# Patient Record
Sex: Male | Born: 1945 | Race: White | Hispanic: No | Marital: Married | Smoking: Former smoker
Health system: Southern US, Community
[De-identification: ages and names within clinical notes are randomized; demographics above are authoritative.]

## PROBLEM LIST (undated history)

## (undated) DIAGNOSIS — T8859XA Other complications of anesthesia, initial encounter: Secondary | ICD-10-CM

## (undated) DIAGNOSIS — C679 Malignant neoplasm of bladder, unspecified: Secondary | ICD-10-CM

## (undated) DIAGNOSIS — G629 Polyneuropathy, unspecified: Secondary | ICD-10-CM

## (undated) DIAGNOSIS — E119 Type 2 diabetes mellitus without complications: Secondary | ICD-10-CM

## (undated) DIAGNOSIS — E7800 Pure hypercholesterolemia, unspecified: Secondary | ICD-10-CM

## (undated) DIAGNOSIS — M1990 Unspecified osteoarthritis, unspecified site: Secondary | ICD-10-CM

## (undated) DIAGNOSIS — Z87442 Personal history of urinary calculi: Secondary | ICD-10-CM

## (undated) DIAGNOSIS — I82409 Acute embolism and thrombosis of unspecified deep veins of unspecified lower extremity: Secondary | ICD-10-CM

## (undated) DIAGNOSIS — I10 Essential (primary) hypertension: Secondary | ICD-10-CM

## (undated) HISTORY — PX: KNEE ARTHROSCOPY: SUR90

## (undated) HISTORY — PX: CHOLECYSTECTOMY: SHX55

## (undated) HISTORY — PX: BLADDER SURGERY: SHX569

## (undated) HISTORY — PX: TONSILLECTOMY: SUR1361

## (undated) HISTORY — DX: Malignant neoplasm of bladder, unspecified: C67.9

## (undated) HISTORY — DX: Pure hypercholesterolemia, unspecified: E78.00

## (undated) HISTORY — DX: Essential (primary) hypertension: I10

## (undated) HISTORY — DX: Type 2 diabetes mellitus without complications: E11.9

## (undated) HISTORY — PX: OTHER SURGICAL HISTORY: SHX169

---

## 2003-08-28 ENCOUNTER — Other Ambulatory Visit: Admission: RE | Admit: 2003-08-28 | Discharge: 2003-08-28 | Payer: Self-pay | Admitting: Unknown Physician Specialty

## 2015-03-19 DIAGNOSIS — M9901 Segmental and somatic dysfunction of cervical region: Secondary | ICD-10-CM | POA: Diagnosis not present

## 2015-03-19 DIAGNOSIS — M9902 Segmental and somatic dysfunction of thoracic region: Secondary | ICD-10-CM | POA: Diagnosis not present

## 2015-03-19 DIAGNOSIS — M5441 Lumbago with sciatica, right side: Secondary | ICD-10-CM | POA: Diagnosis not present

## 2015-03-19 DIAGNOSIS — M4004 Postural kyphosis, thoracic region: Secondary | ICD-10-CM | POA: Diagnosis not present

## 2015-03-19 DIAGNOSIS — M5136 Other intervertebral disc degeneration, lumbar region: Secondary | ICD-10-CM | POA: Diagnosis not present

## 2015-03-19 DIAGNOSIS — M47812 Spondylosis without myelopathy or radiculopathy, cervical region: Secondary | ICD-10-CM | POA: Diagnosis not present

## 2015-03-19 DIAGNOSIS — M9903 Segmental and somatic dysfunction of lumbar region: Secondary | ICD-10-CM | POA: Diagnosis not present

## 2015-03-19 DIAGNOSIS — S39012A Strain of muscle, fascia and tendon of lower back, initial encounter: Secondary | ICD-10-CM | POA: Diagnosis not present

## 2015-03-19 DIAGNOSIS — M47816 Spondylosis without myelopathy or radiculopathy, lumbar region: Secondary | ICD-10-CM | POA: Diagnosis not present

## 2015-03-19 DIAGNOSIS — M546 Pain in thoracic spine: Secondary | ICD-10-CM | POA: Diagnosis not present

## 2015-03-19 DIAGNOSIS — M5442 Lumbago with sciatica, left side: Secondary | ICD-10-CM | POA: Diagnosis not present

## 2015-03-20 DIAGNOSIS — N401 Enlarged prostate with lower urinary tract symptoms: Secondary | ICD-10-CM | POA: Diagnosis not present

## 2015-03-20 DIAGNOSIS — R319 Hematuria, unspecified: Secondary | ICD-10-CM | POA: Diagnosis not present

## 2015-03-20 DIAGNOSIS — Z8551 Personal history of malignant neoplasm of bladder: Secondary | ICD-10-CM | POA: Diagnosis not present

## 2015-03-20 DIAGNOSIS — Z87442 Personal history of urinary calculi: Secondary | ICD-10-CM | POA: Diagnosis not present

## 2015-03-20 DIAGNOSIS — C671 Malignant neoplasm of dome of bladder: Secondary | ICD-10-CM | POA: Diagnosis not present

## 2015-03-20 DIAGNOSIS — N528 Other male erectile dysfunction: Secondary | ICD-10-CM | POA: Diagnosis not present

## 2015-03-20 DIAGNOSIS — R3129 Other microscopic hematuria: Secondary | ICD-10-CM | POA: Diagnosis not present

## 2015-03-24 DIAGNOSIS — R3129 Other microscopic hematuria: Secondary | ICD-10-CM | POA: Diagnosis not present

## 2015-03-26 DIAGNOSIS — C679 Malignant neoplasm of bladder, unspecified: Secondary | ICD-10-CM | POA: Diagnosis not present

## 2015-03-31 DIAGNOSIS — R3129 Other microscopic hematuria: Secondary | ICD-10-CM | POA: Diagnosis not present

## 2015-03-31 DIAGNOSIS — C671 Malignant neoplasm of dome of bladder: Secondary | ICD-10-CM | POA: Diagnosis not present

## 2015-04-02 DIAGNOSIS — C679 Malignant neoplasm of bladder, unspecified: Secondary | ICD-10-CM | POA: Diagnosis not present

## 2015-04-07 DIAGNOSIS — R3129 Other microscopic hematuria: Secondary | ICD-10-CM | POA: Diagnosis not present

## 2015-04-07 DIAGNOSIS — C679 Malignant neoplasm of bladder, unspecified: Secondary | ICD-10-CM | POA: Diagnosis not present

## 2015-04-09 DIAGNOSIS — C679 Malignant neoplasm of bladder, unspecified: Secondary | ICD-10-CM | POA: Diagnosis not present

## 2015-04-13 DIAGNOSIS — G4700 Insomnia, unspecified: Secondary | ICD-10-CM | POA: Diagnosis not present

## 2015-04-13 DIAGNOSIS — Z789 Other specified health status: Secondary | ICD-10-CM | POA: Diagnosis not present

## 2015-04-13 DIAGNOSIS — Z6829 Body mass index (BMI) 29.0-29.9, adult: Secondary | ICD-10-CM | POA: Diagnosis not present

## 2015-04-13 DIAGNOSIS — E1165 Type 2 diabetes mellitus with hyperglycemia: Secondary | ICD-10-CM | POA: Diagnosis not present

## 2015-05-18 DIAGNOSIS — M79672 Pain in left foot: Secondary | ICD-10-CM | POA: Diagnosis not present

## 2015-05-18 DIAGNOSIS — M25579 Pain in unspecified ankle and joints of unspecified foot: Secondary | ICD-10-CM | POA: Diagnosis not present

## 2015-06-15 DIAGNOSIS — M25579 Pain in unspecified ankle and joints of unspecified foot: Secondary | ICD-10-CM | POA: Diagnosis not present

## 2015-06-15 DIAGNOSIS — M79672 Pain in left foot: Secondary | ICD-10-CM | POA: Diagnosis not present

## 2015-07-07 DIAGNOSIS — R3129 Other microscopic hematuria: Secondary | ICD-10-CM | POA: Diagnosis not present

## 2015-07-07 DIAGNOSIS — N401 Enlarged prostate with lower urinary tract symptoms: Secondary | ICD-10-CM | POA: Diagnosis not present

## 2015-07-07 DIAGNOSIS — C671 Malignant neoplasm of dome of bladder: Secondary | ICD-10-CM | POA: Diagnosis not present

## 2015-07-07 DIAGNOSIS — N528 Other male erectile dysfunction: Secondary | ICD-10-CM | POA: Diagnosis not present

## 2015-07-07 DIAGNOSIS — R8290 Unspecified abnormal findings in urine: Secondary | ICD-10-CM | POA: Diagnosis not present

## 2015-07-20 DIAGNOSIS — M25579 Pain in unspecified ankle and joints of unspecified foot: Secondary | ICD-10-CM | POA: Diagnosis not present

## 2015-07-20 DIAGNOSIS — M79672 Pain in left foot: Secondary | ICD-10-CM | POA: Diagnosis not present

## 2015-07-27 DIAGNOSIS — E1165 Type 2 diabetes mellitus with hyperglycemia: Secondary | ICD-10-CM | POA: Diagnosis not present

## 2015-07-27 DIAGNOSIS — Z299 Encounter for prophylactic measures, unspecified: Secondary | ICD-10-CM | POA: Diagnosis not present

## 2015-07-27 DIAGNOSIS — G2581 Restless legs syndrome: Secondary | ICD-10-CM | POA: Diagnosis not present

## 2015-09-21 DIAGNOSIS — M25579 Pain in unspecified ankle and joints of unspecified foot: Secondary | ICD-10-CM | POA: Diagnosis not present

## 2015-09-21 DIAGNOSIS — M79672 Pain in left foot: Secondary | ICD-10-CM | POA: Diagnosis not present

## 2015-10-07 DIAGNOSIS — L820 Inflamed seborrheic keratosis: Secondary | ICD-10-CM | POA: Diagnosis not present

## 2015-10-07 DIAGNOSIS — L570 Actinic keratosis: Secondary | ICD-10-CM | POA: Diagnosis not present

## 2015-10-07 DIAGNOSIS — Z1283 Encounter for screening for malignant neoplasm of skin: Secondary | ICD-10-CM | POA: Diagnosis not present

## 2015-10-07 DIAGNOSIS — D239 Other benign neoplasm of skin, unspecified: Secondary | ICD-10-CM | POA: Diagnosis not present

## 2015-10-20 DIAGNOSIS — I10 Essential (primary) hypertension: Secondary | ICD-10-CM | POA: Diagnosis not present

## 2015-10-20 DIAGNOSIS — E119 Type 2 diabetes mellitus without complications: Secondary | ICD-10-CM | POA: Diagnosis not present

## 2015-10-21 DIAGNOSIS — E119 Type 2 diabetes mellitus without complications: Secondary | ICD-10-CM | POA: Diagnosis not present

## 2015-10-21 DIAGNOSIS — H2513 Age-related nuclear cataract, bilateral: Secondary | ICD-10-CM | POA: Diagnosis not present

## 2015-11-02 DIAGNOSIS — C679 Malignant neoplasm of bladder, unspecified: Secondary | ICD-10-CM | POA: Diagnosis not present

## 2015-11-02 DIAGNOSIS — E1165 Type 2 diabetes mellitus with hyperglycemia: Secondary | ICD-10-CM | POA: Diagnosis not present

## 2015-11-02 DIAGNOSIS — G2581 Restless legs syndrome: Secondary | ICD-10-CM | POA: Diagnosis not present

## 2015-11-17 DIAGNOSIS — M546 Pain in thoracic spine: Secondary | ICD-10-CM | POA: Diagnosis not present

## 2015-11-17 DIAGNOSIS — M9901 Segmental and somatic dysfunction of cervical region: Secondary | ICD-10-CM | POA: Diagnosis not present

## 2015-11-17 DIAGNOSIS — M5136 Other intervertebral disc degeneration, lumbar region: Secondary | ICD-10-CM | POA: Diagnosis not present

## 2015-11-17 DIAGNOSIS — M5441 Lumbago with sciatica, right side: Secondary | ICD-10-CM | POA: Diagnosis not present

## 2015-11-17 DIAGNOSIS — S39012A Strain of muscle, fascia and tendon of lower back, initial encounter: Secondary | ICD-10-CM | POA: Diagnosis not present

## 2015-11-17 DIAGNOSIS — M5442 Lumbago with sciatica, left side: Secondary | ICD-10-CM | POA: Diagnosis not present

## 2015-11-17 DIAGNOSIS — M47816 Spondylosis without myelopathy or radiculopathy, lumbar region: Secondary | ICD-10-CM | POA: Diagnosis not present

## 2015-11-17 DIAGNOSIS — M9903 Segmental and somatic dysfunction of lumbar region: Secondary | ICD-10-CM | POA: Diagnosis not present

## 2015-11-17 DIAGNOSIS — M47812 Spondylosis without myelopathy or radiculopathy, cervical region: Secondary | ICD-10-CM | POA: Diagnosis not present

## 2015-11-17 DIAGNOSIS — M4004 Postural kyphosis, thoracic region: Secondary | ICD-10-CM | POA: Diagnosis not present

## 2015-11-17 DIAGNOSIS — M9902 Segmental and somatic dysfunction of thoracic region: Secondary | ICD-10-CM | POA: Diagnosis not present

## 2015-11-18 DIAGNOSIS — M9901 Segmental and somatic dysfunction of cervical region: Secondary | ICD-10-CM | POA: Diagnosis not present

## 2015-11-18 DIAGNOSIS — M9902 Segmental and somatic dysfunction of thoracic region: Secondary | ICD-10-CM | POA: Diagnosis not present

## 2015-11-18 DIAGNOSIS — M5441 Lumbago with sciatica, right side: Secondary | ICD-10-CM | POA: Diagnosis not present

## 2015-11-18 DIAGNOSIS — S39012A Strain of muscle, fascia and tendon of lower back, initial encounter: Secondary | ICD-10-CM | POA: Diagnosis not present

## 2015-11-18 DIAGNOSIS — M47816 Spondylosis without myelopathy or radiculopathy, lumbar region: Secondary | ICD-10-CM | POA: Diagnosis not present

## 2015-11-18 DIAGNOSIS — M9903 Segmental and somatic dysfunction of lumbar region: Secondary | ICD-10-CM | POA: Diagnosis not present

## 2015-11-18 DIAGNOSIS — M5442 Lumbago with sciatica, left side: Secondary | ICD-10-CM | POA: Diagnosis not present

## 2015-11-18 DIAGNOSIS — M47812 Spondylosis without myelopathy or radiculopathy, cervical region: Secondary | ICD-10-CM | POA: Diagnosis not present

## 2015-11-18 DIAGNOSIS — M4004 Postural kyphosis, thoracic region: Secondary | ICD-10-CM | POA: Diagnosis not present

## 2015-11-18 DIAGNOSIS — M5136 Other intervertebral disc degeneration, lumbar region: Secondary | ICD-10-CM | POA: Diagnosis not present

## 2015-11-18 DIAGNOSIS — M546 Pain in thoracic spine: Secondary | ICD-10-CM | POA: Diagnosis not present

## 2015-11-20 DIAGNOSIS — M9903 Segmental and somatic dysfunction of lumbar region: Secondary | ICD-10-CM | POA: Diagnosis not present

## 2015-11-20 DIAGNOSIS — M5136 Other intervertebral disc degeneration, lumbar region: Secondary | ICD-10-CM | POA: Diagnosis not present

## 2015-11-20 DIAGNOSIS — M47812 Spondylosis without myelopathy or radiculopathy, cervical region: Secondary | ICD-10-CM | POA: Diagnosis not present

## 2015-11-20 DIAGNOSIS — M5442 Lumbago with sciatica, left side: Secondary | ICD-10-CM | POA: Diagnosis not present

## 2015-11-20 DIAGNOSIS — M47816 Spondylosis without myelopathy or radiculopathy, lumbar region: Secondary | ICD-10-CM | POA: Diagnosis not present

## 2015-11-20 DIAGNOSIS — M5441 Lumbago with sciatica, right side: Secondary | ICD-10-CM | POA: Diagnosis not present

## 2015-11-20 DIAGNOSIS — M546 Pain in thoracic spine: Secondary | ICD-10-CM | POA: Diagnosis not present

## 2015-11-20 DIAGNOSIS — M9902 Segmental and somatic dysfunction of thoracic region: Secondary | ICD-10-CM | POA: Diagnosis not present

## 2015-11-20 DIAGNOSIS — M9901 Segmental and somatic dysfunction of cervical region: Secondary | ICD-10-CM | POA: Diagnosis not present

## 2015-11-20 DIAGNOSIS — S39012A Strain of muscle, fascia and tendon of lower back, initial encounter: Secondary | ICD-10-CM | POA: Diagnosis not present

## 2015-11-20 DIAGNOSIS — M4004 Postural kyphosis, thoracic region: Secondary | ICD-10-CM | POA: Diagnosis not present

## 2015-11-27 DIAGNOSIS — M47812 Spondylosis without myelopathy or radiculopathy, cervical region: Secondary | ICD-10-CM | POA: Diagnosis not present

## 2015-11-27 DIAGNOSIS — M546 Pain in thoracic spine: Secondary | ICD-10-CM | POA: Diagnosis not present

## 2015-11-27 DIAGNOSIS — M9902 Segmental and somatic dysfunction of thoracic region: Secondary | ICD-10-CM | POA: Diagnosis not present

## 2015-11-27 DIAGNOSIS — M47816 Spondylosis without myelopathy or radiculopathy, lumbar region: Secondary | ICD-10-CM | POA: Diagnosis not present

## 2015-11-27 DIAGNOSIS — M9903 Segmental and somatic dysfunction of lumbar region: Secondary | ICD-10-CM | POA: Diagnosis not present

## 2015-11-27 DIAGNOSIS — M5442 Lumbago with sciatica, left side: Secondary | ICD-10-CM | POA: Diagnosis not present

## 2015-11-27 DIAGNOSIS — M5441 Lumbago with sciatica, right side: Secondary | ICD-10-CM | POA: Diagnosis not present

## 2015-11-27 DIAGNOSIS — M5136 Other intervertebral disc degeneration, lumbar region: Secondary | ICD-10-CM | POA: Diagnosis not present

## 2015-11-27 DIAGNOSIS — S39012A Strain of muscle, fascia and tendon of lower back, initial encounter: Secondary | ICD-10-CM | POA: Diagnosis not present

## 2015-11-27 DIAGNOSIS — M4004 Postural kyphosis, thoracic region: Secondary | ICD-10-CM | POA: Diagnosis not present

## 2015-11-27 DIAGNOSIS — M9901 Segmental and somatic dysfunction of cervical region: Secondary | ICD-10-CM | POA: Diagnosis not present

## 2015-12-15 DIAGNOSIS — M25579 Pain in unspecified ankle and joints of unspecified foot: Secondary | ICD-10-CM | POA: Diagnosis not present

## 2015-12-15 DIAGNOSIS — M79672 Pain in left foot: Secondary | ICD-10-CM | POA: Diagnosis not present

## 2016-01-04 DIAGNOSIS — I10 Essential (primary) hypertension: Secondary | ICD-10-CM | POA: Diagnosis not present

## 2016-01-04 DIAGNOSIS — E119 Type 2 diabetes mellitus without complications: Secondary | ICD-10-CM | POA: Diagnosis not present

## 2016-01-08 DIAGNOSIS — N528 Other male erectile dysfunction: Secondary | ICD-10-CM | POA: Diagnosis not present

## 2016-01-08 DIAGNOSIS — N401 Enlarged prostate with lower urinary tract symptoms: Secondary | ICD-10-CM | POA: Diagnosis not present

## 2016-01-08 DIAGNOSIS — C671 Malignant neoplasm of dome of bladder: Secondary | ICD-10-CM | POA: Diagnosis not present

## 2016-01-08 DIAGNOSIS — N200 Calculus of kidney: Secondary | ICD-10-CM | POA: Diagnosis not present

## 2016-01-08 DIAGNOSIS — R3129 Other microscopic hematuria: Secondary | ICD-10-CM | POA: Diagnosis not present

## 2016-01-08 DIAGNOSIS — R351 Nocturia: Secondary | ICD-10-CM | POA: Diagnosis not present

## 2016-02-12 DIAGNOSIS — Z6828 Body mass index (BMI) 28.0-28.9, adult: Secondary | ICD-10-CM | POA: Diagnosis not present

## 2016-02-12 DIAGNOSIS — Z299 Encounter for prophylactic measures, unspecified: Secondary | ICD-10-CM | POA: Diagnosis not present

## 2016-02-12 DIAGNOSIS — Z713 Dietary counseling and surveillance: Secondary | ICD-10-CM | POA: Diagnosis not present

## 2016-02-12 DIAGNOSIS — E1165 Type 2 diabetes mellitus with hyperglycemia: Secondary | ICD-10-CM | POA: Diagnosis not present

## 2016-02-12 DIAGNOSIS — I10 Essential (primary) hypertension: Secondary | ICD-10-CM | POA: Diagnosis not present

## 2016-02-23 DIAGNOSIS — M79672 Pain in left foot: Secondary | ICD-10-CM | POA: Diagnosis not present

## 2016-02-23 DIAGNOSIS — M25579 Pain in unspecified ankle and joints of unspecified foot: Secondary | ICD-10-CM | POA: Diagnosis not present

## 2016-03-10 DIAGNOSIS — E119 Type 2 diabetes mellitus without complications: Secondary | ICD-10-CM | POA: Diagnosis not present

## 2016-03-10 DIAGNOSIS — I10 Essential (primary) hypertension: Secondary | ICD-10-CM | POA: Diagnosis not present

## 2016-04-14 DIAGNOSIS — I10 Essential (primary) hypertension: Secondary | ICD-10-CM | POA: Diagnosis not present

## 2016-04-14 DIAGNOSIS — E119 Type 2 diabetes mellitus without complications: Secondary | ICD-10-CM | POA: Diagnosis not present

## 2016-05-26 DIAGNOSIS — Z1389 Encounter for screening for other disorder: Secondary | ICD-10-CM | POA: Diagnosis not present

## 2016-05-26 DIAGNOSIS — R5383 Other fatigue: Secondary | ICD-10-CM | POA: Diagnosis not present

## 2016-05-26 DIAGNOSIS — E7800 Pure hypercholesterolemia, unspecified: Secondary | ICD-10-CM | POA: Diagnosis not present

## 2016-05-26 DIAGNOSIS — Z79899 Other long term (current) drug therapy: Secondary | ICD-10-CM | POA: Diagnosis not present

## 2016-05-26 DIAGNOSIS — Z299 Encounter for prophylactic measures, unspecified: Secondary | ICD-10-CM | POA: Diagnosis not present

## 2016-05-26 DIAGNOSIS — Z7189 Other specified counseling: Secondary | ICD-10-CM | POA: Diagnosis not present

## 2016-05-26 DIAGNOSIS — Z0000 Encounter for general adult medical examination without abnormal findings: Secondary | ICD-10-CM | POA: Diagnosis not present

## 2016-05-26 DIAGNOSIS — I10 Essential (primary) hypertension: Secondary | ICD-10-CM | POA: Diagnosis not present

## 2016-05-26 DIAGNOSIS — E1165 Type 2 diabetes mellitus with hyperglycemia: Secondary | ICD-10-CM | POA: Diagnosis not present

## 2016-05-26 DIAGNOSIS — Z1211 Encounter for screening for malignant neoplasm of colon: Secondary | ICD-10-CM | POA: Diagnosis not present

## 2016-05-26 DIAGNOSIS — R079 Chest pain, unspecified: Secondary | ICD-10-CM | POA: Diagnosis not present

## 2016-05-26 DIAGNOSIS — Z6827 Body mass index (BMI) 27.0-27.9, adult: Secondary | ICD-10-CM | POA: Diagnosis not present

## 2016-05-31 DIAGNOSIS — M531 Cervicobrachial syndrome: Secondary | ICD-10-CM | POA: Diagnosis not present

## 2016-05-31 DIAGNOSIS — M47816 Spondylosis without myelopathy or radiculopathy, lumbar region: Secondary | ICD-10-CM | POA: Diagnosis not present

## 2016-05-31 DIAGNOSIS — M9901 Segmental and somatic dysfunction of cervical region: Secondary | ICD-10-CM | POA: Diagnosis not present

## 2016-05-31 DIAGNOSIS — M9903 Segmental and somatic dysfunction of lumbar region: Secondary | ICD-10-CM | POA: Diagnosis not present

## 2016-05-31 DIAGNOSIS — M4004 Postural kyphosis, thoracic region: Secondary | ICD-10-CM | POA: Diagnosis not present

## 2016-05-31 DIAGNOSIS — M47812 Spondylosis without myelopathy or radiculopathy, cervical region: Secondary | ICD-10-CM | POA: Diagnosis not present

## 2016-05-31 DIAGNOSIS — M9902 Segmental and somatic dysfunction of thoracic region: Secondary | ICD-10-CM | POA: Diagnosis not present

## 2016-06-06 DIAGNOSIS — R079 Chest pain, unspecified: Secondary | ICD-10-CM | POA: Diagnosis not present

## 2016-06-20 DIAGNOSIS — R195 Other fecal abnormalities: Secondary | ICD-10-CM | POA: Diagnosis not present

## 2016-06-20 DIAGNOSIS — Z299 Encounter for prophylactic measures, unspecified: Secondary | ICD-10-CM | POA: Diagnosis not present

## 2016-06-20 DIAGNOSIS — I10 Essential (primary) hypertension: Secondary | ICD-10-CM | POA: Diagnosis not present

## 2016-06-20 DIAGNOSIS — Z6827 Body mass index (BMI) 27.0-27.9, adult: Secondary | ICD-10-CM | POA: Diagnosis not present

## 2016-06-20 DIAGNOSIS — E1165 Type 2 diabetes mellitus with hyperglycemia: Secondary | ICD-10-CM | POA: Diagnosis not present

## 2016-06-20 DIAGNOSIS — Z789 Other specified health status: Secondary | ICD-10-CM | POA: Diagnosis not present

## 2016-06-21 ENCOUNTER — Other Ambulatory Visit (INDEPENDENT_AMBULATORY_CARE_PROVIDER_SITE_OTHER): Payer: Self-pay | Admitting: Internal Medicine

## 2016-06-28 ENCOUNTER — Encounter (INDEPENDENT_AMBULATORY_CARE_PROVIDER_SITE_OTHER): Payer: Self-pay | Admitting: Internal Medicine

## 2016-06-29 ENCOUNTER — Telehealth (INDEPENDENT_AMBULATORY_CARE_PROVIDER_SITE_OTHER): Payer: Self-pay | Admitting: *Deleted

## 2016-06-29 ENCOUNTER — Encounter (INDEPENDENT_AMBULATORY_CARE_PROVIDER_SITE_OTHER): Payer: Self-pay | Admitting: *Deleted

## 2016-06-29 ENCOUNTER — Encounter (INDEPENDENT_AMBULATORY_CARE_PROVIDER_SITE_OTHER): Payer: Self-pay | Admitting: Internal Medicine

## 2016-06-29 ENCOUNTER — Other Ambulatory Visit (INDEPENDENT_AMBULATORY_CARE_PROVIDER_SITE_OTHER): Payer: Self-pay | Admitting: Internal Medicine

## 2016-06-29 ENCOUNTER — Ambulatory Visit (INDEPENDENT_AMBULATORY_CARE_PROVIDER_SITE_OTHER): Payer: Medicare Other | Admitting: Internal Medicine

## 2016-06-29 ENCOUNTER — Encounter (INDEPENDENT_AMBULATORY_CARE_PROVIDER_SITE_OTHER): Payer: Self-pay

## 2016-06-29 VITALS — BP 120/80 | HR 64 | Temp 97.5°F | Ht 70.5 in | Wt 190.5 lb

## 2016-06-29 DIAGNOSIS — C679 Malignant neoplasm of bladder, unspecified: Secondary | ICD-10-CM

## 2016-06-29 DIAGNOSIS — I10 Essential (primary) hypertension: Secondary | ICD-10-CM | POA: Insufficient documentation

## 2016-06-29 DIAGNOSIS — E119 Type 2 diabetes mellitus without complications: Secondary | ICD-10-CM

## 2016-06-29 DIAGNOSIS — R195 Other fecal abnormalities: Secondary | ICD-10-CM

## 2016-06-29 DIAGNOSIS — E7800 Pure hypercholesterolemia, unspecified: Secondary | ICD-10-CM

## 2016-06-29 HISTORY — DX: Malignant neoplasm of bladder, unspecified: C67.9

## 2016-06-29 HISTORY — DX: Essential (primary) hypertension: I10

## 2016-06-29 HISTORY — DX: Pure hypercholesterolemia, unspecified: E78.00

## 2016-06-29 HISTORY — DX: Type 2 diabetes mellitus without complications: E11.9

## 2016-06-29 MED ORDER — PEG 3350-KCL-NA BICARB-NACL 420 G PO SOLR: 4000.0000 mL | Freq: Once | ORAL | 0 refills | Status: AC

## 2016-06-29 NOTE — Telephone Encounter (Signed)
Patient needs trilyte 

## 2016-06-29 NOTE — Progress Notes (Signed)
   Subjective:    Patient ID: Eric [name], male    DOB: 03-12-1946, 71 y.o.   MRN: [id number]  HPI Referred by Dr. Manuella Ghazi for positive stool card. Last colonoscopy in 2011 by Dr. Laural Golden which revealed pancolonic diverticulosis. He denies any blood. No change in his stools. Sometimes his stools are a little darker. Stools are not black.  No family hx of colon cancer. Appetite is good. No weight loss. Usually has a BM daily.  No family hx of colon cancer.    05/27/2016 H and H 13.6 and 39.3  Colonoscopy in 2006 by Dr. Lindalou Hose. Moderate diverticulosis.  No polyps or other neoplasms.  Review of Systems Past Medical History:  Diagnosis Date  . Bladder cancer ([place]) 06/29/2016   2013  . Diabetes ([place]) 06/29/2016  . Essential hypertension, benign 06/29/2016  . High cholesterol 06/29/2016    Past Surgical History:  Procedure Laterality Date  . BLADDER SURGERY     2013  . CHOLECYSTECTOMY    . knee athroscopy      No Known Allergies  Current Outpatient Prescriptions on File Prior to Visit  Medication Sig Dispense Refill  . Cinnamon 500 MG TABS Take 1,000 mg by mouth daily.     . Multiple Vitamins-Minerals (MULTIVITAMIN ADULT PO) Take 1 tablet by mouth daily.    . Potassium 75 MG TABS Take 75 mg by mouth daily.    Marland Kitchen rOPINIRole (REQUIP) 1 MG tablet Take 1 mg by mouth daily.     No current facility-administered medications on file prior to visit.        Objective:   Physical Exam Blood pressure 120/80, pulse 64, temperature 97.5 F (36.4 C), height 5' 10.5" (1.791 m), weight 190 lb 8 oz (86.4 kg).  Alert and oriented. Skin warm and dry. Oral mucosa is moist.   . Sclera anicteric, conjunctivae is pink. Thyroid not enlarged. No cervical lymphadenopathy. Lungs clear. Heart regular rate and rhythm.  Abdomen is soft. Bowel sounds are positive. No hepatomegaly. No abdominal masses felt. No tenderness.  No edema to lower extremities.         Assessment & Plan:  Guaiac positive  stool.  Colonic neoplasm, AVM, polyp, needs to be ruled out.  The risks and benefits such as perforation, bleeding, and infection were reviewed with the patient and is agreeable.

## 2016-06-29 NOTE — Patient Instructions (Signed)
Colonoscopy.  The risks and benefits such as perforation, bleeding, and infection were reviewed with the patient and is agreeable. 

## 2016-07-01 DIAGNOSIS — M9901 Segmental and somatic dysfunction of cervical region: Secondary | ICD-10-CM | POA: Diagnosis not present

## 2016-07-01 DIAGNOSIS — M9902 Segmental and somatic dysfunction of thoracic region: Secondary | ICD-10-CM | POA: Diagnosis not present

## 2016-07-01 DIAGNOSIS — M4004 Postural kyphosis, thoracic region: Secondary | ICD-10-CM | POA: Diagnosis not present

## 2016-07-01 DIAGNOSIS — M47812 Spondylosis without myelopathy or radiculopathy, cervical region: Secondary | ICD-10-CM | POA: Diagnosis not present

## 2016-07-01 DIAGNOSIS — M531 Cervicobrachial syndrome: Secondary | ICD-10-CM | POA: Diagnosis not present

## 2016-07-01 DIAGNOSIS — M47816 Spondylosis without myelopathy or radiculopathy, lumbar region: Secondary | ICD-10-CM | POA: Diagnosis not present

## 2016-07-01 DIAGNOSIS — M9903 Segmental and somatic dysfunction of lumbar region: Secondary | ICD-10-CM | POA: Diagnosis not present

## 2016-07-04 ENCOUNTER — Encounter (INDEPENDENT_AMBULATORY_CARE_PROVIDER_SITE_OTHER): Payer: Self-pay

## 2016-07-06 DIAGNOSIS — I10 Essential (primary) hypertension: Secondary | ICD-10-CM | POA: Diagnosis not present

## 2016-07-06 DIAGNOSIS — E119 Type 2 diabetes mellitus without complications: Secondary | ICD-10-CM | POA: Diagnosis not present

## 2016-07-08 DIAGNOSIS — N401 Enlarged prostate with lower urinary tract symptoms: Secondary | ICD-10-CM | POA: Diagnosis not present

## 2016-07-08 DIAGNOSIS — R3129 Other microscopic hematuria: Secondary | ICD-10-CM | POA: Diagnosis not present

## 2016-07-08 DIAGNOSIS — R351 Nocturia: Secondary | ICD-10-CM | POA: Diagnosis not present

## 2016-07-08 DIAGNOSIS — C671 Malignant neoplasm of dome of bladder: Secondary | ICD-10-CM | POA: Diagnosis not present

## 2016-07-08 DIAGNOSIS — N528 Other male erectile dysfunction: Secondary | ICD-10-CM | POA: Diagnosis not present

## 2016-07-08 DIAGNOSIS — N200 Calculus of kidney: Secondary | ICD-10-CM | POA: Diagnosis not present

## 2016-07-21 DIAGNOSIS — N401 Enlarged prostate with lower urinary tract symptoms: Secondary | ICD-10-CM | POA: Diagnosis not present

## 2016-07-21 DIAGNOSIS — C671 Malignant neoplasm of dome of bladder: Secondary | ICD-10-CM | POA: Diagnosis not present

## 2016-07-29 DIAGNOSIS — E119 Type 2 diabetes mellitus without complications: Secondary | ICD-10-CM | POA: Diagnosis not present

## 2016-07-29 DIAGNOSIS — I10 Essential (primary) hypertension: Secondary | ICD-10-CM | POA: Diagnosis not present

## 2016-09-01 DIAGNOSIS — E7800 Pure hypercholesterolemia, unspecified: Secondary | ICD-10-CM | POA: Diagnosis not present

## 2016-09-01 DIAGNOSIS — C679 Malignant neoplasm of bladder, unspecified: Secondary | ICD-10-CM | POA: Diagnosis not present

## 2016-09-01 DIAGNOSIS — Z299 Encounter for prophylactic measures, unspecified: Secondary | ICD-10-CM | POA: Diagnosis not present

## 2016-09-01 DIAGNOSIS — Z6826 Body mass index (BMI) 26.0-26.9, adult: Secondary | ICD-10-CM | POA: Diagnosis not present

## 2016-09-01 DIAGNOSIS — G4700 Insomnia, unspecified: Secondary | ICD-10-CM | POA: Diagnosis not present

## 2016-09-01 DIAGNOSIS — E1165 Type 2 diabetes mellitus with hyperglycemia: Secondary | ICD-10-CM | POA: Diagnosis not present

## 2016-09-01 DIAGNOSIS — N400 Enlarged prostate without lower urinary tract symptoms: Secondary | ICD-10-CM | POA: Diagnosis not present

## 2016-09-01 DIAGNOSIS — G2581 Restless legs syndrome: Secondary | ICD-10-CM | POA: Diagnosis not present

## 2016-09-01 DIAGNOSIS — Z713 Dietary counseling and surveillance: Secondary | ICD-10-CM | POA: Diagnosis not present

## 2016-09-01 DIAGNOSIS — I10 Essential (primary) hypertension: Secondary | ICD-10-CM | POA: Diagnosis not present

## 2016-09-19 DIAGNOSIS — M79672 Pain in left foot: Secondary | ICD-10-CM | POA: Diagnosis not present

## 2016-09-19 DIAGNOSIS — M79675 Pain in left toe(s): Secondary | ICD-10-CM | POA: Diagnosis not present

## 2016-09-19 DIAGNOSIS — M25579 Pain in unspecified ankle and joints of unspecified foot: Secondary | ICD-10-CM | POA: Diagnosis not present

## 2016-09-29 ENCOUNTER — Ambulatory Visit (HOSPITAL_COMMUNITY)
Admission: RE | Admit: 2016-09-29 | Discharge: 2016-09-29 | Disposition: A | Discharge status: Home / Self Care | Payer: Medicare Other | Source: Ambulatory Visit | Attending: Internal Medicine | Admitting: Internal Medicine

## 2016-09-29 ENCOUNTER — Encounter (HOSPITAL_COMMUNITY)
Admission: RE | Disposition: A | Discharge status: Home / Self Care | Payer: Self-pay | Source: Ambulatory Visit | Attending: Internal Medicine

## 2016-09-29 ENCOUNTER — Encounter (HOSPITAL_COMMUNITY): Payer: Self-pay | Admitting: *Deleted

## 2016-09-29 DIAGNOSIS — K644 Residual hemorrhoidal skin tags: Secondary | ICD-10-CM | POA: Insufficient documentation

## 2016-09-29 DIAGNOSIS — R195 Other fecal abnormalities: Secondary | ICD-10-CM | POA: Insufficient documentation

## 2016-09-29 DIAGNOSIS — K633 Ulcer of intestine: Secondary | ICD-10-CM | POA: Diagnosis not present

## 2016-09-29 DIAGNOSIS — Z87442 Personal history of urinary calculi: Secondary | ICD-10-CM | POA: Insufficient documentation

## 2016-09-29 DIAGNOSIS — Z79899 Other long term (current) drug therapy: Secondary | ICD-10-CM | POA: Diagnosis not present

## 2016-09-29 DIAGNOSIS — Z8551 Personal history of malignant neoplasm of bladder: Secondary | ICD-10-CM | POA: Diagnosis not present

## 2016-09-29 DIAGNOSIS — K5730 Diverticulosis of large intestine without perforation or abscess without bleeding: Secondary | ICD-10-CM | POA: Diagnosis not present

## 2016-09-29 DIAGNOSIS — I10 Essential (primary) hypertension: Secondary | ICD-10-CM | POA: Diagnosis not present

## 2016-09-29 DIAGNOSIS — E7800 Pure hypercholesterolemia, unspecified: Secondary | ICD-10-CM | POA: Diagnosis not present

## 2016-09-29 DIAGNOSIS — E119 Type 2 diabetes mellitus without complications: Secondary | ICD-10-CM | POA: Diagnosis not present

## 2016-09-29 HISTORY — PX: COLONOSCOPY: SHX5424

## 2016-09-29 HISTORY — DX: Personal history of urinary calculi: Z87.442

## 2016-09-29 HISTORY — PX: BIOPSY: SHX5522

## 2016-09-29 LAB — GLUCOSE, CAPILLARY: GLUCOSE-CAPILLARY: 111 mg/dL — AB (ref 65–99)

## 2016-09-29 SURGERY — COLONOSCOPY
Anesthesia: Moderate Sedation

## 2016-09-29 MED ORDER — MEPERIDINE HCL 50 MG/ML IJ SOLN
INTRAMUSCULAR | Status: DC | PRN
Start: 2016-09-29 — End: 2016-09-29
  Administered 2016-09-29 (×2): 25 mg via INTRAVENOUS

## 2016-09-29 MED ORDER — MIDAZOLAM HCL 5 MG/5ML IJ SOLN
INTRAMUSCULAR | Status: AC
  Filled 2016-09-29: qty 10

## 2016-09-29 MED ORDER — MIDAZOLAM HCL 5 MG/5ML IJ SOLN
INTRAMUSCULAR | Status: DC | PRN
  Administered 2016-09-29: 1 mg via INTRAVENOUS
  Administered 2016-09-29 (×2): 2 mg via INTRAVENOUS

## 2016-09-29 MED ORDER — MEPERIDINE HCL 50 MG/ML IJ SOLN
INTRAMUSCULAR | Status: DC
Start: 2016-09-29 — End: 2016-09-29
  Filled 2016-09-29: qty 1

## 2016-09-29 MED ORDER — SODIUM CHLORIDE 0.9 % IV SOLN
INTRAVENOUS | Status: DC
  Administered 2016-09-29: 11:00:00 via INTRAVENOUS

## 2016-09-29 MED ORDER — STERILE WATER FOR IRRIGATION IR SOLN
Status: DC | PRN
  Administered 2016-09-29: 11:00:00

## 2016-09-29 NOTE — H&P (Signed)
Eric [name] is an 71 y.o. male.   Chief Complaint: Patient is here for colonoscopy. HPI: Patient is 71 year old Caucasian male who was recently noted to have heme-positive stool and is therefore here for diagnostic colonoscopy. He he has sporadic blood with a bowel movement in the form of blood in the tissue. He denies frank rectal bleeding melena abdominal pain. He has back pain and takes Aleve on as-needed basis. He takes 2 at a time that he does not take it every day. Last colonoscopy was in May 2011. It was negative for colonic adenomas.  Past Medical History:  Diagnosis Date  . Bladder cancer ([place]) 06/29/2016   2013  . Diabetes ([place]) 06/29/2016  . Essential hypertension, benign 06/29/2016  . High cholesterol 06/29/2016  . History of kidney stones     Past Surgical History:  Procedure Laterality Date  . BLADDER SURGERY     2013  . CHOLECYSTECTOMY    . knee athroscopy      Family History  Problem Relation Age of Onset  . Colon cancer Neg Hx    Social History:  reports that he has never smoked. He has never used smokeless tobacco. He reports that he drinks alcohol. He reports that he does not use drugs.  Allergies: No Known Allergies  Medications Prior to Admission  Medication Sig Dispense Refill  . amLODipine (NORVASC) 5 MG tablet Take 5 mg by mouth daily.    Marland Kitchen CINNAMON PO Take 1,000 mg by mouth daily.    Marland Kitchen dutasteride (AVODART) 0.5 MG capsule Take 0.5 mg by mouth daily.    Marland Kitchen ibuprofen (ADVIL,MOTRIN) 200 MG tablet Take 800 mg by mouth daily as needed for headache or moderate pain.    Marland Kitchen lisinopril (PRINIVIL,ZESTRIL) 20 MG tablet Take 20 mg by mouth daily.    Marland Kitchen lovastatin (MEVACOR) 10 MG tablet Take 10 mg by mouth daily with supper.     . magnesium oxide (MAG-OX) 400 MG tablet Take 800 mg by mouth 2 (two) times daily.     . Multiple Vitamins-Minerals (MULTIVITAMIN ADULT PO) Take 1 tablet by mouth daily.    . naproxen sodium (ANAPROX) 220 MG tablet Take 440 mg by mouth  daily as needed (pain).    . Potassium 75 MG TABS Take 75 mg by mouth daily.     Marland Kitchen rOPINIRole (REQUIP) 1 MG tablet Take 1 mg by mouth at bedtime.       Results for orders placed or performed during the hospital encounter of 09/29/16 (from the past 48 hour(s))  Glucose, capillary     Status: Abnormal   Collection Time: 09/29/16 10:46 AM  Result Value Ref Range   Glucose-Capillary 111 (H) 65 - 99 mg/dL   No results found.  ROS  Blood pressure (!) 157/71, pulse (!) 59, temperature 97.9 F (36.6 C), temperature source Oral, resp. rate 18, height 5\' 10"  (1.778 m), weight 190 lb (86.2 kg), SpO2 100 %. Physical Exam  Constitutional: He appears well-developed and well-nourished.  HENT:  Mouth/Throat: Oropharynx is clear and moist.  Eyes: Conjunctivae are normal. No scleral icterus.  Neck: No thyromegaly present.  Cardiovascular: Normal rate, regular rhythm and normal heart sounds.   No murmur heard. Respiratory: Effort normal and breath sounds normal.  GI: Soft. He exhibits no distension and no mass. There is no tenderness.  Musculoskeletal: He exhibits no edema.  Lymphadenopathy:    He has no cervical adenopathy.  Neurological: He is alert.  Skin: Skin is warm and  dry.     Assessment/Plan Heme-positive stool. Diagnostic colonoscopy.  Hildred Laser, MD 09/29/2016, 11:17 AM

## 2016-09-29 NOTE — Discharge Instructions (Signed)
° °  Gastrointestinal Bleeding Gastrointestinal bleeding is bleeding somewhere along the path food travels through the body (digestive tract). This path is anywhere between the mouth and the opening of the butt (anus). You may have blood in your poop (stools) or have black poop. If you throw up (vomit), there may be blood in it. This condition can be mild, serious, or even life-threatening. If you have a lot of bleeding, you may need to stay in the hospital. Follow these instructions at home:  Take over-the-counter and prescription medicines only as told by your doctor.  Eat foods that have a lot of fiber in them. These foods include whole grains, fruits, and vegetables. You can also try eating 1-3 prunes each day.  Drink enough fluid to keep your pee (urine) clear or pale yellow.  Keep all follow-up visits as told by your doctor. This is important. Contact a doctor if:  Your symptoms do not get better. Get help right away if:  Your bleeding gets worse.  You feel dizzy or you pass out (faint).  You feel weak.  You have very bad cramps in your back or belly (abdomen).  You pass large clumps of blood (clots) in your poop.  Your symptoms are getting worse. This information is not intended to replace advice given to you by your health care provider. Make sure you discuss any questions you have with your health care provider. Document Released: 12/08/2007 Document Revised: 08/06/2015 Document Reviewed: 08/18/2014 Elsevier Interactive Patient Education  2018 [place] or ibuprofen for an milligrams up to 3 times a day as needed and do not take Aleve if possible. Resume other medications and high fiber diet. No driving for 24 hours. Physician will call with biopsy results.

## 2016-09-29 NOTE — Op Note (Signed)
[facility] Patient Name: Eric [name] Procedure Date: 09/29/2016 11:09 AM MRN: [id number] Date of Birth: 05-Aug-1945 Attending MD: Hildred Laser , MD CSN: [id number] Age: 71 Admit Type: Outpatient Procedure:                Colonoscopy Indications:              Heme positive stool Providers:                Hildred Laser, MD, Otis Peak B. Sharon Seller, RN, Bonnetta Barry, Technician Referring MD:             Fuller Canada Manuella Ghazi, MD Medicines:                Meperidine 50 mg IV, Midazolam 5 mg IV Complications:            No immediate complications. Estimated Blood Loss:     Estimated blood loss was minimal. Procedure:                Pre-Anesthesia Assessment:                           - Prior to the procedure, a History and Physical                            was performed, and patient medications and                            allergies were reviewed. The patient's tolerance of                            previous anesthesia was also reviewed. The risks                            and benefits of the procedure and the sedation                            options and risks were discussed with the patient.                            All questions were answered, and informed consent                            was obtained. Prior Anticoagulants: The patient                            last took naproxen 7 days prior to the procedure.                            ASA Grade Assessment: II - A patient with mild                            systemic disease. After reviewing the risks and  benefits, the patient was deemed in satisfactory                            condition to undergo the procedure.                           After obtaining informed consent, the colonoscope                            was passed under direct vision. Throughout the                            procedure, the patient's blood pressure, pulse, and   oxygen saturations were monitored continuously. The                            EC-[id number] ([id number]) scope was introduced through                            the anus and advanced to the the cecum, identified                            by appendiceal orifice and ileocecal valve. The                            colonoscopy was performed without difficulty. The                            patient tolerated the procedure well. The quality                            of the bowel preparation was adequate. The                            ileocecal valve, appendiceal orifice, and rectum                            were photographed. Scope In: 11:29:48 AM Scope Out: 11:53:07 AM Scope Withdrawal Time: 0 hours 10 minutes 8 seconds  Total Procedure Duration: 0 hours 23 minutes 19 seconds  Findings:      The perianal and digital rectal examinations were normal.      Multiple small and large-mouthed diverticula were found in the entire       colon.      A single (solitary) ulcer was found in the distal transverse colon. No       bleeding was present. Biopsies were taken with a cold forceps for       histology.      A few scattered erosions were found in the transverse colon and in the       ascending colon.      External hemorrhoids were found during retroflexion. The hemorrhoids       were small. Impression:               - Diverticulosis in the entire examined colon.                           -  A single (solitary) ulcer in the distal                            transverse colon. Biopsied.                           - A few erosions in the transverse colon and in the                            ascending colon.                           - External hemorrhoids.                           comment: Suspect NSAID induced injury. Moderate Sedation:      Moderate (conscious) sedation was administered by the endoscopy nurse       and supervised by the endoscopist. The following parameters were       monitored:  oxygen saturation, heart rate, blood pressure, CO2       capnography and response to care. Total physician intraservice time was       30 minutes. Recommendation:           - Patient has a contact number available for                            emergencies. The signs and symptoms of potential                            delayed complications were discussed with the                            patient. Return to normal activities tomorrow.                            Written discharge instructions were provided to the                            patient.                           - High fiber diet today.                           - Continue present medications.                           - Await pathology results.                           - No repeat colonoscopy due to the absence of                            advanced adenomas.                           - Use ibuprofen instead of  naproxen. Procedure Code(s):        --- Professional ---                           [phone], Colonoscopy, flexible; with biopsy, single                            or multiple                           99152, Moderate sedation services provided by the                            same physician or other qualified health care                            professional performing the diagnostic or                            therapeutic service that the sedation supports,                            requiring the presence of an independent trained                            observer to assist in the monitoring of the                            patient's level of consciousness and physiological                            status; initial 15 minutes of intraservice time,                            patient age 40 years or older                           [phone], Moderate sedation services; each additional                            15 minutes intraservice time Diagnosis Code(s):        --- Professional ---                            K64.4, Residual hemorrhoidal skin tags                           K63.3, Ulcer of intestine                           R19.5, Other fecal abnormalities                           K57.30, Diverticulosis of large intestine without  perforation or abscess without bleeding CPT copyright 2016 American Medical Association. All rights reserved. The codes documented in this report are preliminary and upon coder review may  be revised to meet current compliance requirements. Hildred Laser, MD Hildred Laser, MD 09/29/2016 12:02:45 PM This report has been signed electronically. Number of Addenda: 0

## 2016-10-04 ENCOUNTER — Encounter (HOSPITAL_COMMUNITY): Payer: Self-pay | Admitting: Internal Medicine

## 2016-10-05 DIAGNOSIS — D239 Other benign neoplasm of skin, unspecified: Secondary | ICD-10-CM | POA: Diagnosis not present

## 2016-10-05 DIAGNOSIS — L720 Epidermal cyst: Secondary | ICD-10-CM | POA: Diagnosis not present

## 2016-10-05 DIAGNOSIS — Z1283 Encounter for screening for malignant neoplasm of skin: Secondary | ICD-10-CM | POA: Diagnosis not present

## 2016-10-10 DIAGNOSIS — M546 Pain in thoracic spine: Secondary | ICD-10-CM | POA: Diagnosis not present

## 2016-10-10 DIAGNOSIS — E119 Type 2 diabetes mellitus without complications: Secondary | ICD-10-CM | POA: Diagnosis not present

## 2016-10-10 DIAGNOSIS — Z79899 Other long term (current) drug therapy: Secondary | ICD-10-CM | POA: Diagnosis not present

## 2016-10-10 DIAGNOSIS — W1781XA Fall down embankment (hill), initial encounter: Secondary | ICD-10-CM | POA: Diagnosis not present

## 2016-10-10 DIAGNOSIS — R52 Pain, unspecified: Secondary | ICD-10-CM | POA: Diagnosis not present

## 2016-10-10 DIAGNOSIS — M542 Cervicalgia: Secondary | ICD-10-CM | POA: Diagnosis not present

## 2016-10-10 DIAGNOSIS — S3992XA Unspecified injury of lower back, initial encounter: Secondary | ICD-10-CM | POA: Diagnosis not present

## 2016-10-10 DIAGNOSIS — M79672 Pain in left foot: Secondary | ICD-10-CM | POA: Diagnosis not present

## 2016-10-10 DIAGNOSIS — M545 Low back pain: Secondary | ICD-10-CM | POA: Diagnosis not present

## 2016-10-10 DIAGNOSIS — Z7984 Long term (current) use of oral hypoglycemic drugs: Secondary | ICD-10-CM | POA: Diagnosis not present

## 2016-10-10 DIAGNOSIS — M25512 Pain in left shoulder: Secondary | ICD-10-CM | POA: Diagnosis not present

## 2016-10-10 DIAGNOSIS — M25579 Pain in unspecified ankle and joints of unspecified foot: Secondary | ICD-10-CM | POA: Diagnosis not present

## 2016-10-10 DIAGNOSIS — I10 Essential (primary) hypertension: Secondary | ICD-10-CM | POA: Diagnosis not present

## 2016-10-11 DIAGNOSIS — M4004 Postural kyphosis, thoracic region: Secondary | ICD-10-CM | POA: Diagnosis not present

## 2016-10-11 DIAGNOSIS — M9902 Segmental and somatic dysfunction of thoracic region: Secondary | ICD-10-CM | POA: Diagnosis not present

## 2016-10-11 DIAGNOSIS — M47816 Spondylosis without myelopathy or radiculopathy, lumbar region: Secondary | ICD-10-CM | POA: Diagnosis not present

## 2016-10-11 DIAGNOSIS — M9901 Segmental and somatic dysfunction of cervical region: Secondary | ICD-10-CM | POA: Diagnosis not present

## 2016-10-11 DIAGNOSIS — M47812 Spondylosis without myelopathy or radiculopathy, cervical region: Secondary | ICD-10-CM | POA: Diagnosis not present

## 2016-10-11 DIAGNOSIS — M531 Cervicobrachial syndrome: Secondary | ICD-10-CM | POA: Diagnosis not present

## 2016-10-11 DIAGNOSIS — M9903 Segmental and somatic dysfunction of lumbar region: Secondary | ICD-10-CM | POA: Diagnosis not present

## 2016-10-13 DIAGNOSIS — M531 Cervicobrachial syndrome: Secondary | ICD-10-CM | POA: Diagnosis not present

## 2016-10-13 DIAGNOSIS — M47812 Spondylosis without myelopathy or radiculopathy, cervical region: Secondary | ICD-10-CM | POA: Diagnosis not present

## 2016-10-13 DIAGNOSIS — M47816 Spondylosis without myelopathy or radiculopathy, lumbar region: Secondary | ICD-10-CM | POA: Diagnosis not present

## 2016-10-13 DIAGNOSIS — M4004 Postural kyphosis, thoracic region: Secondary | ICD-10-CM | POA: Diagnosis not present

## 2016-10-13 DIAGNOSIS — M9901 Segmental and somatic dysfunction of cervical region: Secondary | ICD-10-CM | POA: Diagnosis not present

## 2016-10-13 DIAGNOSIS — M9903 Segmental and somatic dysfunction of lumbar region: Secondary | ICD-10-CM | POA: Diagnosis not present

## 2016-10-13 DIAGNOSIS — M9902 Segmental and somatic dysfunction of thoracic region: Secondary | ICD-10-CM | POA: Diagnosis not present

## 2016-10-17 DIAGNOSIS — M47816 Spondylosis without myelopathy or radiculopathy, lumbar region: Secondary | ICD-10-CM | POA: Diagnosis not present

## 2016-10-17 DIAGNOSIS — M9901 Segmental and somatic dysfunction of cervical region: Secondary | ICD-10-CM | POA: Diagnosis not present

## 2016-10-17 DIAGNOSIS — M47812 Spondylosis without myelopathy or radiculopathy, cervical region: Secondary | ICD-10-CM | POA: Diagnosis not present

## 2016-10-17 DIAGNOSIS — M9902 Segmental and somatic dysfunction of thoracic region: Secondary | ICD-10-CM | POA: Diagnosis not present

## 2016-10-17 DIAGNOSIS — M9903 Segmental and somatic dysfunction of lumbar region: Secondary | ICD-10-CM | POA: Diagnosis not present

## 2016-10-17 DIAGNOSIS — M4004 Postural kyphosis, thoracic region: Secondary | ICD-10-CM | POA: Diagnosis not present

## 2016-10-17 DIAGNOSIS — M531 Cervicobrachial syndrome: Secondary | ICD-10-CM | POA: Diagnosis not present

## 2016-10-20 DIAGNOSIS — M9901 Segmental and somatic dysfunction of cervical region: Secondary | ICD-10-CM | POA: Diagnosis not present

## 2016-10-20 DIAGNOSIS — M531 Cervicobrachial syndrome: Secondary | ICD-10-CM | POA: Diagnosis not present

## 2016-10-20 DIAGNOSIS — M9902 Segmental and somatic dysfunction of thoracic region: Secondary | ICD-10-CM | POA: Diagnosis not present

## 2016-10-20 DIAGNOSIS — M47816 Spondylosis without myelopathy or radiculopathy, lumbar region: Secondary | ICD-10-CM | POA: Diagnosis not present

## 2016-10-20 DIAGNOSIS — M9903 Segmental and somatic dysfunction of lumbar region: Secondary | ICD-10-CM | POA: Diagnosis not present

## 2016-10-20 DIAGNOSIS — M4004 Postural kyphosis, thoracic region: Secondary | ICD-10-CM | POA: Diagnosis not present

## 2016-10-20 DIAGNOSIS — M47812 Spondylosis without myelopathy or radiculopathy, cervical region: Secondary | ICD-10-CM | POA: Diagnosis not present

## 2016-10-24 DIAGNOSIS — M47812 Spondylosis without myelopathy or radiculopathy, cervical region: Secondary | ICD-10-CM | POA: Diagnosis not present

## 2016-10-24 DIAGNOSIS — M47816 Spondylosis without myelopathy or radiculopathy, lumbar region: Secondary | ICD-10-CM | POA: Diagnosis not present

## 2016-10-24 DIAGNOSIS — M9902 Segmental and somatic dysfunction of thoracic region: Secondary | ICD-10-CM | POA: Diagnosis not present

## 2016-10-24 DIAGNOSIS — M9903 Segmental and somatic dysfunction of lumbar region: Secondary | ICD-10-CM | POA: Diagnosis not present

## 2016-10-24 DIAGNOSIS — M4004 Postural kyphosis, thoracic region: Secondary | ICD-10-CM | POA: Diagnosis not present

## 2016-10-24 DIAGNOSIS — M531 Cervicobrachial syndrome: Secondary | ICD-10-CM | POA: Diagnosis not present

## 2016-10-24 DIAGNOSIS — M9901 Segmental and somatic dysfunction of cervical region: Secondary | ICD-10-CM | POA: Diagnosis not present

## 2016-10-27 DIAGNOSIS — M9903 Segmental and somatic dysfunction of lumbar region: Secondary | ICD-10-CM | POA: Diagnosis not present

## 2016-10-27 DIAGNOSIS — M4004 Postural kyphosis, thoracic region: Secondary | ICD-10-CM | POA: Diagnosis not present

## 2016-10-27 DIAGNOSIS — M47812 Spondylosis without myelopathy or radiculopathy, cervical region: Secondary | ICD-10-CM | POA: Diagnosis not present

## 2016-10-27 DIAGNOSIS — M9902 Segmental and somatic dysfunction of thoracic region: Secondary | ICD-10-CM | POA: Diagnosis not present

## 2016-10-27 DIAGNOSIS — M9901 Segmental and somatic dysfunction of cervical region: Secondary | ICD-10-CM | POA: Diagnosis not present

## 2016-10-27 DIAGNOSIS — M47816 Spondylosis without myelopathy or radiculopathy, lumbar region: Secondary | ICD-10-CM | POA: Diagnosis not present

## 2016-10-27 DIAGNOSIS — M531 Cervicobrachial syndrome: Secondary | ICD-10-CM | POA: Diagnosis not present

## 2016-11-02 DIAGNOSIS — M9901 Segmental and somatic dysfunction of cervical region: Secondary | ICD-10-CM | POA: Diagnosis not present

## 2016-11-02 DIAGNOSIS — M9903 Segmental and somatic dysfunction of lumbar region: Secondary | ICD-10-CM | POA: Diagnosis not present

## 2016-11-02 DIAGNOSIS — M4004 Postural kyphosis, thoracic region: Secondary | ICD-10-CM | POA: Diagnosis not present

## 2016-11-02 DIAGNOSIS — M531 Cervicobrachial syndrome: Secondary | ICD-10-CM | POA: Diagnosis not present

## 2016-11-02 DIAGNOSIS — M47816 Spondylosis without myelopathy or radiculopathy, lumbar region: Secondary | ICD-10-CM | POA: Diagnosis not present

## 2016-11-02 DIAGNOSIS — M9902 Segmental and somatic dysfunction of thoracic region: Secondary | ICD-10-CM | POA: Diagnosis not present

## 2016-11-02 DIAGNOSIS — M47812 Spondylosis without myelopathy or radiculopathy, cervical region: Secondary | ICD-10-CM | POA: Diagnosis not present

## 2016-11-08 DIAGNOSIS — M25579 Pain in unspecified ankle and joints of unspecified foot: Secondary | ICD-10-CM | POA: Diagnosis not present

## 2016-11-08 DIAGNOSIS — M79672 Pain in left foot: Secondary | ICD-10-CM | POA: Diagnosis not present

## 2016-11-10 DIAGNOSIS — E119 Type 2 diabetes mellitus without complications: Secondary | ICD-10-CM | POA: Diagnosis not present

## 2016-11-10 DIAGNOSIS — M47812 Spondylosis without myelopathy or radiculopathy, cervical region: Secondary | ICD-10-CM | POA: Diagnosis not present

## 2016-11-10 DIAGNOSIS — M9903 Segmental and somatic dysfunction of lumbar region: Secondary | ICD-10-CM | POA: Diagnosis not present

## 2016-11-10 DIAGNOSIS — I10 Essential (primary) hypertension: Secondary | ICD-10-CM | POA: Diagnosis not present

## 2016-11-10 DIAGNOSIS — M4004 Postural kyphosis, thoracic region: Secondary | ICD-10-CM | POA: Diagnosis not present

## 2016-11-10 DIAGNOSIS — M47816 Spondylosis without myelopathy or radiculopathy, lumbar region: Secondary | ICD-10-CM | POA: Diagnosis not present

## 2016-11-10 DIAGNOSIS — M9902 Segmental and somatic dysfunction of thoracic region: Secondary | ICD-10-CM | POA: Diagnosis not present

## 2016-11-10 DIAGNOSIS — M9901 Segmental and somatic dysfunction of cervical region: Secondary | ICD-10-CM | POA: Diagnosis not present

## 2016-11-10 DIAGNOSIS — M531 Cervicobrachial syndrome: Secondary | ICD-10-CM | POA: Diagnosis not present

## 2016-11-15 DIAGNOSIS — M79672 Pain in left foot: Secondary | ICD-10-CM | POA: Diagnosis not present

## 2016-11-15 DIAGNOSIS — M25579 Pain in unspecified ankle and joints of unspecified foot: Secondary | ICD-10-CM | POA: Diagnosis not present

## 2016-11-17 DIAGNOSIS — M47816 Spondylosis without myelopathy or radiculopathy, lumbar region: Secondary | ICD-10-CM | POA: Diagnosis not present

## 2016-11-17 DIAGNOSIS — M9902 Segmental and somatic dysfunction of thoracic region: Secondary | ICD-10-CM | POA: Diagnosis not present

## 2016-11-17 DIAGNOSIS — M531 Cervicobrachial syndrome: Secondary | ICD-10-CM | POA: Diagnosis not present

## 2016-11-17 DIAGNOSIS — M9903 Segmental and somatic dysfunction of lumbar region: Secondary | ICD-10-CM | POA: Diagnosis not present

## 2016-11-17 DIAGNOSIS — M4004 Postural kyphosis, thoracic region: Secondary | ICD-10-CM | POA: Diagnosis not present

## 2016-11-17 DIAGNOSIS — M9901 Segmental and somatic dysfunction of cervical region: Secondary | ICD-10-CM | POA: Diagnosis not present

## 2016-11-17 DIAGNOSIS — M47812 Spondylosis without myelopathy or radiculopathy, cervical region: Secondary | ICD-10-CM | POA: Diagnosis not present

## 2016-11-24 DIAGNOSIS — M531 Cervicobrachial syndrome: Secondary | ICD-10-CM | POA: Diagnosis not present

## 2016-11-24 DIAGNOSIS — M47816 Spondylosis without myelopathy or radiculopathy, lumbar region: Secondary | ICD-10-CM | POA: Diagnosis not present

## 2016-11-24 DIAGNOSIS — M9901 Segmental and somatic dysfunction of cervical region: Secondary | ICD-10-CM | POA: Diagnosis not present

## 2016-11-24 DIAGNOSIS — M4004 Postural kyphosis, thoracic region: Secondary | ICD-10-CM | POA: Diagnosis not present

## 2016-11-24 DIAGNOSIS — M9903 Segmental and somatic dysfunction of lumbar region: Secondary | ICD-10-CM | POA: Diagnosis not present

## 2016-11-24 DIAGNOSIS — M9902 Segmental and somatic dysfunction of thoracic region: Secondary | ICD-10-CM | POA: Diagnosis not present

## 2016-11-24 DIAGNOSIS — M47812 Spondylosis without myelopathy or radiculopathy, cervical region: Secondary | ICD-10-CM | POA: Diagnosis not present

## 2016-11-28 DIAGNOSIS — C679 Malignant neoplasm of bladder, unspecified: Secondary | ICD-10-CM | POA: Diagnosis not present

## 2016-11-28 DIAGNOSIS — Z6826 Body mass index (BMI) 26.0-26.9, adult: Secondary | ICD-10-CM | POA: Diagnosis not present

## 2016-11-28 DIAGNOSIS — M5432 Sciatica, left side: Secondary | ICD-10-CM | POA: Diagnosis not present

## 2016-11-28 DIAGNOSIS — Z299 Encounter for prophylactic measures, unspecified: Secondary | ICD-10-CM | POA: Diagnosis not present

## 2016-11-28 DIAGNOSIS — I10 Essential (primary) hypertension: Secondary | ICD-10-CM | POA: Diagnosis not present

## 2016-12-01 DIAGNOSIS — M9902 Segmental and somatic dysfunction of thoracic region: Secondary | ICD-10-CM | POA: Diagnosis not present

## 2016-12-01 DIAGNOSIS — M9901 Segmental and somatic dysfunction of cervical region: Secondary | ICD-10-CM | POA: Diagnosis not present

## 2016-12-01 DIAGNOSIS — M9903 Segmental and somatic dysfunction of lumbar region: Secondary | ICD-10-CM | POA: Diagnosis not present

## 2016-12-01 DIAGNOSIS — M4004 Postural kyphosis, thoracic region: Secondary | ICD-10-CM | POA: Diagnosis not present

## 2016-12-01 DIAGNOSIS — M47812 Spondylosis without myelopathy or radiculopathy, cervical region: Secondary | ICD-10-CM | POA: Diagnosis not present

## 2016-12-01 DIAGNOSIS — M531 Cervicobrachial syndrome: Secondary | ICD-10-CM | POA: Diagnosis not present

## 2016-12-01 DIAGNOSIS — M47816 Spondylosis without myelopathy or radiculopathy, lumbar region: Secondary | ICD-10-CM | POA: Diagnosis not present

## 2016-12-08 DIAGNOSIS — M47812 Spondylosis without myelopathy or radiculopathy, cervical region: Secondary | ICD-10-CM | POA: Diagnosis not present

## 2016-12-08 DIAGNOSIS — C679 Malignant neoplasm of bladder, unspecified: Secondary | ICD-10-CM | POA: Diagnosis not present

## 2016-12-08 DIAGNOSIS — Z6826 Body mass index (BMI) 26.0-26.9, adult: Secondary | ICD-10-CM | POA: Diagnosis not present

## 2016-12-08 DIAGNOSIS — M9903 Segmental and somatic dysfunction of lumbar region: Secondary | ICD-10-CM | POA: Diagnosis not present

## 2016-12-08 DIAGNOSIS — M9901 Segmental and somatic dysfunction of cervical region: Secondary | ICD-10-CM | POA: Diagnosis not present

## 2016-12-08 DIAGNOSIS — N400 Enlarged prostate without lower urinary tract symptoms: Secondary | ICD-10-CM | POA: Diagnosis not present

## 2016-12-08 DIAGNOSIS — I10 Essential (primary) hypertension: Secondary | ICD-10-CM | POA: Diagnosis not present

## 2016-12-08 DIAGNOSIS — E1165 Type 2 diabetes mellitus with hyperglycemia: Secondary | ICD-10-CM | POA: Diagnosis not present

## 2016-12-08 DIAGNOSIS — E7800 Pure hypercholesterolemia, unspecified: Secondary | ICD-10-CM | POA: Diagnosis not present

## 2016-12-08 DIAGNOSIS — M4004 Postural kyphosis, thoracic region: Secondary | ICD-10-CM | POA: Diagnosis not present

## 2016-12-08 DIAGNOSIS — M9902 Segmental and somatic dysfunction of thoracic region: Secondary | ICD-10-CM | POA: Diagnosis not present

## 2016-12-08 DIAGNOSIS — M47816 Spondylosis without myelopathy or radiculopathy, lumbar region: Secondary | ICD-10-CM | POA: Diagnosis not present

## 2016-12-08 DIAGNOSIS — G2581 Restless legs syndrome: Secondary | ICD-10-CM | POA: Diagnosis not present

## 2016-12-08 DIAGNOSIS — G4700 Insomnia, unspecified: Secondary | ICD-10-CM | POA: Diagnosis not present

## 2016-12-08 DIAGNOSIS — M531 Cervicobrachial syndrome: Secondary | ICD-10-CM | POA: Diagnosis not present

## 2016-12-08 DIAGNOSIS — Z299 Encounter for prophylactic measures, unspecified: Secondary | ICD-10-CM | POA: Diagnosis not present

## 2016-12-13 DIAGNOSIS — E119 Type 2 diabetes mellitus without complications: Secondary | ICD-10-CM | POA: Diagnosis not present

## 2016-12-13 DIAGNOSIS — H43813 Vitreous degeneration, bilateral: Secondary | ICD-10-CM | POA: Diagnosis not present

## 2016-12-13 DIAGNOSIS — H2513 Age-related nuclear cataract, bilateral: Secondary | ICD-10-CM | POA: Diagnosis not present

## 2016-12-13 DIAGNOSIS — H524 Presbyopia: Secondary | ICD-10-CM | POA: Diagnosis not present

## 2016-12-13 DIAGNOSIS — H5203 Hypermetropia, bilateral: Secondary | ICD-10-CM | POA: Diagnosis not present

## 2016-12-13 DIAGNOSIS — H52223 Regular astigmatism, bilateral: Secondary | ICD-10-CM | POA: Diagnosis not present

## 2016-12-15 DIAGNOSIS — M4004 Postural kyphosis, thoracic region: Secondary | ICD-10-CM | POA: Diagnosis not present

## 2016-12-15 DIAGNOSIS — M47816 Spondylosis without myelopathy or radiculopathy, lumbar region: Secondary | ICD-10-CM | POA: Diagnosis not present

## 2016-12-15 DIAGNOSIS — M9903 Segmental and somatic dysfunction of lumbar region: Secondary | ICD-10-CM | POA: Diagnosis not present

## 2016-12-15 DIAGNOSIS — M531 Cervicobrachial syndrome: Secondary | ICD-10-CM | POA: Diagnosis not present

## 2016-12-15 DIAGNOSIS — M47812 Spondylosis without myelopathy or radiculopathy, cervical region: Secondary | ICD-10-CM | POA: Diagnosis not present

## 2016-12-15 DIAGNOSIS — M9901 Segmental and somatic dysfunction of cervical region: Secondary | ICD-10-CM | POA: Diagnosis not present

## 2016-12-15 DIAGNOSIS — M9902 Segmental and somatic dysfunction of thoracic region: Secondary | ICD-10-CM | POA: Diagnosis not present

## 2016-12-22 DIAGNOSIS — M9903 Segmental and somatic dysfunction of lumbar region: Secondary | ICD-10-CM | POA: Diagnosis not present

## 2016-12-22 DIAGNOSIS — M4004 Postural kyphosis, thoracic region: Secondary | ICD-10-CM | POA: Diagnosis not present

## 2016-12-22 DIAGNOSIS — M9902 Segmental and somatic dysfunction of thoracic region: Secondary | ICD-10-CM | POA: Diagnosis not present

## 2016-12-22 DIAGNOSIS — M531 Cervicobrachial syndrome: Secondary | ICD-10-CM | POA: Diagnosis not present

## 2016-12-22 DIAGNOSIS — M9901 Segmental and somatic dysfunction of cervical region: Secondary | ICD-10-CM | POA: Diagnosis not present

## 2016-12-22 DIAGNOSIS — M47816 Spondylosis without myelopathy or radiculopathy, lumbar region: Secondary | ICD-10-CM | POA: Diagnosis not present

## 2016-12-22 DIAGNOSIS — M47812 Spondylosis without myelopathy or radiculopathy, cervical region: Secondary | ICD-10-CM | POA: Diagnosis not present

## 2016-12-26 DIAGNOSIS — I10 Essential (primary) hypertension: Secondary | ICD-10-CM | POA: Diagnosis not present

## 2016-12-26 DIAGNOSIS — E119 Type 2 diabetes mellitus without complications: Secondary | ICD-10-CM | POA: Diagnosis not present

## 2016-12-29 DIAGNOSIS — M47812 Spondylosis without myelopathy or radiculopathy, cervical region: Secondary | ICD-10-CM | POA: Diagnosis not present

## 2016-12-29 DIAGNOSIS — Z713 Dietary counseling and surveillance: Secondary | ICD-10-CM | POA: Diagnosis not present

## 2016-12-29 DIAGNOSIS — M47816 Spondylosis without myelopathy or radiculopathy, lumbar region: Secondary | ICD-10-CM | POA: Diagnosis not present

## 2016-12-29 DIAGNOSIS — M4004 Postural kyphosis, thoracic region: Secondary | ICD-10-CM | POA: Diagnosis not present

## 2016-12-29 DIAGNOSIS — M79605 Pain in left leg: Secondary | ICD-10-CM | POA: Diagnosis not present

## 2016-12-29 DIAGNOSIS — Z6826 Body mass index (BMI) 26.0-26.9, adult: Secondary | ICD-10-CM | POA: Diagnosis not present

## 2016-12-29 DIAGNOSIS — M9903 Segmental and somatic dysfunction of lumbar region: Secondary | ICD-10-CM | POA: Diagnosis not present

## 2016-12-29 DIAGNOSIS — I10 Essential (primary) hypertension: Secondary | ICD-10-CM | POA: Diagnosis not present

## 2016-12-29 DIAGNOSIS — M9901 Segmental and somatic dysfunction of cervical region: Secondary | ICD-10-CM | POA: Diagnosis not present

## 2016-12-29 DIAGNOSIS — Z299 Encounter for prophylactic measures, unspecified: Secondary | ICD-10-CM | POA: Diagnosis not present

## 2016-12-29 DIAGNOSIS — M9902 Segmental and somatic dysfunction of thoracic region: Secondary | ICD-10-CM | POA: Diagnosis not present

## 2016-12-29 DIAGNOSIS — M531 Cervicobrachial syndrome: Secondary | ICD-10-CM | POA: Diagnosis not present

## 2017-01-12 DIAGNOSIS — M4004 Postural kyphosis, thoracic region: Secondary | ICD-10-CM | POA: Diagnosis not present

## 2017-01-12 DIAGNOSIS — M531 Cervicobrachial syndrome: Secondary | ICD-10-CM | POA: Diagnosis not present

## 2017-01-12 DIAGNOSIS — M9902 Segmental and somatic dysfunction of thoracic region: Secondary | ICD-10-CM | POA: Diagnosis not present

## 2017-01-12 DIAGNOSIS — M9901 Segmental and somatic dysfunction of cervical region: Secondary | ICD-10-CM | POA: Diagnosis not present

## 2017-01-12 DIAGNOSIS — M47816 Spondylosis without myelopathy or radiculopathy, lumbar region: Secondary | ICD-10-CM | POA: Diagnosis not present

## 2017-01-12 DIAGNOSIS — M9903 Segmental and somatic dysfunction of lumbar region: Secondary | ICD-10-CM | POA: Diagnosis not present

## 2017-01-12 DIAGNOSIS — M47812 Spondylosis without myelopathy or radiculopathy, cervical region: Secondary | ICD-10-CM | POA: Diagnosis not present

## 2017-01-31 DIAGNOSIS — R351 Nocturia: Secondary | ICD-10-CM | POA: Diagnosis not present

## 2017-01-31 DIAGNOSIS — N200 Calculus of kidney: Secondary | ICD-10-CM | POA: Diagnosis not present

## 2017-01-31 DIAGNOSIS — Z8551 Personal history of malignant neoplasm of bladder: Secondary | ICD-10-CM | POA: Diagnosis not present

## 2017-01-31 DIAGNOSIS — N401 Enlarged prostate with lower urinary tract symptoms: Secondary | ICD-10-CM | POA: Diagnosis not present

## 2017-01-31 DIAGNOSIS — R3129 Other microscopic hematuria: Secondary | ICD-10-CM | POA: Diagnosis not present

## 2017-02-13 DIAGNOSIS — Z01810 Encounter for preprocedural cardiovascular examination: Secondary | ICD-10-CM | POA: Diagnosis not present

## 2017-02-13 DIAGNOSIS — N200 Calculus of kidney: Secondary | ICD-10-CM | POA: Diagnosis not present

## 2017-02-13 DIAGNOSIS — C679 Malignant neoplasm of bladder, unspecified: Secondary | ICD-10-CM | POA: Diagnosis not present

## 2017-02-13 DIAGNOSIS — E119 Type 2 diabetes mellitus without complications: Secondary | ICD-10-CM | POA: Diagnosis not present

## 2017-02-13 DIAGNOSIS — N401 Enlarged prostate with lower urinary tract symptoms: Secondary | ICD-10-CM | POA: Diagnosis not present

## 2017-02-13 DIAGNOSIS — I10 Essential (primary) hypertension: Secondary | ICD-10-CM | POA: Diagnosis not present

## 2017-02-13 DIAGNOSIS — Z79899 Other long term (current) drug therapy: Secondary | ICD-10-CM | POA: Diagnosis not present

## 2017-02-13 DIAGNOSIS — R3129 Other microscopic hematuria: Secondary | ICD-10-CM | POA: Diagnosis not present

## 2017-02-15 DIAGNOSIS — C679 Malignant neoplasm of bladder, unspecified: Secondary | ICD-10-CM | POA: Diagnosis not present

## 2017-02-15 DIAGNOSIS — N401 Enlarged prostate with lower urinary tract symptoms: Secondary | ICD-10-CM | POA: Diagnosis not present

## 2017-02-15 DIAGNOSIS — R3129 Other microscopic hematuria: Secondary | ICD-10-CM | POA: Diagnosis not present

## 2017-02-15 DIAGNOSIS — I10 Essential (primary) hypertension: Secondary | ICD-10-CM | POA: Diagnosis not present

## 2017-02-15 DIAGNOSIS — E119 Type 2 diabetes mellitus without complications: Secondary | ICD-10-CM | POA: Diagnosis not present

## 2017-02-15 DIAGNOSIS — N200 Calculus of kidney: Secondary | ICD-10-CM | POA: Diagnosis not present

## 2017-02-28 DIAGNOSIS — R351 Nocturia: Secondary | ICD-10-CM | POA: Diagnosis not present

## 2017-02-28 DIAGNOSIS — R3129 Other microscopic hematuria: Secondary | ICD-10-CM | POA: Diagnosis not present

## 2017-02-28 DIAGNOSIS — N200 Calculus of kidney: Secondary | ICD-10-CM | POA: Diagnosis not present

## 2017-03-23 DIAGNOSIS — Z299 Encounter for prophylactic measures, unspecified: Secondary | ICD-10-CM | POA: Diagnosis not present

## 2017-03-23 DIAGNOSIS — G4700 Insomnia, unspecified: Secondary | ICD-10-CM | POA: Diagnosis not present

## 2017-03-23 DIAGNOSIS — Z87891 Personal history of nicotine dependence: Secondary | ICD-10-CM | POA: Diagnosis not present

## 2017-03-23 DIAGNOSIS — Z6828 Body mass index (BMI) 28.0-28.9, adult: Secondary | ICD-10-CM | POA: Diagnosis not present

## 2017-03-23 DIAGNOSIS — I10 Essential (primary) hypertension: Secondary | ICD-10-CM | POA: Diagnosis not present

## 2017-03-23 DIAGNOSIS — C679 Malignant neoplasm of bladder, unspecified: Secondary | ICD-10-CM | POA: Diagnosis not present

## 2017-03-23 DIAGNOSIS — E1165 Type 2 diabetes mellitus with hyperglycemia: Secondary | ICD-10-CM | POA: Diagnosis not present

## 2017-05-03 DIAGNOSIS — M7061 Trochanteric bursitis, right hip: Secondary | ICD-10-CM | POA: Diagnosis not present

## 2017-05-04 DIAGNOSIS — E119 Type 2 diabetes mellitus without complications: Secondary | ICD-10-CM | POA: Diagnosis not present

## 2017-05-04 DIAGNOSIS — I10 Essential (primary) hypertension: Secondary | ICD-10-CM | POA: Diagnosis not present

## 2017-05-30 DIAGNOSIS — R5383 Other fatigue: Secondary | ICD-10-CM | POA: Diagnosis not present

## 2017-05-30 DIAGNOSIS — Z1339 Encounter for screening examination for other mental health and behavioral disorders: Secondary | ICD-10-CM | POA: Diagnosis not present

## 2017-05-30 DIAGNOSIS — Z0000 Encounter for general adult medical examination without abnormal findings: Secondary | ICD-10-CM | POA: Diagnosis not present

## 2017-05-30 DIAGNOSIS — G2581 Restless legs syndrome: Secondary | ICD-10-CM | POA: Diagnosis not present

## 2017-05-30 DIAGNOSIS — Z299 Encounter for prophylactic measures, unspecified: Secondary | ICD-10-CM | POA: Diagnosis not present

## 2017-05-30 DIAGNOSIS — C679 Malignant neoplasm of bladder, unspecified: Secondary | ICD-10-CM | POA: Diagnosis not present

## 2017-05-30 DIAGNOSIS — Z1211 Encounter for screening for malignant neoplasm of colon: Secondary | ICD-10-CM | POA: Diagnosis not present

## 2017-05-30 DIAGNOSIS — Z79899 Other long term (current) drug therapy: Secondary | ICD-10-CM | POA: Diagnosis not present

## 2017-05-30 DIAGNOSIS — N400 Enlarged prostate without lower urinary tract symptoms: Secondary | ICD-10-CM | POA: Diagnosis not present

## 2017-05-30 DIAGNOSIS — Z7189 Other specified counseling: Secondary | ICD-10-CM | POA: Diagnosis not present

## 2017-05-30 DIAGNOSIS — Z6826 Body mass index (BMI) 26.0-26.9, adult: Secondary | ICD-10-CM | POA: Diagnosis not present

## 2017-05-30 DIAGNOSIS — I10 Essential (primary) hypertension: Secondary | ICD-10-CM | POA: Diagnosis not present

## 2017-05-30 DIAGNOSIS — Z1331 Encounter for screening for depression: Secondary | ICD-10-CM | POA: Diagnosis not present

## 2017-05-31 DIAGNOSIS — E119 Type 2 diabetes mellitus without complications: Secondary | ICD-10-CM | POA: Diagnosis not present

## 2017-05-31 DIAGNOSIS — I10 Essential (primary) hypertension: Secondary | ICD-10-CM | POA: Diagnosis not present

## 2017-06-14 DIAGNOSIS — M7061 Trochanteric bursitis, right hip: Secondary | ICD-10-CM | POA: Diagnosis not present

## 2017-06-14 DIAGNOSIS — M25551 Pain in right hip: Secondary | ICD-10-CM | POA: Diagnosis not present

## 2017-06-27 DIAGNOSIS — R3129 Other microscopic hematuria: Secondary | ICD-10-CM | POA: Diagnosis not present

## 2017-06-27 DIAGNOSIS — N528 Other male erectile dysfunction: Secondary | ICD-10-CM | POA: Diagnosis not present

## 2017-06-27 DIAGNOSIS — N401 Enlarged prostate with lower urinary tract symptoms: Secondary | ICD-10-CM | POA: Diagnosis not present

## 2017-06-27 DIAGNOSIS — R351 Nocturia: Secondary | ICD-10-CM | POA: Diagnosis not present

## 2017-06-27 DIAGNOSIS — Z8551 Personal history of malignant neoplasm of bladder: Secondary | ICD-10-CM | POA: Diagnosis not present

## 2017-06-27 DIAGNOSIS — R3912 Poor urinary stream: Secondary | ICD-10-CM | POA: Diagnosis not present

## 2017-06-29 DIAGNOSIS — Z6826 Body mass index (BMI) 26.0-26.9, adult: Secondary | ICD-10-CM | POA: Diagnosis not present

## 2017-06-29 DIAGNOSIS — C679 Malignant neoplasm of bladder, unspecified: Secondary | ICD-10-CM | POA: Diagnosis not present

## 2017-06-29 DIAGNOSIS — Z299 Encounter for prophylactic measures, unspecified: Secondary | ICD-10-CM | POA: Diagnosis not present

## 2017-06-29 DIAGNOSIS — R252 Cramp and spasm: Secondary | ICD-10-CM | POA: Diagnosis not present

## 2017-06-29 DIAGNOSIS — E1165 Type 2 diabetes mellitus with hyperglycemia: Secondary | ICD-10-CM | POA: Diagnosis not present

## 2017-06-29 DIAGNOSIS — G4700 Insomnia, unspecified: Secondary | ICD-10-CM | POA: Diagnosis not present

## 2017-09-19 DIAGNOSIS — M25579 Pain in unspecified ankle and joints of unspecified foot: Secondary | ICD-10-CM | POA: Diagnosis not present

## 2017-09-19 DIAGNOSIS — M79672 Pain in left foot: Secondary | ICD-10-CM | POA: Diagnosis not present

## 2017-10-06 DIAGNOSIS — Z299 Encounter for prophylactic measures, unspecified: Secondary | ICD-10-CM | POA: Diagnosis not present

## 2017-10-06 DIAGNOSIS — C679 Malignant neoplasm of bladder, unspecified: Secondary | ICD-10-CM | POA: Diagnosis not present

## 2017-10-06 DIAGNOSIS — G4700 Insomnia, unspecified: Secondary | ICD-10-CM | POA: Diagnosis not present

## 2017-10-06 DIAGNOSIS — I10 Essential (primary) hypertension: Secondary | ICD-10-CM | POA: Diagnosis not present

## 2017-10-06 DIAGNOSIS — Z6826 Body mass index (BMI) 26.0-26.9, adult: Secondary | ICD-10-CM | POA: Diagnosis not present

## 2017-10-06 DIAGNOSIS — E1165 Type 2 diabetes mellitus with hyperglycemia: Secondary | ICD-10-CM | POA: Diagnosis not present

## 2017-10-09 DIAGNOSIS — I10 Essential (primary) hypertension: Secondary | ICD-10-CM | POA: Diagnosis not present

## 2017-10-09 DIAGNOSIS — E119 Type 2 diabetes mellitus without complications: Secondary | ICD-10-CM | POA: Diagnosis not present

## 2017-10-16 DIAGNOSIS — M25579 Pain in unspecified ankle and joints of unspecified foot: Secondary | ICD-10-CM | POA: Diagnosis not present

## 2017-10-16 DIAGNOSIS — M79672 Pain in left foot: Secondary | ICD-10-CM | POA: Diagnosis not present

## 2017-11-21 DIAGNOSIS — Z8551 Personal history of malignant neoplasm of bladder: Secondary | ICD-10-CM | POA: Diagnosis not present

## 2017-11-21 DIAGNOSIS — N401 Enlarged prostate with lower urinary tract symptoms: Secondary | ICD-10-CM | POA: Diagnosis not present

## 2017-11-21 DIAGNOSIS — R351 Nocturia: Secondary | ICD-10-CM | POA: Diagnosis not present

## 2017-11-21 DIAGNOSIS — N5089 Other specified disorders of the male genital organs: Secondary | ICD-10-CM | POA: Diagnosis not present

## 2017-12-11 DIAGNOSIS — E119 Type 2 diabetes mellitus without complications: Secondary | ICD-10-CM | POA: Diagnosis not present

## 2017-12-11 DIAGNOSIS — I10 Essential (primary) hypertension: Secondary | ICD-10-CM | POA: Diagnosis not present

## 2017-12-26 DIAGNOSIS — H2513 Age-related nuclear cataract, bilateral: Secondary | ICD-10-CM | POA: Diagnosis not present

## 2017-12-26 DIAGNOSIS — E119 Type 2 diabetes mellitus without complications: Secondary | ICD-10-CM | POA: Diagnosis not present

## 2017-12-26 DIAGNOSIS — H5203 Hypermetropia, bilateral: Secondary | ICD-10-CM | POA: Diagnosis not present

## 2017-12-26 DIAGNOSIS — H524 Presbyopia: Secondary | ICD-10-CM | POA: Diagnosis not present

## 2017-12-26 DIAGNOSIS — H43813 Vitreous degeneration, bilateral: Secondary | ICD-10-CM | POA: Diagnosis not present

## 2017-12-26 DIAGNOSIS — H52223 Regular astigmatism, bilateral: Secondary | ICD-10-CM | POA: Diagnosis not present

## 2017-12-26 DIAGNOSIS — H31113 Age-related choroidal atrophy, bilateral: Secondary | ICD-10-CM | POA: Diagnosis not present

## 2017-12-29 DIAGNOSIS — J069 Acute upper respiratory infection, unspecified: Secondary | ICD-10-CM | POA: Diagnosis not present

## 2017-12-29 DIAGNOSIS — E1165 Type 2 diabetes mellitus with hyperglycemia: Secondary | ICD-10-CM | POA: Diagnosis not present

## 2017-12-29 DIAGNOSIS — Z6827 Body mass index (BMI) 27.0-27.9, adult: Secondary | ICD-10-CM | POA: Diagnosis not present

## 2017-12-29 DIAGNOSIS — Z299 Encounter for prophylactic measures, unspecified: Secondary | ICD-10-CM | POA: Diagnosis not present

## 2017-12-29 DIAGNOSIS — I10 Essential (primary) hypertension: Secondary | ICD-10-CM | POA: Diagnosis not present

## 2018-01-02 DIAGNOSIS — R351 Nocturia: Secondary | ICD-10-CM | POA: Diagnosis not present

## 2018-01-02 DIAGNOSIS — R3914 Feeling of incomplete bladder emptying: Secondary | ICD-10-CM | POA: Diagnosis not present

## 2018-01-02 DIAGNOSIS — R3129 Other microscopic hematuria: Secondary | ICD-10-CM | POA: Diagnosis not present

## 2018-01-02 DIAGNOSIS — N401 Enlarged prostate with lower urinary tract symptoms: Secondary | ICD-10-CM | POA: Diagnosis not present

## 2018-01-02 DIAGNOSIS — N528 Other male erectile dysfunction: Secondary | ICD-10-CM | POA: Diagnosis not present

## 2018-01-12 DIAGNOSIS — Z2821 Immunization not carried out because of patient refusal: Secondary | ICD-10-CM | POA: Diagnosis not present

## 2018-01-12 DIAGNOSIS — Z6827 Body mass index (BMI) 27.0-27.9, adult: Secondary | ICD-10-CM | POA: Diagnosis not present

## 2018-01-12 DIAGNOSIS — R252 Cramp and spasm: Secondary | ICD-10-CM | POA: Diagnosis not present

## 2018-01-12 DIAGNOSIS — Z299 Encounter for prophylactic measures, unspecified: Secondary | ICD-10-CM | POA: Diagnosis not present

## 2018-01-12 DIAGNOSIS — I10 Essential (primary) hypertension: Secondary | ICD-10-CM | POA: Diagnosis not present

## 2018-01-12 DIAGNOSIS — E1165 Type 2 diabetes mellitus with hyperglycemia: Secondary | ICD-10-CM | POA: Diagnosis not present

## 2018-01-12 DIAGNOSIS — E7800 Pure hypercholesterolemia, unspecified: Secondary | ICD-10-CM | POA: Diagnosis not present

## 2018-01-25 DIAGNOSIS — E119 Type 2 diabetes mellitus without complications: Secondary | ICD-10-CM | POA: Diagnosis not present

## 2018-01-25 DIAGNOSIS — I10 Essential (primary) hypertension: Secondary | ICD-10-CM | POA: Diagnosis not present

## 2018-03-27 DIAGNOSIS — M546 Pain in thoracic spine: Secondary | ICD-10-CM | POA: Diagnosis not present

## 2018-03-27 DIAGNOSIS — M47812 Spondylosis without myelopathy or radiculopathy, cervical region: Secondary | ICD-10-CM | POA: Diagnosis not present

## 2018-03-27 DIAGNOSIS — M9903 Segmental and somatic dysfunction of lumbar region: Secondary | ICD-10-CM | POA: Diagnosis not present

## 2018-03-27 DIAGNOSIS — M9902 Segmental and somatic dysfunction of thoracic region: Secondary | ICD-10-CM | POA: Diagnosis not present

## 2018-03-27 DIAGNOSIS — M9901 Segmental and somatic dysfunction of cervical region: Secondary | ICD-10-CM | POA: Diagnosis not present

## 2018-03-27 DIAGNOSIS — S338XXA Sprain of other parts of lumbar spine and pelvis, initial encounter: Secondary | ICD-10-CM | POA: Diagnosis not present

## 2018-03-30 DIAGNOSIS — M9901 Segmental and somatic dysfunction of cervical region: Secondary | ICD-10-CM | POA: Diagnosis not present

## 2018-03-30 DIAGNOSIS — M9903 Segmental and somatic dysfunction of lumbar region: Secondary | ICD-10-CM | POA: Diagnosis not present

## 2018-03-30 DIAGNOSIS — M47812 Spondylosis without myelopathy or radiculopathy, cervical region: Secondary | ICD-10-CM | POA: Diagnosis not present

## 2018-03-30 DIAGNOSIS — S338XXA Sprain of other parts of lumbar spine and pelvis, initial encounter: Secondary | ICD-10-CM | POA: Diagnosis not present

## 2018-03-30 DIAGNOSIS — M546 Pain in thoracic spine: Secondary | ICD-10-CM | POA: Diagnosis not present

## 2018-03-30 DIAGNOSIS — M9902 Segmental and somatic dysfunction of thoracic region: Secondary | ICD-10-CM | POA: Diagnosis not present

## 2018-04-06 DIAGNOSIS — M9903 Segmental and somatic dysfunction of lumbar region: Secondary | ICD-10-CM | POA: Diagnosis not present

## 2018-04-06 DIAGNOSIS — M47812 Spondylosis without myelopathy or radiculopathy, cervical region: Secondary | ICD-10-CM | POA: Diagnosis not present

## 2018-04-06 DIAGNOSIS — M9901 Segmental and somatic dysfunction of cervical region: Secondary | ICD-10-CM | POA: Diagnosis not present

## 2018-04-06 DIAGNOSIS — M9902 Segmental and somatic dysfunction of thoracic region: Secondary | ICD-10-CM | POA: Diagnosis not present

## 2018-04-06 DIAGNOSIS — S338XXA Sprain of other parts of lumbar spine and pelvis, initial encounter: Secondary | ICD-10-CM | POA: Diagnosis not present

## 2018-04-06 DIAGNOSIS — M546 Pain in thoracic spine: Secondary | ICD-10-CM | POA: Diagnosis not present

## 2018-04-17 DIAGNOSIS — M9902 Segmental and somatic dysfunction of thoracic region: Secondary | ICD-10-CM | POA: Diagnosis not present

## 2018-04-17 DIAGNOSIS — M9903 Segmental and somatic dysfunction of lumbar region: Secondary | ICD-10-CM | POA: Diagnosis not present

## 2018-04-17 DIAGNOSIS — S338XXA Sprain of other parts of lumbar spine and pelvis, initial encounter: Secondary | ICD-10-CM | POA: Diagnosis not present

## 2018-04-17 DIAGNOSIS — M9901 Segmental and somatic dysfunction of cervical region: Secondary | ICD-10-CM | POA: Diagnosis not present

## 2018-04-17 DIAGNOSIS — M47812 Spondylosis without myelopathy or radiculopathy, cervical region: Secondary | ICD-10-CM | POA: Diagnosis not present

## 2018-04-17 DIAGNOSIS — M546 Pain in thoracic spine: Secondary | ICD-10-CM | POA: Diagnosis not present

## 2018-04-20 DIAGNOSIS — E1165 Type 2 diabetes mellitus with hyperglycemia: Secondary | ICD-10-CM | POA: Diagnosis not present

## 2018-04-20 DIAGNOSIS — I10 Essential (primary) hypertension: Secondary | ICD-10-CM | POA: Diagnosis not present

## 2018-04-20 DIAGNOSIS — Z789 Other specified health status: Secondary | ICD-10-CM | POA: Diagnosis not present

## 2018-04-20 DIAGNOSIS — Z299 Encounter for prophylactic measures, unspecified: Secondary | ICD-10-CM | POA: Diagnosis not present

## 2018-04-20 DIAGNOSIS — Z6827 Body mass index (BMI) 27.0-27.9, adult: Secondary | ICD-10-CM | POA: Diagnosis not present

## 2018-04-20 DIAGNOSIS — G4700 Insomnia, unspecified: Secondary | ICD-10-CM | POA: Diagnosis not present

## 2018-05-02 DIAGNOSIS — M546 Pain in thoracic spine: Secondary | ICD-10-CM | POA: Diagnosis not present

## 2018-05-02 DIAGNOSIS — M9901 Segmental and somatic dysfunction of cervical region: Secondary | ICD-10-CM | POA: Diagnosis not present

## 2018-05-02 DIAGNOSIS — M9903 Segmental and somatic dysfunction of lumbar region: Secondary | ICD-10-CM | POA: Diagnosis not present

## 2018-05-02 DIAGNOSIS — S338XXA Sprain of other parts of lumbar spine and pelvis, initial encounter: Secondary | ICD-10-CM | POA: Diagnosis not present

## 2018-05-02 DIAGNOSIS — M9902 Segmental and somatic dysfunction of thoracic region: Secondary | ICD-10-CM | POA: Diagnosis not present

## 2018-05-02 DIAGNOSIS — Z872 Personal history of diseases of the skin and subcutaneous tissue: Secondary | ICD-10-CM | POA: Diagnosis not present

## 2018-05-02 DIAGNOSIS — M47812 Spondylosis without myelopathy or radiculopathy, cervical region: Secondary | ICD-10-CM | POA: Diagnosis not present

## 2018-05-02 DIAGNOSIS — D1801 Hemangioma of skin and subcutaneous tissue: Secondary | ICD-10-CM | POA: Diagnosis not present

## 2018-05-02 DIAGNOSIS — L281 Prurigo nodularis: Secondary | ICD-10-CM | POA: Diagnosis not present

## 2018-05-02 DIAGNOSIS — Z85828 Personal history of other malignant neoplasm of skin: Secondary | ICD-10-CM | POA: Diagnosis not present

## 2018-05-02 DIAGNOSIS — L820 Inflamed seborrheic keratosis: Secondary | ICD-10-CM | POA: Diagnosis not present

## 2018-05-02 DIAGNOSIS — D225 Melanocytic nevi of trunk: Secondary | ICD-10-CM | POA: Diagnosis not present

## 2018-05-16 DIAGNOSIS — S338XXA Sprain of other parts of lumbar spine and pelvis, initial encounter: Secondary | ICD-10-CM | POA: Diagnosis not present

## 2018-05-16 DIAGNOSIS — M9902 Segmental and somatic dysfunction of thoracic region: Secondary | ICD-10-CM | POA: Diagnosis not present

## 2018-05-16 DIAGNOSIS — M9901 Segmental and somatic dysfunction of cervical region: Secondary | ICD-10-CM | POA: Diagnosis not present

## 2018-05-16 DIAGNOSIS — M546 Pain in thoracic spine: Secondary | ICD-10-CM | POA: Diagnosis not present

## 2018-05-16 DIAGNOSIS — M9903 Segmental and somatic dysfunction of lumbar region: Secondary | ICD-10-CM | POA: Diagnosis not present

## 2018-05-16 DIAGNOSIS — M47812 Spondylosis without myelopathy or radiculopathy, cervical region: Secondary | ICD-10-CM | POA: Diagnosis not present

## 2018-07-03 DIAGNOSIS — Z8551 Personal history of malignant neoplasm of bladder: Secondary | ICD-10-CM | POA: Diagnosis not present

## 2018-07-03 DIAGNOSIS — Z87442 Personal history of urinary calculi: Secondary | ICD-10-CM | POA: Diagnosis not present

## 2018-07-03 DIAGNOSIS — N528 Other male erectile dysfunction: Secondary | ICD-10-CM | POA: Diagnosis not present

## 2018-07-03 DIAGNOSIS — N401 Enlarged prostate with lower urinary tract symptoms: Secondary | ICD-10-CM | POA: Diagnosis not present

## 2018-07-03 DIAGNOSIS — R3912 Poor urinary stream: Secondary | ICD-10-CM | POA: Diagnosis not present

## 2018-07-03 DIAGNOSIS — R3129 Other microscopic hematuria: Secondary | ICD-10-CM | POA: Diagnosis not present

## 2018-07-24 DIAGNOSIS — C679 Malignant neoplasm of bladder, unspecified: Secondary | ICD-10-CM | POA: Diagnosis not present

## 2018-07-24 DIAGNOSIS — Z299 Encounter for prophylactic measures, unspecified: Secondary | ICD-10-CM | POA: Diagnosis not present

## 2018-07-24 DIAGNOSIS — Z6826 Body mass index (BMI) 26.0-26.9, adult: Secondary | ICD-10-CM | POA: Diagnosis not present

## 2018-07-24 DIAGNOSIS — Z1211 Encounter for screening for malignant neoplasm of colon: Secondary | ICD-10-CM | POA: Diagnosis not present

## 2018-07-24 DIAGNOSIS — E1165 Type 2 diabetes mellitus with hyperglycemia: Secondary | ICD-10-CM | POA: Diagnosis not present

## 2018-07-24 DIAGNOSIS — Z7189 Other specified counseling: Secondary | ICD-10-CM | POA: Diagnosis not present

## 2018-07-24 DIAGNOSIS — Z79899 Other long term (current) drug therapy: Secondary | ICD-10-CM | POA: Diagnosis not present

## 2018-07-24 DIAGNOSIS — Z1331 Encounter for screening for depression: Secondary | ICD-10-CM | POA: Diagnosis not present

## 2018-07-24 DIAGNOSIS — Z0000 Encounter for general adult medical examination without abnormal findings: Secondary | ICD-10-CM | POA: Diagnosis not present

## 2018-07-24 DIAGNOSIS — E7800 Pure hypercholesterolemia, unspecified: Secondary | ICD-10-CM | POA: Diagnosis not present

## 2018-07-24 DIAGNOSIS — Z1339 Encounter for screening examination for other mental health and behavioral disorders: Secondary | ICD-10-CM | POA: Diagnosis not present

## 2018-07-24 DIAGNOSIS — I10 Essential (primary) hypertension: Secondary | ICD-10-CM | POA: Diagnosis not present

## 2018-08-09 DIAGNOSIS — M79672 Pain in left foot: Secondary | ICD-10-CM | POA: Diagnosis not present

## 2018-08-09 DIAGNOSIS — M25579 Pain in unspecified ankle and joints of unspecified foot: Secondary | ICD-10-CM | POA: Diagnosis not present

## 2018-08-09 DIAGNOSIS — E1140 Type 2 diabetes mellitus with diabetic neuropathy, unspecified: Secondary | ICD-10-CM | POA: Diagnosis not present

## 2018-08-09 DIAGNOSIS — M79671 Pain in right foot: Secondary | ICD-10-CM | POA: Diagnosis not present

## 2018-08-13 DIAGNOSIS — M79672 Pain in left foot: Secondary | ICD-10-CM | POA: Diagnosis not present

## 2018-08-13 DIAGNOSIS — M25579 Pain in unspecified ankle and joints of unspecified foot: Secondary | ICD-10-CM | POA: Diagnosis not present

## 2018-09-03 DIAGNOSIS — M25579 Pain in unspecified ankle and joints of unspecified foot: Secondary | ICD-10-CM | POA: Diagnosis not present

## 2018-09-03 DIAGNOSIS — M79672 Pain in left foot: Secondary | ICD-10-CM | POA: Diagnosis not present

## 2018-10-08 DIAGNOSIS — M79672 Pain in left foot: Secondary | ICD-10-CM | POA: Diagnosis not present

## 2018-10-08 DIAGNOSIS — M79671 Pain in right foot: Secondary | ICD-10-CM | POA: Diagnosis not present

## 2018-10-08 DIAGNOSIS — M25579 Pain in unspecified ankle and joints of unspecified foot: Secondary | ICD-10-CM | POA: Diagnosis not present

## 2018-11-02 DIAGNOSIS — E7800 Pure hypercholesterolemia, unspecified: Secondary | ICD-10-CM | POA: Diagnosis not present

## 2018-11-02 DIAGNOSIS — Z6826 Body mass index (BMI) 26.0-26.9, adult: Secondary | ICD-10-CM | POA: Diagnosis not present

## 2018-11-02 DIAGNOSIS — Z299 Encounter for prophylactic measures, unspecified: Secondary | ICD-10-CM | POA: Diagnosis not present

## 2018-11-02 DIAGNOSIS — I10 Essential (primary) hypertension: Secondary | ICD-10-CM | POA: Diagnosis not present

## 2018-11-02 DIAGNOSIS — C679 Malignant neoplasm of bladder, unspecified: Secondary | ICD-10-CM | POA: Diagnosis not present

## 2018-11-02 DIAGNOSIS — E1165 Type 2 diabetes mellitus with hyperglycemia: Secondary | ICD-10-CM | POA: Diagnosis not present

## 2018-11-23 DIAGNOSIS — M47812 Spondylosis without myelopathy or radiculopathy, cervical region: Secondary | ICD-10-CM | POA: Diagnosis not present

## 2018-11-23 DIAGNOSIS — M9902 Segmental and somatic dysfunction of thoracic region: Secondary | ICD-10-CM | POA: Diagnosis not present

## 2018-11-23 DIAGNOSIS — M9903 Segmental and somatic dysfunction of lumbar region: Secondary | ICD-10-CM | POA: Diagnosis not present

## 2018-11-23 DIAGNOSIS — S338XXA Sprain of other parts of lumbar spine and pelvis, initial encounter: Secondary | ICD-10-CM | POA: Diagnosis not present

## 2018-11-23 DIAGNOSIS — M9901 Segmental and somatic dysfunction of cervical region: Secondary | ICD-10-CM | POA: Diagnosis not present

## 2018-11-23 DIAGNOSIS — M546 Pain in thoracic spine: Secondary | ICD-10-CM | POA: Diagnosis not present

## 2018-11-29 DIAGNOSIS — M546 Pain in thoracic spine: Secondary | ICD-10-CM | POA: Diagnosis not present

## 2018-11-29 DIAGNOSIS — M9902 Segmental and somatic dysfunction of thoracic region: Secondary | ICD-10-CM | POA: Diagnosis not present

## 2018-11-29 DIAGNOSIS — M9903 Segmental and somatic dysfunction of lumbar region: Secondary | ICD-10-CM | POA: Diagnosis not present

## 2018-11-29 DIAGNOSIS — M9901 Segmental and somatic dysfunction of cervical region: Secondary | ICD-10-CM | POA: Diagnosis not present

## 2018-11-29 DIAGNOSIS — S338XXA Sprain of other parts of lumbar spine and pelvis, initial encounter: Secondary | ICD-10-CM | POA: Diagnosis not present

## 2018-11-29 DIAGNOSIS — M47812 Spondylosis without myelopathy or radiculopathy, cervical region: Secondary | ICD-10-CM | POA: Diagnosis not present

## 2018-11-30 DIAGNOSIS — E1142 Type 2 diabetes mellitus with diabetic polyneuropathy: Secondary | ICD-10-CM | POA: Diagnosis not present

## 2018-11-30 DIAGNOSIS — M79672 Pain in left foot: Secondary | ICD-10-CM | POA: Diagnosis not present

## 2018-11-30 DIAGNOSIS — M79671 Pain in right foot: Secondary | ICD-10-CM | POA: Diagnosis not present

## 2018-12-03 ENCOUNTER — Other Ambulatory Visit (HOSPITAL_COMMUNITY): Payer: Self-pay | Admitting: Podiatry

## 2018-12-03 ENCOUNTER — Other Ambulatory Visit: Payer: Self-pay | Admitting: Podiatry

## 2018-12-03 DIAGNOSIS — I739 Peripheral vascular disease, unspecified: Secondary | ICD-10-CM

## 2018-12-06 ENCOUNTER — Other Ambulatory Visit: Payer: Self-pay

## 2018-12-06 ENCOUNTER — Other Ambulatory Visit (HOSPITAL_COMMUNITY): Payer: Self-pay | Admitting: Podiatry

## 2018-12-06 ENCOUNTER — Ambulatory Visit (HOSPITAL_COMMUNITY)
Admission: RE | Admit: 2018-12-06 | Discharge: 2018-12-06 | Disposition: A | Discharge status: Home / Self Care | Payer: Medicare Other | Source: Ambulatory Visit | Attending: Podiatry | Admitting: Podiatry

## 2018-12-06 DIAGNOSIS — I739 Peripheral vascular disease, unspecified: Secondary | ICD-10-CM | POA: Diagnosis not present

## 2018-12-07 DIAGNOSIS — M546 Pain in thoracic spine: Secondary | ICD-10-CM | POA: Diagnosis not present

## 2018-12-07 DIAGNOSIS — S338XXA Sprain of other parts of lumbar spine and pelvis, initial encounter: Secondary | ICD-10-CM | POA: Diagnosis not present

## 2018-12-07 DIAGNOSIS — M9901 Segmental and somatic dysfunction of cervical region: Secondary | ICD-10-CM | POA: Diagnosis not present

## 2018-12-07 DIAGNOSIS — M9902 Segmental and somatic dysfunction of thoracic region: Secondary | ICD-10-CM | POA: Diagnosis not present

## 2018-12-07 DIAGNOSIS — M47812 Spondylosis without myelopathy or radiculopathy, cervical region: Secondary | ICD-10-CM | POA: Diagnosis not present

## 2018-12-07 DIAGNOSIS — M9903 Segmental and somatic dysfunction of lumbar region: Secondary | ICD-10-CM | POA: Diagnosis not present

## 2018-12-14 DIAGNOSIS — M9903 Segmental and somatic dysfunction of lumbar region: Secondary | ICD-10-CM | POA: Diagnosis not present

## 2018-12-14 DIAGNOSIS — S338XXA Sprain of other parts of lumbar spine and pelvis, initial encounter: Secondary | ICD-10-CM | POA: Diagnosis not present

## 2018-12-14 DIAGNOSIS — M47812 Spondylosis without myelopathy or radiculopathy, cervical region: Secondary | ICD-10-CM | POA: Diagnosis not present

## 2018-12-14 DIAGNOSIS — M9902 Segmental and somatic dysfunction of thoracic region: Secondary | ICD-10-CM | POA: Diagnosis not present

## 2018-12-14 DIAGNOSIS — M9901 Segmental and somatic dysfunction of cervical region: Secondary | ICD-10-CM | POA: Diagnosis not present

## 2018-12-14 DIAGNOSIS — M546 Pain in thoracic spine: Secondary | ICD-10-CM | POA: Diagnosis not present

## 2018-12-21 DIAGNOSIS — M9902 Segmental and somatic dysfunction of thoracic region: Secondary | ICD-10-CM | POA: Diagnosis not present

## 2018-12-21 DIAGNOSIS — M546 Pain in thoracic spine: Secondary | ICD-10-CM | POA: Diagnosis not present

## 2018-12-21 DIAGNOSIS — M47812 Spondylosis without myelopathy or radiculopathy, cervical region: Secondary | ICD-10-CM | POA: Diagnosis not present

## 2018-12-21 DIAGNOSIS — S338XXA Sprain of other parts of lumbar spine and pelvis, initial encounter: Secondary | ICD-10-CM | POA: Diagnosis not present

## 2018-12-21 DIAGNOSIS — M9903 Segmental and somatic dysfunction of lumbar region: Secondary | ICD-10-CM | POA: Diagnosis not present

## 2018-12-21 DIAGNOSIS — M9901 Segmental and somatic dysfunction of cervical region: Secondary | ICD-10-CM | POA: Diagnosis not present

## 2018-12-27 DIAGNOSIS — M9903 Segmental and somatic dysfunction of lumbar region: Secondary | ICD-10-CM | POA: Diagnosis not present

## 2018-12-27 DIAGNOSIS — M47812 Spondylosis without myelopathy or radiculopathy, cervical region: Secondary | ICD-10-CM | POA: Diagnosis not present

## 2018-12-27 DIAGNOSIS — M9901 Segmental and somatic dysfunction of cervical region: Secondary | ICD-10-CM | POA: Diagnosis not present

## 2018-12-27 DIAGNOSIS — M546 Pain in thoracic spine: Secondary | ICD-10-CM | POA: Diagnosis not present

## 2018-12-27 DIAGNOSIS — M9902 Segmental and somatic dysfunction of thoracic region: Secondary | ICD-10-CM | POA: Diagnosis not present

## 2018-12-27 DIAGNOSIS — S338XXA Sprain of other parts of lumbar spine and pelvis, initial encounter: Secondary | ICD-10-CM | POA: Diagnosis not present

## 2018-12-28 DIAGNOSIS — E1142 Type 2 diabetes mellitus with diabetic polyneuropathy: Secondary | ICD-10-CM | POA: Diagnosis not present

## 2019-01-03 ENCOUNTER — Encounter: Payer: Medicare Other | Admitting: Diagnostic Neuroimaging

## 2019-01-03 DIAGNOSIS — M9901 Segmental and somatic dysfunction of cervical region: Secondary | ICD-10-CM | POA: Diagnosis not present

## 2019-01-03 DIAGNOSIS — M546 Pain in thoracic spine: Secondary | ICD-10-CM | POA: Diagnosis not present

## 2019-01-03 DIAGNOSIS — M9902 Segmental and somatic dysfunction of thoracic region: Secondary | ICD-10-CM | POA: Diagnosis not present

## 2019-01-03 DIAGNOSIS — M9903 Segmental and somatic dysfunction of lumbar region: Secondary | ICD-10-CM | POA: Diagnosis not present

## 2019-01-03 DIAGNOSIS — M47812 Spondylosis without myelopathy or radiculopathy, cervical region: Secondary | ICD-10-CM | POA: Diagnosis not present

## 2019-01-03 DIAGNOSIS — S338XXA Sprain of other parts of lumbar spine and pelvis, initial encounter: Secondary | ICD-10-CM | POA: Diagnosis not present

## 2019-01-17 DIAGNOSIS — S338XXA Sprain of other parts of lumbar spine and pelvis, initial encounter: Secondary | ICD-10-CM | POA: Diagnosis not present

## 2019-01-17 DIAGNOSIS — M9903 Segmental and somatic dysfunction of lumbar region: Secondary | ICD-10-CM | POA: Diagnosis not present

## 2019-01-17 DIAGNOSIS — M9901 Segmental and somatic dysfunction of cervical region: Secondary | ICD-10-CM | POA: Diagnosis not present

## 2019-01-17 DIAGNOSIS — M9902 Segmental and somatic dysfunction of thoracic region: Secondary | ICD-10-CM | POA: Diagnosis not present

## 2019-01-17 DIAGNOSIS — M47812 Spondylosis without myelopathy or radiculopathy, cervical region: Secondary | ICD-10-CM | POA: Diagnosis not present

## 2019-01-17 DIAGNOSIS — M546 Pain in thoracic spine: Secondary | ICD-10-CM | POA: Diagnosis not present

## 2019-01-31 ENCOUNTER — Encounter (INDEPENDENT_AMBULATORY_CARE_PROVIDER_SITE_OTHER): Payer: Medicare Other | Admitting: Diagnostic Neuroimaging

## 2019-01-31 ENCOUNTER — Other Ambulatory Visit: Payer: Self-pay

## 2019-01-31 ENCOUNTER — Ambulatory Visit (INDEPENDENT_AMBULATORY_CARE_PROVIDER_SITE_OTHER): Payer: Medicare Other | Admitting: Diagnostic Neuroimaging

## 2019-01-31 DIAGNOSIS — R208 Other disturbances of skin sensation: Secondary | ICD-10-CM | POA: Diagnosis not present

## 2019-01-31 DIAGNOSIS — M9903 Segmental and somatic dysfunction of lumbar region: Secondary | ICD-10-CM | POA: Diagnosis not present

## 2019-01-31 DIAGNOSIS — Z0289 Encounter for other administrative examinations: Secondary | ICD-10-CM

## 2019-01-31 DIAGNOSIS — M546 Pain in thoracic spine: Secondary | ICD-10-CM | POA: Diagnosis not present

## 2019-01-31 DIAGNOSIS — M9901 Segmental and somatic dysfunction of cervical region: Secondary | ICD-10-CM | POA: Diagnosis not present

## 2019-01-31 DIAGNOSIS — S338XXA Sprain of other parts of lumbar spine and pelvis, initial encounter: Secondary | ICD-10-CM | POA: Diagnosis not present

## 2019-01-31 DIAGNOSIS — M9902 Segmental and somatic dysfunction of thoracic region: Secondary | ICD-10-CM | POA: Diagnosis not present

## 2019-01-31 DIAGNOSIS — M47812 Spondylosis without myelopathy or radiculopathy, cervical region: Secondary | ICD-10-CM | POA: Diagnosis not present

## 2019-02-05 DIAGNOSIS — I10 Essential (primary) hypertension: Secondary | ICD-10-CM | POA: Diagnosis not present

## 2019-02-05 DIAGNOSIS — Z299 Encounter for prophylactic measures, unspecified: Secondary | ICD-10-CM | POA: Diagnosis not present

## 2019-02-05 DIAGNOSIS — G4700 Insomnia, unspecified: Secondary | ICD-10-CM | POA: Diagnosis not present

## 2019-02-05 DIAGNOSIS — Z2821 Immunization not carried out because of patient refusal: Secondary | ICD-10-CM | POA: Diagnosis not present

## 2019-02-05 DIAGNOSIS — Z6826 Body mass index (BMI) 26.0-26.9, adult: Secondary | ICD-10-CM | POA: Diagnosis not present

## 2019-02-05 DIAGNOSIS — E1165 Type 2 diabetes mellitus with hyperglycemia: Secondary | ICD-10-CM | POA: Diagnosis not present

## 2019-02-05 NOTE — Procedures (Signed)
[facility]  NCS (NERVE CONDUCTION STUDY) WITH EMG (ELECTROMYOGRAPHY) REPORT   STUDY DATE: 01/31/19 PATIENT NAME: Eric [name] DOB: 06/27/1945 MRN: [id number]  ORDERING CLINICIAN: B Marcheta Grammes  TECHNOLOGIST: Sherre Scarlet ELECTROMYOGRAPHER: Earlean Polka. Kelci Petrella, MD  CLINICAL INFORMATION: 73 year old male with burning feet. Diabetes since 2004.   FINDINGS: NERVE CONDUCTION STUDY:  Left ulnar motor response is normal.  Bilateral tibial and right peroneal motor responses have normal distal latencies, decreased amplitudes, slow conduction velocities.  Left peroneal motor response cannot be obtained.  Left radial and left median sensory responses have prolonged peak latenciers and decreased amplitudes.  Bilateral sural and superficial peroneal sensory responses could not be obtained.  Bilateral tibial F wave latencies are prolonged.  Left ulnar F-wave latency is prolonged.   NEEDLE ELECTROMYOGRAPHY:  Needle examination of left lower extremity is normal.   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     INTERPRETING PHYSICIAN:  Penni Bombard, MD Certified in Neurology, Neurophysiology and Neuroimaging  [facility] [address] [place], [place] [address] [phone]   [facility]    Nerve / Sites Muscle Latency Ref. Amplitude Ref. Rel Amp Segments Distance Velocity Ref. Area    ms ms mV mV %  cm m/s m/s mVms  L Ulnar - ADM     Wrist ADM 3.3 ?3.3 7.9 ?6.0 100 Wrist - ADM 7   27.9     B.Elbow ADM 7.9  7.3  92.5 B.Elbow - Wrist 23 50 ?49 27.9     A.Elbow ADM 9.9  6.7  91.4 A.Elbow - B.Elbow 10 49 ?49 34.0         A.Elbow - Wrist      L Peroneal - EDB     Ankle EDB NR ?6.5 NR ?2.0 NR Ankle - EDB 9    NR     Fib head EDB NR  NR  NR Fib head - Ankle 33 NR ?44 NR     Pop fossa EDB NR  NR  NR Pop fossa - Fib head 10 NR ?44 NR         Pop fossa - Ankle      R Peroneal - EDB     Ankle EDB 6.5 ?6.5 0.1 ?2.0 100 Ankle - EDB 9   1.2     Fib head EDB 17.9  0.2  208 Fib head - Ankle 33 29 ?44 0.6     Pop fossa EDB 21.7  0.3  111 Pop fossa - Fib head 10 26 ?44 0.8         Pop fossa - Ankle      L Tibial - AH     Ankle AH 5.4 ?5.8 1.3 ?4.0 100 Ankle - AH 9   4.2     Pop fossa AH 18.8  0.7  53.3 Pop fossa - Ankle 39 29 ?41 2.0  R Tibial - AH     Ankle AH 5.2 ?5.8 3.0 ?4.0 100 Ankle - AH 9   8.3     Pop fossa AH 17.0  1.0  33.8 Pop fossa - Ankle 39 33 ?41 1.4               SNC    Nerve / Sites Rec. Site Peak Lat Ref.  Amp Ref. Segments  Distance    ms ms V V  cm  L Radial - Anatomical snuff box (Forearm)     Forearm Wrist 3.1 ?2.9 6 ?15 Forearm - Wrist 10  L Sural - Ankle (Calf)     Calf Ankle NR ?4.4 NR ?6 Calf - Ankle 14  R Sural - Ankle (Calf)     Calf Ankle NR ?4.4 NR ?6 Calf - Ankle 14  L Superficial peroneal - Ankle     Lat leg Ankle NR ?4.4 NR ?6 Lat leg - Ankle 14  R Superficial peroneal - Ankle     Lat leg Ankle NR ?4.4 NR ?6 Lat leg - Ankle 14  L Ulnar - Orthodromic (Dig II, Mid palm)     Dig II Wrist 3.8 ?3.4 2 ?10 Dig II - Wrist 69                  F  Wave    Nerve F Lat Ref.   ms ms  L Tibial - AH 70.8 ?56.0  R Tibial - AH 70.8 ?56.0  L Ulnar - ADM 36.4 ?32.0           EMG full       EMG Summary Table    Spontaneous MUAP Recruitment  Muscle IA Fib PSW Fasc Other Amp Dur. Poly Pattern  L. Vastus medialis Normal None None None _______ Normal Normal Normal Normal  L. Gastrocnemius (Medial head) Normal None None None _______ Normal Normal Normal Normal  L. Tibialis anterior Normal None None None _______ Normal Normal Normal Normal

## 2019-02-12 DIAGNOSIS — H5203 Hypermetropia, bilateral: Secondary | ICD-10-CM | POA: Diagnosis not present

## 2019-02-12 DIAGNOSIS — H43813 Vitreous degeneration, bilateral: Secondary | ICD-10-CM | POA: Diagnosis not present

## 2019-02-12 DIAGNOSIS — H2513 Age-related nuclear cataract, bilateral: Secondary | ICD-10-CM | POA: Diagnosis not present

## 2019-02-12 DIAGNOSIS — H52223 Regular astigmatism, bilateral: Secondary | ICD-10-CM | POA: Diagnosis not present

## 2019-02-12 DIAGNOSIS — E119 Type 2 diabetes mellitus without complications: Secondary | ICD-10-CM | POA: Diagnosis not present

## 2019-02-12 DIAGNOSIS — H524 Presbyopia: Secondary | ICD-10-CM | POA: Diagnosis not present

## 2019-02-12 DIAGNOSIS — H31113 Age-related choroidal atrophy, bilateral: Secondary | ICD-10-CM | POA: Diagnosis not present

## 2019-02-14 DIAGNOSIS — M9903 Segmental and somatic dysfunction of lumbar region: Secondary | ICD-10-CM | POA: Diagnosis not present

## 2019-02-14 DIAGNOSIS — M47812 Spondylosis without myelopathy or radiculopathy, cervical region: Secondary | ICD-10-CM | POA: Diagnosis not present

## 2019-02-14 DIAGNOSIS — S338XXA Sprain of other parts of lumbar spine and pelvis, initial encounter: Secondary | ICD-10-CM | POA: Diagnosis not present

## 2019-02-14 DIAGNOSIS — M9902 Segmental and somatic dysfunction of thoracic region: Secondary | ICD-10-CM | POA: Diagnosis not present

## 2019-02-14 DIAGNOSIS — M546 Pain in thoracic spine: Secondary | ICD-10-CM | POA: Diagnosis not present

## 2019-02-14 DIAGNOSIS — M9901 Segmental and somatic dysfunction of cervical region: Secondary | ICD-10-CM | POA: Diagnosis not present

## 2019-02-15 DIAGNOSIS — E1142 Type 2 diabetes mellitus with diabetic polyneuropathy: Secondary | ICD-10-CM | POA: Diagnosis not present

## 2019-02-15 DIAGNOSIS — M25572 Pain in left ankle and joints of left foot: Secondary | ICD-10-CM | POA: Diagnosis not present

## 2019-02-15 DIAGNOSIS — M79672 Pain in left foot: Secondary | ICD-10-CM | POA: Diagnosis not present

## 2019-04-12 DIAGNOSIS — M25572 Pain in left ankle and joints of left foot: Secondary | ICD-10-CM | POA: Diagnosis not present

## 2019-04-12 DIAGNOSIS — M79672 Pain in left foot: Secondary | ICD-10-CM | POA: Diagnosis not present

## 2019-04-12 DIAGNOSIS — E1142 Type 2 diabetes mellitus with diabetic polyneuropathy: Secondary | ICD-10-CM | POA: Diagnosis not present

## 2019-05-13 DIAGNOSIS — E1165 Type 2 diabetes mellitus with hyperglycemia: Secondary | ICD-10-CM | POA: Diagnosis not present

## 2019-05-13 DIAGNOSIS — Z872 Personal history of diseases of the skin and subcutaneous tissue: Secondary | ICD-10-CM | POA: Diagnosis not present

## 2019-05-13 DIAGNOSIS — L821 Other seborrheic keratosis: Secondary | ICD-10-CM | POA: Diagnosis not present

## 2019-05-13 DIAGNOSIS — L820 Inflamed seborrheic keratosis: Secondary | ICD-10-CM | POA: Diagnosis not present

## 2019-05-13 DIAGNOSIS — Z6827 Body mass index (BMI) 27.0-27.9, adult: Secondary | ICD-10-CM | POA: Diagnosis not present

## 2019-05-13 DIAGNOSIS — Z789 Other specified health status: Secondary | ICD-10-CM | POA: Diagnosis not present

## 2019-05-13 DIAGNOSIS — D229 Melanocytic nevi, unspecified: Secondary | ICD-10-CM | POA: Diagnosis not present

## 2019-05-13 DIAGNOSIS — D1801 Hemangioma of skin and subcutaneous tissue: Secondary | ICD-10-CM | POA: Diagnosis not present

## 2019-05-13 DIAGNOSIS — Z85828 Personal history of other malignant neoplasm of skin: Secondary | ICD-10-CM | POA: Diagnosis not present

## 2019-05-13 DIAGNOSIS — Z299 Encounter for prophylactic measures, unspecified: Secondary | ICD-10-CM | POA: Diagnosis not present

## 2019-05-13 DIAGNOSIS — L905 Scar conditions and fibrosis of skin: Secondary | ICD-10-CM | POA: Diagnosis not present

## 2019-05-13 DIAGNOSIS — L812 Freckles: Secondary | ICD-10-CM | POA: Diagnosis not present

## 2019-05-13 DIAGNOSIS — G4700 Insomnia, unspecified: Secondary | ICD-10-CM | POA: Diagnosis not present

## 2019-05-13 DIAGNOSIS — I10 Essential (primary) hypertension: Secondary | ICD-10-CM | POA: Diagnosis not present

## 2019-05-13 DIAGNOSIS — C679 Malignant neoplasm of bladder, unspecified: Secondary | ICD-10-CM | POA: Diagnosis not present

## 2019-06-26 DIAGNOSIS — M7542 Impingement syndrome of left shoulder: Secondary | ICD-10-CM | POA: Diagnosis not present

## 2019-06-26 DIAGNOSIS — M7541 Impingement syndrome of right shoulder: Secondary | ICD-10-CM | POA: Diagnosis not present

## 2019-07-05 DIAGNOSIS — R3129 Other microscopic hematuria: Secondary | ICD-10-CM | POA: Diagnosis not present

## 2019-07-05 DIAGNOSIS — R351 Nocturia: Secondary | ICD-10-CM | POA: Diagnosis not present

## 2019-07-05 DIAGNOSIS — Z87442 Personal history of urinary calculi: Secondary | ICD-10-CM | POA: Diagnosis not present

## 2019-07-05 DIAGNOSIS — R350 Frequency of micturition: Secondary | ICD-10-CM | POA: Diagnosis not present

## 2019-07-05 DIAGNOSIS — N401 Enlarged prostate with lower urinary tract symptoms: Secondary | ICD-10-CM | POA: Diagnosis not present

## 2019-07-05 DIAGNOSIS — Z8551 Personal history of malignant neoplasm of bladder: Secondary | ICD-10-CM | POA: Diagnosis not present

## 2019-07-11 DIAGNOSIS — M9903 Segmental and somatic dysfunction of lumbar region: Secondary | ICD-10-CM | POA: Diagnosis not present

## 2019-07-11 DIAGNOSIS — M47812 Spondylosis without myelopathy or radiculopathy, cervical region: Secondary | ICD-10-CM | POA: Diagnosis not present

## 2019-07-11 DIAGNOSIS — M546 Pain in thoracic spine: Secondary | ICD-10-CM | POA: Diagnosis not present

## 2019-07-11 DIAGNOSIS — M9901 Segmental and somatic dysfunction of cervical region: Secondary | ICD-10-CM | POA: Diagnosis not present

## 2019-07-11 DIAGNOSIS — M9902 Segmental and somatic dysfunction of thoracic region: Secondary | ICD-10-CM | POA: Diagnosis not present

## 2019-07-11 DIAGNOSIS — S338XXA Sprain of other parts of lumbar spine and pelvis, initial encounter: Secondary | ICD-10-CM | POA: Diagnosis not present

## 2019-07-15 DIAGNOSIS — M9902 Segmental and somatic dysfunction of thoracic region: Secondary | ICD-10-CM | POA: Diagnosis not present

## 2019-07-15 DIAGNOSIS — M47812 Spondylosis without myelopathy or radiculopathy, cervical region: Secondary | ICD-10-CM | POA: Diagnosis not present

## 2019-07-15 DIAGNOSIS — M546 Pain in thoracic spine: Secondary | ICD-10-CM | POA: Diagnosis not present

## 2019-07-15 DIAGNOSIS — M9903 Segmental and somatic dysfunction of lumbar region: Secondary | ICD-10-CM | POA: Diagnosis not present

## 2019-07-15 DIAGNOSIS — M9901 Segmental and somatic dysfunction of cervical region: Secondary | ICD-10-CM | POA: Diagnosis not present

## 2019-07-15 DIAGNOSIS — S338XXA Sprain of other parts of lumbar spine and pelvis, initial encounter: Secondary | ICD-10-CM | POA: Diagnosis not present

## 2019-07-18 DIAGNOSIS — S338XXA Sprain of other parts of lumbar spine and pelvis, initial encounter: Secondary | ICD-10-CM | POA: Diagnosis not present

## 2019-07-18 DIAGNOSIS — M9902 Segmental and somatic dysfunction of thoracic region: Secondary | ICD-10-CM | POA: Diagnosis not present

## 2019-07-18 DIAGNOSIS — M9901 Segmental and somatic dysfunction of cervical region: Secondary | ICD-10-CM | POA: Diagnosis not present

## 2019-07-18 DIAGNOSIS — M47812 Spondylosis without myelopathy or radiculopathy, cervical region: Secondary | ICD-10-CM | POA: Diagnosis not present

## 2019-07-18 DIAGNOSIS — M9903 Segmental and somatic dysfunction of lumbar region: Secondary | ICD-10-CM | POA: Diagnosis not present

## 2019-07-18 DIAGNOSIS — M546 Pain in thoracic spine: Secondary | ICD-10-CM | POA: Diagnosis not present

## 2019-07-22 DIAGNOSIS — M9901 Segmental and somatic dysfunction of cervical region: Secondary | ICD-10-CM | POA: Diagnosis not present

## 2019-07-22 DIAGNOSIS — M9903 Segmental and somatic dysfunction of lumbar region: Secondary | ICD-10-CM | POA: Diagnosis not present

## 2019-07-22 DIAGNOSIS — M9902 Segmental and somatic dysfunction of thoracic region: Secondary | ICD-10-CM | POA: Diagnosis not present

## 2019-07-22 DIAGNOSIS — M546 Pain in thoracic spine: Secondary | ICD-10-CM | POA: Diagnosis not present

## 2019-07-22 DIAGNOSIS — S338XXA Sprain of other parts of lumbar spine and pelvis, initial encounter: Secondary | ICD-10-CM | POA: Diagnosis not present

## 2019-07-22 DIAGNOSIS — M47812 Spondylosis without myelopathy or radiculopathy, cervical region: Secondary | ICD-10-CM | POA: Diagnosis not present

## 2019-07-24 DIAGNOSIS — M9902 Segmental and somatic dysfunction of thoracic region: Secondary | ICD-10-CM | POA: Diagnosis not present

## 2019-07-24 DIAGNOSIS — M47812 Spondylosis without myelopathy or radiculopathy, cervical region: Secondary | ICD-10-CM | POA: Diagnosis not present

## 2019-07-24 DIAGNOSIS — M9901 Segmental and somatic dysfunction of cervical region: Secondary | ICD-10-CM | POA: Diagnosis not present

## 2019-07-24 DIAGNOSIS — S338XXA Sprain of other parts of lumbar spine and pelvis, initial encounter: Secondary | ICD-10-CM | POA: Diagnosis not present

## 2019-07-24 DIAGNOSIS — M9903 Segmental and somatic dysfunction of lumbar region: Secondary | ICD-10-CM | POA: Diagnosis not present

## 2019-07-24 DIAGNOSIS — M546 Pain in thoracic spine: Secondary | ICD-10-CM | POA: Diagnosis not present

## 2019-07-25 DIAGNOSIS — Z1331 Encounter for screening for depression: Secondary | ICD-10-CM | POA: Diagnosis not present

## 2019-07-25 DIAGNOSIS — Z299 Encounter for prophylactic measures, unspecified: Secondary | ICD-10-CM | POA: Diagnosis not present

## 2019-07-25 DIAGNOSIS — Z7189 Other specified counseling: Secondary | ICD-10-CM | POA: Diagnosis not present

## 2019-07-25 DIAGNOSIS — I10 Essential (primary) hypertension: Secondary | ICD-10-CM | POA: Diagnosis not present

## 2019-07-25 DIAGNOSIS — E1165 Type 2 diabetes mellitus with hyperglycemia: Secondary | ICD-10-CM | POA: Diagnosis not present

## 2019-07-25 DIAGNOSIS — Z79899 Other long term (current) drug therapy: Secondary | ICD-10-CM | POA: Diagnosis not present

## 2019-07-25 DIAGNOSIS — Z1339 Encounter for screening examination for other mental health and behavioral disorders: Secondary | ICD-10-CM | POA: Diagnosis not present

## 2019-07-25 DIAGNOSIS — C679 Malignant neoplasm of bladder, unspecified: Secondary | ICD-10-CM | POA: Diagnosis not present

## 2019-07-25 DIAGNOSIS — E7800 Pure hypercholesterolemia, unspecified: Secondary | ICD-10-CM | POA: Diagnosis not present

## 2019-07-25 DIAGNOSIS — Z1211 Encounter for screening for malignant neoplasm of colon: Secondary | ICD-10-CM | POA: Diagnosis not present

## 2019-07-25 DIAGNOSIS — Z0000 Encounter for general adult medical examination without abnormal findings: Secondary | ICD-10-CM | POA: Diagnosis not present

## 2019-07-25 DIAGNOSIS — Z125 Encounter for screening for malignant neoplasm of prostate: Secondary | ICD-10-CM | POA: Diagnosis not present

## 2019-07-25 DIAGNOSIS — Z6827 Body mass index (BMI) 27.0-27.9, adult: Secondary | ICD-10-CM | POA: Diagnosis not present

## 2019-07-29 DIAGNOSIS — M9902 Segmental and somatic dysfunction of thoracic region: Secondary | ICD-10-CM | POA: Diagnosis not present

## 2019-07-29 DIAGNOSIS — S338XXA Sprain of other parts of lumbar spine and pelvis, initial encounter: Secondary | ICD-10-CM | POA: Diagnosis not present

## 2019-07-29 DIAGNOSIS — M9901 Segmental and somatic dysfunction of cervical region: Secondary | ICD-10-CM | POA: Diagnosis not present

## 2019-07-29 DIAGNOSIS — M546 Pain in thoracic spine: Secondary | ICD-10-CM | POA: Diagnosis not present

## 2019-07-29 DIAGNOSIS — M47812 Spondylosis without myelopathy or radiculopathy, cervical region: Secondary | ICD-10-CM | POA: Diagnosis not present

## 2019-07-29 DIAGNOSIS — M9903 Segmental and somatic dysfunction of lumbar region: Secondary | ICD-10-CM | POA: Diagnosis not present

## 2019-08-01 DIAGNOSIS — M9901 Segmental and somatic dysfunction of cervical region: Secondary | ICD-10-CM | POA: Diagnosis not present

## 2019-08-01 DIAGNOSIS — S338XXA Sprain of other parts of lumbar spine and pelvis, initial encounter: Secondary | ICD-10-CM | POA: Diagnosis not present

## 2019-08-01 DIAGNOSIS — M9903 Segmental and somatic dysfunction of lumbar region: Secondary | ICD-10-CM | POA: Diagnosis not present

## 2019-08-01 DIAGNOSIS — M47812 Spondylosis without myelopathy or radiculopathy, cervical region: Secondary | ICD-10-CM | POA: Diagnosis not present

## 2019-08-01 DIAGNOSIS — M9902 Segmental and somatic dysfunction of thoracic region: Secondary | ICD-10-CM | POA: Diagnosis not present

## 2019-08-01 DIAGNOSIS — M546 Pain in thoracic spine: Secondary | ICD-10-CM | POA: Diagnosis not present

## 2019-08-06 DIAGNOSIS — M47812 Spondylosis without myelopathy or radiculopathy, cervical region: Secondary | ICD-10-CM | POA: Diagnosis not present

## 2019-08-06 DIAGNOSIS — M546 Pain in thoracic spine: Secondary | ICD-10-CM | POA: Diagnosis not present

## 2019-08-06 DIAGNOSIS — M9903 Segmental and somatic dysfunction of lumbar region: Secondary | ICD-10-CM | POA: Diagnosis not present

## 2019-08-06 DIAGNOSIS — M9901 Segmental and somatic dysfunction of cervical region: Secondary | ICD-10-CM | POA: Diagnosis not present

## 2019-08-06 DIAGNOSIS — M9902 Segmental and somatic dysfunction of thoracic region: Secondary | ICD-10-CM | POA: Diagnosis not present

## 2019-08-06 DIAGNOSIS — S338XXA Sprain of other parts of lumbar spine and pelvis, initial encounter: Secondary | ICD-10-CM | POA: Diagnosis not present

## 2019-08-13 DIAGNOSIS — M9903 Segmental and somatic dysfunction of lumbar region: Secondary | ICD-10-CM | POA: Diagnosis not present

## 2019-08-13 DIAGNOSIS — M9901 Segmental and somatic dysfunction of cervical region: Secondary | ICD-10-CM | POA: Diagnosis not present

## 2019-08-13 DIAGNOSIS — S338XXA Sprain of other parts of lumbar spine and pelvis, initial encounter: Secondary | ICD-10-CM | POA: Diagnosis not present

## 2019-08-13 DIAGNOSIS — M9902 Segmental and somatic dysfunction of thoracic region: Secondary | ICD-10-CM | POA: Diagnosis not present

## 2019-08-13 DIAGNOSIS — M546 Pain in thoracic spine: Secondary | ICD-10-CM | POA: Diagnosis not present

## 2019-08-13 DIAGNOSIS — M47812 Spondylosis without myelopathy or radiculopathy, cervical region: Secondary | ICD-10-CM | POA: Diagnosis not present

## 2019-08-15 DIAGNOSIS — M546 Pain in thoracic spine: Secondary | ICD-10-CM | POA: Diagnosis not present

## 2019-08-15 DIAGNOSIS — M9902 Segmental and somatic dysfunction of thoracic region: Secondary | ICD-10-CM | POA: Diagnosis not present

## 2019-08-15 DIAGNOSIS — S338XXA Sprain of other parts of lumbar spine and pelvis, initial encounter: Secondary | ICD-10-CM | POA: Diagnosis not present

## 2019-08-15 DIAGNOSIS — M9903 Segmental and somatic dysfunction of lumbar region: Secondary | ICD-10-CM | POA: Diagnosis not present

## 2019-08-15 DIAGNOSIS — M47812 Spondylosis without myelopathy or radiculopathy, cervical region: Secondary | ICD-10-CM | POA: Diagnosis not present

## 2019-08-15 DIAGNOSIS — M9901 Segmental and somatic dysfunction of cervical region: Secondary | ICD-10-CM | POA: Diagnosis not present

## 2019-08-16 DIAGNOSIS — M79672 Pain in left foot: Secondary | ICD-10-CM | POA: Diagnosis not present

## 2019-08-16 DIAGNOSIS — M722 Plantar fascial fibromatosis: Secondary | ICD-10-CM | POA: Diagnosis not present

## 2019-08-16 DIAGNOSIS — E1142 Type 2 diabetes mellitus with diabetic polyneuropathy: Secondary | ICD-10-CM | POA: Diagnosis not present

## 2019-08-21 DIAGNOSIS — M9903 Segmental and somatic dysfunction of lumbar region: Secondary | ICD-10-CM | POA: Diagnosis not present

## 2019-08-21 DIAGNOSIS — S338XXA Sprain of other parts of lumbar spine and pelvis, initial encounter: Secondary | ICD-10-CM | POA: Diagnosis not present

## 2019-08-21 DIAGNOSIS — M9901 Segmental and somatic dysfunction of cervical region: Secondary | ICD-10-CM | POA: Diagnosis not present

## 2019-08-21 DIAGNOSIS — I10 Essential (primary) hypertension: Secondary | ICD-10-CM | POA: Diagnosis not present

## 2019-08-21 DIAGNOSIS — Z299 Encounter for prophylactic measures, unspecified: Secondary | ICD-10-CM | POA: Diagnosis not present

## 2019-08-21 DIAGNOSIS — E1165 Type 2 diabetes mellitus with hyperglycemia: Secondary | ICD-10-CM | POA: Diagnosis not present

## 2019-08-21 DIAGNOSIS — M47812 Spondylosis without myelopathy or radiculopathy, cervical region: Secondary | ICD-10-CM | POA: Diagnosis not present

## 2019-08-21 DIAGNOSIS — M546 Pain in thoracic spine: Secondary | ICD-10-CM | POA: Diagnosis not present

## 2019-08-21 DIAGNOSIS — C679 Malignant neoplasm of bladder, unspecified: Secondary | ICD-10-CM | POA: Diagnosis not present

## 2019-08-21 DIAGNOSIS — M9902 Segmental and somatic dysfunction of thoracic region: Secondary | ICD-10-CM | POA: Diagnosis not present

## 2019-08-21 DIAGNOSIS — G4700 Insomnia, unspecified: Secondary | ICD-10-CM | POA: Diagnosis not present

## 2019-09-25 DIAGNOSIS — M7541 Impingement syndrome of right shoulder: Secondary | ICD-10-CM | POA: Diagnosis not present

## 2019-09-25 DIAGNOSIS — M25512 Pain in left shoulder: Secondary | ICD-10-CM | POA: Diagnosis not present

## 2019-09-25 DIAGNOSIS — M7542 Impingement syndrome of left shoulder: Secondary | ICD-10-CM | POA: Diagnosis not present

## 2019-09-25 DIAGNOSIS — M25511 Pain in right shoulder: Secondary | ICD-10-CM | POA: Diagnosis not present

## 2019-09-30 ENCOUNTER — Ambulatory Visit (INDEPENDENT_AMBULATORY_CARE_PROVIDER_SITE_OTHER): Payer: Medicare Other | Admitting: Gastroenterology

## 2019-09-30 ENCOUNTER — Other Ambulatory Visit (INDEPENDENT_AMBULATORY_CARE_PROVIDER_SITE_OTHER): Payer: Self-pay | Admitting: *Deleted

## 2019-09-30 ENCOUNTER — Telehealth (INDEPENDENT_AMBULATORY_CARE_PROVIDER_SITE_OTHER): Payer: Self-pay | Admitting: *Deleted

## 2019-09-30 ENCOUNTER — Other Ambulatory Visit: Payer: Self-pay

## 2019-09-30 ENCOUNTER — Encounter (INDEPENDENT_AMBULATORY_CARE_PROVIDER_SITE_OTHER): Payer: Self-pay | Admitting: *Deleted

## 2019-09-30 VITALS — BP 121/66 | HR 71 | Temp 98.1°F | Ht 70.0 in | Wt 182.5 lb

## 2019-09-30 DIAGNOSIS — M9901 Segmental and somatic dysfunction of cervical region: Secondary | ICD-10-CM | POA: Diagnosis not present

## 2019-09-30 DIAGNOSIS — M9902 Segmental and somatic dysfunction of thoracic region: Secondary | ICD-10-CM | POA: Diagnosis not present

## 2019-09-30 DIAGNOSIS — R195 Other fecal abnormalities: Secondary | ICD-10-CM | POA: Diagnosis not present

## 2019-09-30 DIAGNOSIS — Z1211 Encounter for screening for malignant neoplasm of colon: Secondary | ICD-10-CM

## 2019-09-30 DIAGNOSIS — S338XXA Sprain of other parts of lumbar spine and pelvis, initial encounter: Secondary | ICD-10-CM | POA: Diagnosis not present

## 2019-09-30 DIAGNOSIS — M47812 Spondylosis without myelopathy or radiculopathy, cervical region: Secondary | ICD-10-CM | POA: Diagnosis not present

## 2019-09-30 DIAGNOSIS — M9903 Segmental and somatic dysfunction of lumbar region: Secondary | ICD-10-CM | POA: Diagnosis not present

## 2019-09-30 DIAGNOSIS — M546 Pain in thoracic spine: Secondary | ICD-10-CM | POA: Diagnosis not present

## 2019-09-30 MED ORDER — PLENVU 140 G PO SOLR: 1.0000 | Freq: Once | ORAL | 0 refills | Status: AC

## 2019-09-30 NOTE — Progress Notes (Signed)
Patient profile: Eric [name] is a 73 y.o. male seen for evaluation of hemoccult positive at pcp.     History of Present Illness: Eric [name] is seen today and reports dark stools intermittently, also having some bright red blood in stool intermittently as well. First noted few months ago. He is having a BM 2-3x/day on average. Reports stools are usually formed consistency. Denies any diarrhea. Reports mild abd pain prior to BM at times, located in lower abd and resolves w/ defecation. Takes metamucil daily and feels this works well.   Denies any n/v. Rare GERD symptom-resolve without on demand therapy. No dysphagia. Appetite good.   Wt down about 8lbs in 3 years - denies early satiety, feels his appetite is good.    Rare nsaids. Non smoker. No frequent alcohol-1 beer a week on average. Takes magnesium 400mg  daily.   Wt Readings from Last 3 Encounters:  09/30/19 182 lb 8 oz (82.8 kg)  09/29/16 190 lb (86.2 kg)  06/29/16 190 lb 8 oz (86.4 kg)     Last Colonoscopy: - 2018- Diverticulosis in the entire examined colon. - A single (solitary) ulcer in the distal transverse colon. Biopsied. - A few erosions in the transverse colon and in the ascending colon. - External hemorrhoids. comment: Suspect NSAID induced injury.    Last Endoscopy: none prior    Past Medical History:  Past Medical History:  Diagnosis Date  . Bladder cancer ([place]) 06/29/2016   2013  . Diabetes ([place]) 06/29/2016  . Essential hypertension, benign 06/29/2016  . High cholesterol 06/29/2016  . History of kidney stones     Problem List: Patient Active Problem List   Diagnosis Date Noted  . Essential hypertension, benign 06/29/2016  . High cholesterol 06/29/2016  . Bladder cancer ([place]) 06/29/2016  . Diabetes ([place]) 06/29/2016  . Guaiac positive stools 06/29/2016    Past Surgical History: Past Surgical History:  Procedure Laterality Date  . BIOPSY  09/29/2016   Procedure: BIOPSY;   Surgeon: Rogene Houston, MD;  [facility]: [facility];  Service: Endoscopy;;  colon  . BLADDER SURGERY     2013  . CHOLECYSTECTOMY    . COLONOSCOPY N/A 09/29/2016   Procedure: COLONOSCOPY;  Surgeon: Rogene Houston, MD;  Location: [facility];  Service: Endoscopy;  Laterality: N/A;  12:00  . knee athroscopy      Allergies: No Known Allergies    Home Medications:  Current Outpatient Medications:  .  amLODipine (NORVASC) 5 MG tablet, Take 5 mg by mouth daily., Disp: , Rfl:  .  dutasteride (AVODART) 0.5 MG capsule, Take 0.5 mg by mouth daily., Disp: , Rfl:  .  ELDERBERRY PO, Take by mouth. 1 tab once a day, Disp: , Rfl:  .  Flax OIL, Take by mouth. 1 tab twice a day, Disp: , Rfl:  .  gabapentin (NEURONTIN) 300 MG capsule, Take by mouth., Disp: , Rfl:  .  ibuprofen (ADVIL,MOTRIN) 200 MG tablet, Take 2 tablets (400 mg total) by mouth daily as needed for headache or moderate pain., Disp: 30 tablet, Rfl: 0 .  lisinopril (PRINIVIL,ZESTRIL) 20 MG tablet, Take 20 mg by mouth daily., Disp: , Rfl:  .  magnesium oxide (MAG-OX) 400 MG tablet, Take 800 mg by mouth 2 (two) times daily. , Disp: , Rfl:  .  Multiple Vitamins-Minerals (MULTIVITAMIN ADULT PO), Take 1 tablet by mouth daily., Disp: , Rfl:  .  psyllium (METAMUCIL) 58.6 % packet, Take 1 packet by  mouth daily. Once a day, Disp: , Rfl:  .  rOPINIRole (REQUIP) 1 MG tablet, Take 1 mg by mouth at bedtime. , Disp: , Rfl:  .  tamsulosin (FLOMAX) 0.4 MG CAPS capsule, , Disp: , Rfl:  .  VITAMIN B COMPLEX-C PO, Take by mouth. 1 tab once a day, Disp: , Rfl:  .  vitamin B-12 (CYANOCOBALAMIN) 100 MCG tablet, Take 100 mcg by mouth daily. Once a day, Disp: , Rfl:  .  VITAMIN D PO, Take by mouth. 2,000 units once a day, Disp: , Rfl:  .  Zinc 50 MG CAPS, Take by mouth. Once a day 1 tab, Disp: , Rfl:  .  CINNAMON PO, Take 1,000 mg by mouth daily. (Patient not taking: Reported on 09/30/2019), Disp: , Rfl:  .  lovastatin (MEVACOR) 10 MG tablet, Take 10 mg by  mouth daily with supper.  (Patient not taking: Reported on 09/30/2019), Disp: , Rfl:  .  Potassium 75 MG TABS, Take 75 mg by mouth daily.  (Patient not taking: Reported on 09/30/2019), Disp: , Rfl:    Family History: Denies family hx colon polyps or colon cancer   Social History:   reports that he has never smoked. He has never used smokeless tobacco. He reports current alcohol use. He reports that he does not use drugs.   Review of Systems: Constitutional: Denies weight loss/weight gain  Eyes: No changes in vision. ENT: No oral lesions, sore throat.  GI: see HPI.  Heme/Lymph: No easy bruising.  CV: No chest pain.  GU: No hematuria.  Integumentary: No rashes.  Neuro: No headaches.  Psych: No depression/anxiety.  Endocrine: No heat/cold intolerance.  Allergic/Immunologic: No urticaria.  Resp: No cough, SOB.  Musculoskeletal: No joint swelling.    Physical Examination: BP 121/66 (BP Location: Left Arm, Patient Position: Sitting, Cuff Size: Normal)   Pulse 71   Temp 98.1 F (36.7 C) (Oral)   Ht 5\' 10"  (1.778 m)   Wt 182 lb 8 oz (82.8 kg)   BMI 26.19 kg/m  Gen: NAD, alert and oriented x 4 HEENT: PEERLA, EOMI, Neck: supple, no JVD Chest: CTA bilaterally, no wheezes, crackles, or other adventitious sounds CV: RRR, no m/g/c/r Abd: soft, NT, ND, +BS in all four quadrants; no HSM, guarding, ridigity, or rebound tenderness Ext: no edema, well perfused with 2+ pulses, Skin: no rash or lesions noted on observed skin Lymph: no noted LAD  Data Reviewed:  No labs in referral - will request recent labs from pcp.    Assessment/Plan: Mr. Eric [name] is a 74 y.o. male    Eric [name] was seen today for positive stool card.  Diagnoses and all orders for this visit:  Encounter for Hemoccult screening  Occult blood positive stool -     CBC with Differential -     COMPLETE METABOLIC PANEL WITH GFR     1. Positive hemoccult - at pcp, patient reports seeing combo of brb and dark  stools, will schedule endoscopy/colonoscopy for eval. Check cbc for evidence of anemia. No severe GI symptoms. Denies prior issues w/ sedation. HIs last endoscopy was 2018 for hemoccult positive stool   Patient denies CP, SOB, and use of blood thinners. I discussed the risks and benefits of procedure including bleeding, perforation, infection, missed lesions, medication reactions and possible hospitalization or surgery if complications. All questions answered.   I personally performed the service, non-incident to. (WP)  Laurine Blazer, [facility]

## 2019-09-30 NOTE — Telephone Encounter (Signed)
Patient needs Plenvu (copay card) ° °

## 2019-09-30 NOTE — Patient Instructions (Signed)
We are scheduling endoscopy/colonoscopy for evaluation.   We are also checking labs today - will call w/ results.

## 2019-10-01 ENCOUNTER — Encounter (INDEPENDENT_AMBULATORY_CARE_PROVIDER_SITE_OTHER): Payer: Self-pay | Admitting: Gastroenterology

## 2019-10-01 LAB — COMPLETE METABOLIC PANEL WITH GFR
AG Ratio: 2.1 (calc) (ref 1.0–2.5)
ALT: 28 U/L (ref 9–46)
AST: 22 U/L (ref 10–35)
Albumin: 4.4 g/dL (ref 3.6–5.1)
Alkaline phosphatase (APISO): 47 U/L (ref 35–144)
BUN/Creatinine Ratio: 34 (calc) — ABNORMAL HIGH (ref 6–22)
BUN: 33 mg/dL — ABNORMAL HIGH (ref 7–25)
CO2: 24 mmol/L (ref 20–32)
Calcium: 9.9 mg/dL (ref 8.6–10.3)
Chloride: 102 mmol/L (ref 98–110)
Creat: 0.96 mg/dL (ref 0.70–1.18)
GFR, Est African American: 89 mL/min/{1.73_m2} (ref >=60)
GFR, Est Non African American: 77 mL/min/{1.73_m2} (ref >=60)
Globulin: 2.1 g/dL (calc) (ref 1.9–3.7)
Glucose, Bld: 114 mg/dL (ref 65–139)
Potassium: 4.1 mmol/L (ref 3.5–5.3)
Sodium: 136 mmol/L (ref 135–146)
Total Bilirubin: 0.5 mg/dL (ref 0.2–1.2)
Total Protein: 6.5 g/dL (ref 6.1–8.1)

## 2019-10-01 LAB — CBC WITH DIFFERENTIAL/PLATELET
Absolute Monocytes: 645 cells/uL (ref 200–950)
Basophils Absolute: 60 cells/uL (ref 0–200)
Basophils Relative: 0.7 %
Eosinophils Absolute: 215 cells/uL (ref 15–500)
Eosinophils Relative: 2.5 %
HCT: 40.8 % (ref 38.5–50.0)
Hemoglobin: 13.4 g/dL (ref 13.2–17.1)
Lymphs Abs: 3079 cells/uL (ref 850–3900)
MCH: 30.4 pg (ref 27.0–33.0)
MCHC: 32.8 g/dL (ref 32.0–36.0)
MCV: 92.5 fL (ref 80.0–100.0)
MPV: 10.4 fL (ref 7.5–12.5)
Monocytes Relative: 7.5 %
Neutro Abs: 4601 cells/uL (ref 1500–7800)
Neutrophils Relative %: 53.5 %
Platelets: 196 10*3/uL (ref 140–400)
RBC: 4.41 10*6/uL (ref 4.20–5.80)
RDW: 13.4 % (ref 11.0–15.0)
Total Lymphocyte: 35.8 %
WBC: 8.6 10*3/uL (ref 3.8–10.8)

## 2019-10-09 DIAGNOSIS — M9903 Segmental and somatic dysfunction of lumbar region: Secondary | ICD-10-CM | POA: Diagnosis not present

## 2019-10-09 DIAGNOSIS — M9902 Segmental and somatic dysfunction of thoracic region: Secondary | ICD-10-CM | POA: Diagnosis not present

## 2019-10-09 DIAGNOSIS — S338XXA Sprain of other parts of lumbar spine and pelvis, initial encounter: Secondary | ICD-10-CM | POA: Diagnosis not present

## 2019-10-09 DIAGNOSIS — M546 Pain in thoracic spine: Secondary | ICD-10-CM | POA: Diagnosis not present

## 2019-10-09 DIAGNOSIS — M47812 Spondylosis without myelopathy or radiculopathy, cervical region: Secondary | ICD-10-CM | POA: Diagnosis not present

## 2019-10-09 DIAGNOSIS — M9901 Segmental and somatic dysfunction of cervical region: Secondary | ICD-10-CM | POA: Diagnosis not present

## 2019-10-23 DIAGNOSIS — M47812 Spondylosis without myelopathy or radiculopathy, cervical region: Secondary | ICD-10-CM | POA: Diagnosis not present

## 2019-10-23 DIAGNOSIS — M9903 Segmental and somatic dysfunction of lumbar region: Secondary | ICD-10-CM | POA: Diagnosis not present

## 2019-10-23 DIAGNOSIS — S338XXA Sprain of other parts of lumbar spine and pelvis, initial encounter: Secondary | ICD-10-CM | POA: Diagnosis not present

## 2019-10-23 DIAGNOSIS — M546 Pain in thoracic spine: Secondary | ICD-10-CM | POA: Diagnosis not present

## 2019-10-23 DIAGNOSIS — M9901 Segmental and somatic dysfunction of cervical region: Secondary | ICD-10-CM | POA: Diagnosis not present

## 2019-10-23 DIAGNOSIS — M9902 Segmental and somatic dysfunction of thoracic region: Secondary | ICD-10-CM | POA: Diagnosis not present

## 2019-11-12 ENCOUNTER — Other Ambulatory Visit (HOSPITAL_COMMUNITY)
Admission: RE | Admit: 2019-11-12 | Discharge: 2019-11-12 | Disposition: A | Discharge status: Home / Self Care | Payer: Medicare Other | Source: Ambulatory Visit | Attending: Internal Medicine | Admitting: Internal Medicine

## 2019-11-12 ENCOUNTER — Other Ambulatory Visit: Payer: Self-pay

## 2019-11-12 DIAGNOSIS — Z01812 Encounter for preprocedural laboratory examination: Secondary | ICD-10-CM | POA: Insufficient documentation

## 2019-11-12 DIAGNOSIS — I10 Essential (primary) hypertension: Secondary | ICD-10-CM | POA: Diagnosis not present

## 2019-11-12 DIAGNOSIS — E7849 Other hyperlipidemia: Secondary | ICD-10-CM | POA: Diagnosis not present

## 2019-11-12 DIAGNOSIS — C679 Malignant neoplasm of bladder, unspecified: Secondary | ICD-10-CM | POA: Diagnosis not present

## 2019-11-12 DIAGNOSIS — Z20822 Contact with and (suspected) exposure to covid-19: Secondary | ICD-10-CM | POA: Insufficient documentation

## 2019-11-12 LAB — SARS CORONAVIRUS 2 (TAT 6-24 HRS): SARS Coronavirus 2: NEGATIVE

## 2019-11-14 ENCOUNTER — Other Ambulatory Visit: Payer: Self-pay

## 2019-11-14 ENCOUNTER — Ambulatory Visit (HOSPITAL_COMMUNITY)
Admission: RE | Admit: 2019-11-14 | Discharge: 2019-11-14 | Disposition: A | Discharge status: Home / Self Care | Payer: Medicare Other | Attending: Internal Medicine | Admitting: Internal Medicine

## 2019-11-14 ENCOUNTER — Encounter (HOSPITAL_COMMUNITY): Payer: Self-pay | Admitting: Internal Medicine

## 2019-11-14 ENCOUNTER — Encounter (HOSPITAL_COMMUNITY)
Admission: RE | Disposition: A | Discharge status: Home / Self Care | Payer: Self-pay | Source: Home / Self Care | Attending: Internal Medicine

## 2019-11-14 DIAGNOSIS — K2980 Duodenitis without bleeding: Secondary | ICD-10-CM | POA: Diagnosis not present

## 2019-11-14 DIAGNOSIS — Z8551 Personal history of malignant neoplasm of bladder: Secondary | ICD-10-CM | POA: Diagnosis not present

## 2019-11-14 DIAGNOSIS — Z79899 Other long term (current) drug therapy: Secondary | ICD-10-CM | POA: Insufficient documentation

## 2019-11-14 DIAGNOSIS — K264 Chronic or unspecified duodenal ulcer with hemorrhage: Secondary | ICD-10-CM | POA: Diagnosis not present

## 2019-11-14 DIAGNOSIS — K2950 Unspecified chronic gastritis without bleeding: Secondary | ICD-10-CM | POA: Insufficient documentation

## 2019-11-14 DIAGNOSIS — I10 Essential (primary) hypertension: Secondary | ICD-10-CM | POA: Diagnosis not present

## 2019-11-14 DIAGNOSIS — K5730 Diverticulosis of large intestine without perforation or abscess without bleeding: Secondary | ICD-10-CM | POA: Insufficient documentation

## 2019-11-14 DIAGNOSIS — Z9049 Acquired absence of other specified parts of digestive tract: Secondary | ICD-10-CM | POA: Diagnosis not present

## 2019-11-14 DIAGNOSIS — K2970 Gastritis, unspecified, without bleeding: Secondary | ICD-10-CM | POA: Diagnosis not present

## 2019-11-14 DIAGNOSIS — R195 Other fecal abnormalities: Secondary | ICD-10-CM

## 2019-11-14 DIAGNOSIS — K648 Other hemorrhoids: Secondary | ICD-10-CM | POA: Insufficient documentation

## 2019-11-14 DIAGNOSIS — K228 Other specified diseases of esophagus: Secondary | ICD-10-CM | POA: Insufficient documentation

## 2019-11-14 DIAGNOSIS — E119 Type 2 diabetes mellitus without complications: Secondary | ICD-10-CM | POA: Insufficient documentation

## 2019-11-14 DIAGNOSIS — E7800 Pure hypercholesterolemia, unspecified: Secondary | ICD-10-CM | POA: Insufficient documentation

## 2019-11-14 DIAGNOSIS — K5732 Diverticulitis of large intestine without perforation or abscess without bleeding: Secondary | ICD-10-CM | POA: Diagnosis not present

## 2019-11-14 DIAGNOSIS — K625 Hemorrhage of anus and rectum: Secondary | ICD-10-CM | POA: Diagnosis not present

## 2019-11-14 DIAGNOSIS — K6389 Other specified diseases of intestine: Secondary | ICD-10-CM | POA: Diagnosis not present

## 2019-11-14 DIAGNOSIS — K269 Duodenal ulcer, unspecified as acute or chronic, without hemorrhage or perforation: Secondary | ICD-10-CM | POA: Diagnosis not present

## 2019-11-14 DIAGNOSIS — K921 Melena: Secondary | ICD-10-CM | POA: Diagnosis not present

## 2019-11-14 HISTORY — PX: ESOPHAGOGASTRODUODENOSCOPY: SHX5428

## 2019-11-14 HISTORY — PX: BIOPSY: SHX5522

## 2019-11-14 HISTORY — PX: COLONOSCOPY: SHX5424

## 2019-11-14 LAB — HM COLONOSCOPY

## 2019-11-14 SURGERY — COLONOSCOPY
Anesthesia: Moderate Sedation

## 2019-11-14 MED ORDER — MEPERIDINE HCL 50 MG/ML IJ SOLN
INTRAMUSCULAR | Status: DC | PRN
Start: 2019-11-14 — End: 2019-11-14
  Administered 2019-11-14 (×2): 25 mg

## 2019-11-14 MED ORDER — LIDOCAINE VISCOUS HCL 2 % MT SOLN
OROMUCOSAL | Status: DC | PRN
  Administered 2019-11-14: 1 via OROMUCOSAL

## 2019-11-14 MED ORDER — MEPERIDINE HCL 50 MG/ML IJ SOLN
INTRAMUSCULAR | Status: AC
  Filled 2019-11-14: qty 1

## 2019-11-14 MED ORDER — SODIUM CHLORIDE 0.9 % IV SOLN: INTRAVENOUS | Status: DC

## 2019-11-14 MED ORDER — MIDAZOLAM HCL 5 MG/5ML IJ SOLN
INTRAMUSCULAR | Status: DC | PRN
  Administered 2019-11-14: 2 mg via INTRAVENOUS
  Administered 2019-11-14: 1 mg via INTRAVENOUS
  Administered 2019-11-14: 2 mg via INTRAVENOUS
  Administered 2019-11-14 (×3): 1 mg via INTRAVENOUS

## 2019-11-14 MED ORDER — STERILE WATER FOR IRRIGATION IR SOLN
Status: DC | PRN
  Administered 2019-11-14: 1.5 mL

## 2019-11-14 MED ORDER — MIDAZOLAM HCL 5 MG/5ML IJ SOLN
INTRAMUSCULAR | Status: AC
  Filled 2019-11-14: qty 10

## 2019-11-14 MED ORDER — CIPROFLOXACIN HCL 500 MG PO TABS: 500.0000 mg | ORAL_TABLET | Freq: Two times a day (BID) | ORAL | 1 refills | Status: AC

## 2019-11-14 MED ORDER — METRONIDAZOLE 500 MG PO TABS: 500.0000 mg | ORAL_TABLET | Freq: Two times a day (BID) | ORAL | 1 refills | Status: AC

## 2019-11-14 MED ORDER — LIDOCAINE VISCOUS HCL 2 % MT SOLN
OROMUCOSAL | Status: AC
  Filled 2019-11-14: qty 15

## 2019-11-14 NOTE — Discharge Instructions (Signed)
No aspirin or NSAIDs for 24 hours. Resume usual medications as before. Cipro 500 mg by mouth twice daily for 1 week Metronidazole 500 mg by mouth twice daily for 1 week. Can take MiraLAX/polyethylene glycol half a scoop daily or every other day as needed for constipation. High-fiber diet. No driving for 24 hours. Physician will call with biopsy results.   Colonoscopy, Adult, Care After This sheet gives you information about how to care for yourself after your procedure. Your doctor may also give you more specific instructions. If you have problems or questions, call your doctor. What can I expect after the procedure? After the procedure, it is common to have:  A small amount of blood in your poop (stool) for 24 hours.  Some gas.  Mild cramping or bloating in your belly (abdomen). Follow these instructions at home: Eating and drinking   Drink enough fluid to keep your pee (urine) pale yellow.  Follow instructions from your doctor about what you cannot eat or drink.  Return to your normal diet as told by your doctor. Avoid heavy or fried foods that are hard to digest. Activity  Rest as told by your doctor.  Do not sit for a long time without moving. Get up to take short walks every 1-2 hours. This is important. Ask for help if you feel weak or unsteady.  Return to your normal activities as told by your doctor. Ask your doctor what activities are safe for you. To help cramping and bloating:   Try walking around.  Put heat on your belly as told by your doctor. Use the heat source that your doctor recommends, such as a moist heat pack or a heating pad. ? Put a towel between your skin and the heat source. ? Leave the heat on for 20-30 minutes. ? Remove the heat if your skin turns bright red. This is very important if you are unable to feel pain, heat, or cold. You may have a greater risk of getting burned. General instructions  For the first 24 hours after the  procedure: ? Do not drive or use machinery. ? Do not sign important documents. ? Do not drink alcohol. ? Do your daily activities more slowly than normal. ? Eat foods that are soft and easy to digest.  Take over-the-counter or prescription medicines only as told by your doctor.  Keep all follow-up visits as told by your doctor. This is important. Contact a doctor if:  You have blood in your poop 2-3 days after the procedure. Get help right away if:  You have more than a small amount of blood in your poop.  You see large clumps of tissue (blood clots) in your poop.  Your belly is swollen.  You feel like you may vomit (nauseous).  You vomit.  You have a fever.  You have belly pain that gets worse, and medicine does not help your pain. Summary  After the procedure, it is common to have a small amount of blood in your poop. You may also have mild cramping and bloating in your belly.  For the first 24 hours after the procedure, do not drive or use machinery, do not sign important documents, and do not drink alcohol.  Get help right away if you have a lot of blood in your poop, feel like you may vomit, have a fever, or have more belly pain. This information is not intended to replace advice given to you by your health care provider. Make sure you  discuss any questions you have with your health care provider. Document Revised: 09/24/2018 Document Reviewed: 09/24/2018 Elsevier Patient Education  [place].  Diverticulitis  Diverticulitis is when small pockets in your large intestine (colon) get infected or swollen. This causes stomach pain and watery poop (diarrhea). These pouches are called diverticula. They form in people who have a condition called diverticulosis. Follow these instructions at home: Medicines  Take over-the-counter and prescription medicines only as told by your doctor. These include: ? Antibiotics. ? Pain medicines. ? Fiber  pills. ? Probiotics. ? Stool softeners.  Do not drive or use heavy machinery while taking prescription pain medicine.  If you were prescribed an antibiotic, take it as told. Do not stop taking it even if you feel better. General instructions   Follow a diet as told by your doctor.  When you feel better, your doctor may tell you to change your diet. You may need to eat a lot of fiber. Fiber makes it easier to poop (have bowel movements). Healthy foods with fiber include: ? Berries. ? Beans. ? Lentils. ? Green vegetables.  Exercise 3 or more times a week. Aim for 30 minutes each time. Exercise enough to sweat and make your heart beat faster.  Keep all follow-up visits as told. This is important. You may need to have an exam of the large intestine. This is called a colonoscopy. Contact a doctor if:  Your pain does not get better.  You have a hard time eating or drinking.  You are not pooping like normal. Get help right away if:  Your pain gets worse.  Your problems do not get better.  Your problems get worse very fast.  You have a fever.  You throw up (vomit) more than one time.  You have poop that is: ? Bloody. ? Black. ? Tarry. Summary  Diverticulitis is when small pockets in your large intestine (colon) get infected or swollen.  Take medicines only as told by your doctor.  Follow a diet as told by your doctor. This information is not intended to replace advice given to you by your health care provider. Make sure you discuss any questions you have with your health care provider. Document Revised: 02/10/2017 Document Reviewed: 03/17/2016 Elsevier Patient Education  [place].  Diverticulosis  Diverticulosis is a condition that develops when small pouches (diverticula) form in the wall of the large intestine (colon). The colon is where water is absorbed and stool (feces) is formed. The pouches form when the inside layer of the colon pushes through weak  spots in the outer layers of the colon. You may have a few pouches or many of them. The pouches usually do not cause problems unless they become inflamed or infected. When this happens, the condition is called diverticulitis. What are the causes? The cause of this condition is not known. What increases the risk? The following factors may make you more likely to develop this condition:  Being older than age 37. Your risk for this condition increases with age. Diverticulosis is rare among people younger than age 16. By age 81, many people have it.  Eating a low-fiber diet.  Having frequent constipation.  Being overweight.  Not getting enough exercise.  Smoking.  Taking over-the-counter pain medicines, like aspirin and ibuprofen.  Having a family history of diverticulosis. What are the signs or symptoms? In most people, there are no symptoms of this condition. If you do have symptoms, they may include:  Bloating.  Cramps  in the abdomen.  Constipation or diarrhea.  Pain in the lower left side of the abdomen. How is this diagnosed? Because diverticulosis usually has no symptoms, it is most often diagnosed during an exam for other colon problems. The condition may be diagnosed by:  Using a flexible scope to examine the colon (colonoscopy).  Taking an X-ray of the colon after dye has been put into the colon (barium enema).  Having a CT scan. How is this treated? You may not need treatment for this condition. Your health care provider may recommend treatment to prevent problems. You may need treatment if you have symptoms or if you previously had diverticulitis. Treatment may include:  Eating a high-fiber diet.  Taking a fiber supplement.  Taking a live bacteria supplement (probiotic).  Taking medicine to relax your colon. Follow these instructions at home: Medicines  Take over-the-counter and prescription medicines only as told by your health care provider.  If told by  your health care provider, take a fiber supplement or probiotic. Constipation prevention Your condition may cause constipation. To prevent or treat constipation, you may need to:  Drink enough fluid to keep your urine pale yellow.  Take over-the-counter or prescription medicines.  Eat foods that are high in fiber, such as beans, whole grains, and fresh fruits and vegetables.  Limit foods that are high in fat and processed sugars, such as fried or sweet foods.  General instructions  Try not to strain when you have a bowel movement.  Keep all follow-up visits as told by your health care provider. This is important. Contact a health care provider if you:  Have pain in your abdomen.  Have bloating.  Have cramps.  Have not had a bowel movement in 3 days. Get help right away if:  Your pain gets worse.  Your bloating becomes very bad.  You have a fever or chills, and your symptoms suddenly get worse.  You vomit.  You have bowel movements that are bloody or black.  You have bleeding from your rectum. Summary  Diverticulosis is a condition that develops when small pouches (diverticula) form in the wall of the large intestine (colon).  You may have a few pouches or many of them.  This condition is most often diagnosed during an exam for other colon problems.  Treatment may include increasing the fiber in your diet, taking supplements, or taking medicines. This information is not intended to replace advice given to you by your health care provider. Make sure you discuss any questions you have with your health care provider. Document Revised: 09/27/2018 Document Reviewed: 09/27/2018 Elsevier Patient Education  [place].  Upper Endoscopy, Adult, Care After This sheet gives you information about how to care for yourself after your procedure. Your health care provider may also give you more specific instructions. If you have problems or questions, contact your health  care provider. What can I expect after the procedure? After the procedure, it is common to have:  A sore throat.  Mild stomach pain or discomfort.  Bloating.  Nausea. Follow these instructions at home:   Follow instructions from your health care provider about what to eat or drink after your procedure.  Return to your normal activities as told by your health care provider. Ask your health care provider what activities are safe for you.  Take over-the-counter and prescription medicines only as told by your health care provider.  Do not drive for 24 hours if you were given a sedative during your  procedure.  Keep all follow-up visits as told by your health care provider. This is important. Contact a health care provider if you have:  A sore throat that lasts longer than one day.  Trouble swallowing. Get help right away if:  You vomit blood or your vomit looks like coffee grounds.  You have: ? A fever. ? Bloody, black, or tarry stools. ? A severe sore throat or you cannot swallow. ? Difficulty breathing. ? Severe pain in your chest or abdomen. Summary  After the procedure, it is common to have a sore throat, mild stomach discomfort, bloating, and nausea.  Do not drive for 24 hours if you were given a sedative during the procedure.  Follow instructions from your health care provider about what to eat or drink after your procedure.  Return to your normal activities as told by your health care provider. This information is not intended to replace advice given to you by your health care provider. Make sure you discuss any questions you have with your health care provider. Document Revised: 08/22/2017 Document Reviewed: 07/31/2017 Elsevier Patient Education  [place].

## 2019-11-14 NOTE — Op Note (Addendum)
[facility] Patient Name: Eric [name] Procedure Date: 11/14/2019 7:05 AM MRN: [id number] Date of Birth: [birth year] Attending MD: Hildred Laser , MD CSN: [id number] Age: 74 Admit Type: Outpatient Procedure:                Upper GI endoscopy Indications:              Melena Providers:                Hildred Laser, MD, Janeece Riggers, RN, Lambert Mody, Casimer Bilis, Technician, Aram Candela Referring MD:              Medicines:                Lidocaine spray, Meperidine 50 mg IV, Midazolam 6                            mg IV Complications:            No immediate complications. Estimated Blood Loss:     Estimated blood loss was minimal. Procedure:                Pre-Anesthesia Assessment:                           - Prior to the procedure, a History and Physical                            was performed, and patient medications and                            allergies were reviewed. The patient's tolerance of                            previous anesthesia was also reviewed. The risks                            and benefits of the procedure and the sedation                            options and risks were discussed with the patient.                            All questions were answered, and informed consent                            was obtained. Prior Anticoagulants: The patient has                            taken no previous anticoagulant or antiplatelet                            agents except for  aspirin. ASA Grade Assessment: II                            - A patient with mild systemic disease. After                            reviewing the risks and benefits, the patient was                            deemed in satisfactory condition to undergo the                            procedure.                           After obtaining informed consent, the endoscope was                            passed under  direct vision. Throughout the                            procedure, the patient's blood pressure, pulse, and                            oxygen saturations were monitored continuously. The                            GIF-H190 ([id number]) scope was introduced through the                            mouth, and advanced to the second part of duodenum.                            The upper GI endoscopy was accomplished without                            difficulty. The patient tolerated the procedure                            well. Scope In: 7:40:13 AM Scope Out: 7:49:34 AM Total Procedure Duration: 0 hours 9 minutes 21 seconds  Findings:      The hypopharynx was normal.      The examined esophagus was normal.      The Z-line was irregular and was found 40 cm from the incisors.      Patchy mild inflammation characterized by congestion (edema) and       erythema was found in the gastric antrum. Biopsies were taken with a       cold forceps for histology. The pathology specimen was placed into       Bottle Number 1.      The exam of the stomach was otherwise normal.      The duodenal bulb was normal.      A few erosions without bleeding were found in the second portion of the       duodenum. Biopsies were taken with a  cold forceps for histology. The       pathology specimen was placed into Bottle Number 2. Impression:               - Normal hypopharynx.                           - Normal esophagus.                           - Z-line irregular, 40 cm from the incisors.                           - Gastritis. Biopsied.                           - Normal duodenal bulb.                           - Duodenal erosions without bleeding. Biopsied. Moderate Sedation:      Moderate (conscious) sedation was administered by the endoscopy nurse       and supervised by the endoscopist. The following parameters were       monitored: oxygen saturation, heart rate, blood pressure, CO2       capnography and  response to care. Total physician intraservice time was       16 minutes. Recommendation:           - Patient has a contact number available for                            emergencies. The signs and symptoms of potential                            delayed complications were discussed with the                            patient. Return to normal activities tomorrow.                            Written discharge instructions were provided to the                            patient.                           - Resume previous diet today.                           - Continue present medications.                           - No aspirin, ibuprofen, naproxen, or other                            non-steroidal anti-inflammatory drugs for 1 day.                           - Await pathology results. Procedure  Code(s):        --- Professional ---                           [phone], Esophagogastroduodenoscopy, flexible,                            transoral; with biopsy, single or multiple                           G0500, Moderate sedation services provided by the                            same physician or other qualified health care                            professional performing a gastrointestinal                            endoscopic service that sedation supports,                            requiring the presence of an independent trained                            observer to assist in the monitoring of the                            patient's level of consciousness and physiological                            status; initial 15 minutes of intra-service time;                            patient age 71 years or older (additional time may                            be reported with [phone], as appropriate) Diagnosis Code(s):        --- Professional ---                           K22.8, Other specified diseases of esophagus                           K29.70, Gastritis, unspecified, without bleeding                            K26.9, Duodenal ulcer, unspecified as acute or                            chronic, without hemorrhage or perforation                           K92.1, Melena (includes Hematochezia) CPT copyright 2019 American Medical Association. All rights reserved. The codes documented in this report are preliminary and upon coder review  may  be revised to meet current compliance requirements. Hildred Laser, MD Hildred Laser, MD 11/14/2019 8:18:40 AM This report has been signed electronically. Number of Addenda: 0

## 2019-11-14 NOTE — H&P (Signed)
Eric [name] is an 74 y.o. male.   Chief Complaint: Patient is here for esophagogastroduodenoscopy and colonoscopy. HPI: Patient is 74 year old Caucasian male who has a history of colonic ulcer noted on colonoscopy of July 2018 who presents with intermittent tarry stool as well as bright red blood per rectum.  He has noted occasional constipation and LLQ abdominal pain.  He states he has lost some weight but he feels as because he has been doing a lot of physical work.  He does not take ibuprofen anymore.  He may take aspirin once in a while.  He denies heartburn dysphagia or epigastric pain.  Family history is negative for CRC. 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  Past Medical History:  Diagnosis Date  . Bladder cancer ([place]) 06/29/2016   2013  . Diabetes ([place]) 06/29/2016  . Essential hypertension, benign 06/29/2016  . High cholesterol 06/29/2016  . History of kidney stones     Past Surgical History:  Procedure Laterality Date  . BIOPSY  09/29/2016   Procedure: BIOPSY;  Surgeon: Rogene Houston, MD;  Location: [facility];  Service: Endoscopy;;  colon  . BLADDER SURGERY     2013  . CHOLECYSTECTOMY    . COLONOSCOPY N/A 09/29/2016   Procedure: COLONOSCOPY;  Surgeon: Rogene Houston, MD;  Location: [facility];  Service: Endoscopy;  Laterality: N/A;  12:00  . knee athroscopy      Family History  Problem Relation Age of Onset  . Colon cancer Neg Hx    Social History:  reports that he has never smoked. He has never used smokeless tobacco. He reports current alcohol use. He reports that he does not use drugs.  Allergies: No Known Allergies  Medications Prior to Admission  Medication Sig Dispense Refill  . ALPRAZolam (XANAX) 0.5 MG tablet Take 0.5 mg by mouth at bedtime as needed for sleep.    Marland Kitchen amLODipine (NORVASC) 5 MG tablet Take 5 mg by mouth daily.    . B Complex-C (B-COMPLEX WITH VITAMIN C) tablet Take 1 tablet by mouth  daily.    . Cholecalciferol (VITAMIN D3) 50 MCG (2000 UT) TABS Take 2,000 Units by mouth daily.    . Cyanocobalamin (VITAMIN B-12) 5000 MCG SUBL Take 5,000 mcg by mouth daily.    Marland Kitchen dutasteride (AVODART) 0.5 MG capsule Take 0.5 mg by mouth daily.    Marland Kitchen ELDERBERRY PO Take 1 tablet by mouth daily. Gummies    . Flaxseed, Linseed, (FLAXSEED OIL PO) Take 1 capsule by mouth in the morning and at bedtime.    . gabapentin (NEURONTIN) 300 MG capsule Take 300 mg by mouth in the morning, at noon, and at bedtime.     Marland Kitchen ibuprofen (ADVIL,MOTRIN) 200 MG tablet Take 400 mg by mouth every 8 (eight) hours as needed for headache or moderate pain (for pain.).  30 tablet 0  . lisinopril (PRINIVIL,ZESTRIL) 20 MG tablet Take 20 mg by mouth daily.    . Magnesium 250 MG TABS Take 250 mg by mouth in the morning and at bedtime.    . Misc Natural Products (BLOOD SUGAR BALANCE) TABS Take 1 tablet by mouth in the morning and at bedtime. Glucocil    . Multiple Vitamin (MULTIVITAMIN WITH MINERALS) TABS tablet Take 1 tablet by mouth in the morning and at bedtime.    . Potassium 99 MG TABS Take 99 mg by mouth daily.    . tamsulosin (FLOMAX) 0.4 MG CAPS capsule  Take 0.4 mg by mouth daily.     . temazepam (RESTORIL) 15 MG capsule Take 15 mg by mouth at bedtime as needed for sleep.    Marland Kitchen Zinc 50 MG TABS Take 50 mg by mouth in the morning and at bedtime.      Results for orders placed or performed during the hospital encounter of 11/12/19 (from the past 48 hour(s))  SARS CORONAVIRUS 2 (TAT 6-24 HRS) Nasopharyngeal Nasopharyngeal Swab     Status: None   Collection Time: 11/12/19  8:08 AM   Specimen: Nasopharyngeal Swab  Result Value Ref Range   SARS Coronavirus 2 NEGATIVE NEGATIVE    Comment: (NOTE) SARS-CoV-2 target nucleic acids are NOT DETECTED.  The SARS-CoV-2 RNA is generally detectable in upper and lower respiratory specimens during the acute phase of infection. Negative results do not preclude SARS-CoV-2 infection, do not  rule out co-infections with other pathogens, and should not be used as the sole basis for treatment or other patient management decisions. Negative results must be combined with clinical observations, patient history, and epidemiological information. The expected result is Negative.  Fact Sheet for Patients: SugarRoll.be  Fact Sheet for Healthcare Providers: https://www.woods-mathews.com/  This test is not yet approved or cleared by the [place] FDA and  has been authorized for detection and/or diagnosis of SARS-CoV-2 by FDA under an Emergency Use Authorization (EUA). This EUA will remain  in effect (meaning this test can be used) for the duration of the COVID-19 declaration under Se ction 564(b)(1) of the Act, 21 U.S.C. section 360bbb-3(b)(1), unless the authorization is terminated or revoked sooner.  Performed at [facility], [place] [address] [place], [place] [address]    No results found.  Review of Systems  Blood pressure (!) 164/69, pulse 72, temperature 98.2 F (36.8 C), temperature source Oral, resp. rate (!) 22, height 5\' 10"  (1.778 m), SpO2 100 %. Physical Exam HENT:     Mouth/Throat:     Mouth: Mucous membranes are moist.     Pharynx: Oropharynx is clear.  Eyes:     General: No scleral icterus.    Conjunctiva/sclera: Conjunctivae normal.  Cardiovascular:     Rate and Rhythm: Normal rate and regular rhythm.     Heart sounds: Normal heart sounds. No murmur heard.   Pulmonary:     Effort: Pulmonary effort is normal.     Breath sounds: Normal breath sounds.  Abdominal:     General: There is no distension.     Palpations: Abdomen is soft.     Tenderness: There is no abdominal tenderness.  Musculoskeletal:        General: No deformity.     Cervical back: Neck supple.  Lymphadenopathy:     Cervical: No cervical adenopathy.  Skin:    General: Skin is warm and dry.  Neurological:     Mental Status: He is  alert.      Assessment/Plan  History of melena and rectal bleeding. History of colonic ulcer. Diagnostic esophagogastroduodenoscopy and colonoscopy.  Hildred Laser, MD 11/14/2019, 7:28 AM

## 2019-11-14 NOTE — Op Note (Signed)
[facility] Patient Name: Eric [name] Procedure Date: 11/14/2019 7:52 AM MRN: [id number] Date of Birth: [birth year] Attending MD: Hildred Laser , MD CSN: [id number] Age: 74 Admit Type: Outpatient Procedure:                Colonoscopy Indications:              Rectal bleeding Providers:                Hildred Laser, MD, Janeece Riggers, RN, Lambert Mody, Casimer Bilis, Technician, Aram Candela Referring MD:             Arsenio Katz, NP Medicines:                Midazolam 2 mg IV Complications:            No immediate complications. Estimated Blood Loss:     Estimated blood loss: none. Procedure:                Pre-Anesthesia Assessment:                           - Prior to the procedure, a History and Physical                            was performed, and patient medications and                            allergies were reviewed. The patient's tolerance of                            previous anesthesia was also reviewed. The risks                            and benefits of the procedure and the sedation                            options and risks were discussed with the patient.                            All questions were answered, and informed consent                            was obtained. Prior Anticoagulants: The patient has                            taken no previous anticoagulant or antiplatelet                            agents except for aspirin. ASA Grade Assessment: II                            -  A patient with mild systemic disease. After                            reviewing the risks and benefits, the patient was                            deemed in satisfactory condition to undergo the                            procedure.                           After obtaining informed consent, the colonoscope                            was passed under direct vision. Throughout the                             procedure, the patient's blood pressure, pulse, and                            oxygen saturations were monitored continuously. The                            PCF-[id number] ([id number]) was introduced through the                            anus and advanced to the the cecum, identified by                            appendiceal orifice and ileocecal valve. The                            colonoscopy was performed without difficulty. The                            patient tolerated the procedure well. The quality                            of the bowel preparation was good. The ileocecal                            valve, appendiceal orifice, and rectum were                            photographed. Scope In: 7:53:43 AM Scope Out: 8:07:23 AM Scope Withdrawal Time: 0 hours 7 minutes 38 seconds  Total Procedure Duration: 0 hours 13 minutes 40 seconds  Findings:      The perianal and digital rectal examinations were normal.      Multiple small and large-mouthed diverticula were found in the sigmoid       colon.      Scattered diverticula were found in the descending colon, transverse       colon and hepatic flexure.      An area of mildly  congested mucosa with mucopurulent was found in the       distal sigmoid colon.      Internal hemorrhoids were found during retroflexion. The hemorrhoids       were small. Impression:               - Diverticulosis in the sigmoid colon.                           - Diverticulosis in the descending colon, in the                            transverse colon and at the hepatic flexure.                           - Sigmoid diverticulitis.                           - Internal hemorrhoids.                           - No specimens collected. Moderate Sedation:      Moderate (conscious) sedation was administered by the endoscopy nurse       and supervised by the endoscopist. The following parameters were       monitored: oxygen saturation, heart rate, blood pressure,  CO2       capnography and response to care. Total physician intraservice time was       14 minutes. Recommendation:           - Patient has a contact number available for                            emergencies. The signs and symptoms of potential                            delayed complications were discussed with the                            patient. Return to normal activities tomorrow.                            Written discharge instructions were provided to the                            patient.                           - Resume previous diet today.                           - Continue present medications.                           - Cipro 500 mg po bid for 7 days.                           - Metronidazole 500 mg po bid for 7 days.                           -  No repeat colonoscopy due to current age (76                            years or older) and the absence of advanced                            adenomas. Procedure Code(s):        --- Professional ---                           [phone], Colonoscopy, flexible; diagnostic, including                            collection of specimen(s) by brushing or washing,                            when performed (separate procedure)                           G0500, Moderate sedation services provided by the                            same physician or other qualified health care                            professional performing a gastrointestinal                            endoscopic service that sedation supports,                            requiring the presence of an independent trained                            observer to assist in the monitoring of the                            patient's level of consciousness and physiological                            status; initial 15 minutes of intra-service time;                            patient age 8 years or older (additional time may                            be reported with [phone], as  appropriate) Diagnosis Code(s):        --- Professional ---                           K64.8, Other hemorrhoids                           K63.89, Other specified diseases of intestine  K62.5, Hemorrhage of anus and rectum                           K57.30, Diverticulosis of large intestine without                            perforation or abscess without bleeding CPT copyright 2019 [employer]. All rights reserved. The codes documented in this report are preliminary and upon coder review may  be revised to meet current compliance requirements. Hildred Laser, MD Hildred Laser, MD 11/14/2019 8:25:55 AM This report has been signed electronically. Number of Addenda: 0

## 2019-11-15 ENCOUNTER — Other Ambulatory Visit: Payer: Self-pay

## 2019-11-15 LAB — SURGICAL PATHOLOGY

## 2019-11-19 ENCOUNTER — Encounter (HOSPITAL_COMMUNITY): Payer: Self-pay | Admitting: Internal Medicine

## 2019-11-26 ENCOUNTER — Other Ambulatory Visit (INDEPENDENT_AMBULATORY_CARE_PROVIDER_SITE_OTHER): Payer: Self-pay | Admitting: *Deleted

## 2019-11-26 DIAGNOSIS — R195 Other fecal abnormalities: Secondary | ICD-10-CM

## 2019-12-06 ENCOUNTER — Encounter (INDEPENDENT_AMBULATORY_CARE_PROVIDER_SITE_OTHER): Payer: Self-pay | Admitting: *Deleted

## 2019-12-19 DIAGNOSIS — M47816 Spondylosis without myelopathy or radiculopathy, lumbar region: Secondary | ICD-10-CM | POA: Diagnosis not present

## 2019-12-19 DIAGNOSIS — M9903 Segmental and somatic dysfunction of lumbar region: Secondary | ICD-10-CM | POA: Diagnosis not present

## 2019-12-19 DIAGNOSIS — M5442 Lumbago with sciatica, left side: Secondary | ICD-10-CM | POA: Diagnosis not present

## 2019-12-19 DIAGNOSIS — M47812 Spondylosis without myelopathy or radiculopathy, cervical region: Secondary | ICD-10-CM | POA: Diagnosis not present

## 2019-12-19 DIAGNOSIS — M9902 Segmental and somatic dysfunction of thoracic region: Secondary | ICD-10-CM | POA: Diagnosis not present

## 2019-12-19 DIAGNOSIS — M9901 Segmental and somatic dysfunction of cervical region: Secondary | ICD-10-CM | POA: Diagnosis not present

## 2020-01-02 DIAGNOSIS — M5442 Lumbago with sciatica, left side: Secondary | ICD-10-CM | POA: Diagnosis not present

## 2020-01-02 DIAGNOSIS — M9901 Segmental and somatic dysfunction of cervical region: Secondary | ICD-10-CM | POA: Diagnosis not present

## 2020-01-02 DIAGNOSIS — M9903 Segmental and somatic dysfunction of lumbar region: Secondary | ICD-10-CM | POA: Diagnosis not present

## 2020-01-02 DIAGNOSIS — M47812 Spondylosis without myelopathy or radiculopathy, cervical region: Secondary | ICD-10-CM | POA: Diagnosis not present

## 2020-01-02 DIAGNOSIS — M9902 Segmental and somatic dysfunction of thoracic region: Secondary | ICD-10-CM | POA: Diagnosis not present

## 2020-01-02 DIAGNOSIS — M47816 Spondylosis without myelopathy or radiculopathy, lumbar region: Secondary | ICD-10-CM | POA: Diagnosis not present

## 2020-01-15 DIAGNOSIS — M47816 Spondylosis without myelopathy or radiculopathy, lumbar region: Secondary | ICD-10-CM | POA: Diagnosis not present

## 2020-01-15 DIAGNOSIS — M9901 Segmental and somatic dysfunction of cervical region: Secondary | ICD-10-CM | POA: Diagnosis not present

## 2020-01-15 DIAGNOSIS — M5442 Lumbago with sciatica, left side: Secondary | ICD-10-CM | POA: Diagnosis not present

## 2020-01-15 DIAGNOSIS — M47812 Spondylosis without myelopathy or radiculopathy, cervical region: Secondary | ICD-10-CM | POA: Diagnosis not present

## 2020-01-15 DIAGNOSIS — M9903 Segmental and somatic dysfunction of lumbar region: Secondary | ICD-10-CM | POA: Diagnosis not present

## 2020-01-15 DIAGNOSIS — M9902 Segmental and somatic dysfunction of thoracic region: Secondary | ICD-10-CM | POA: Diagnosis not present

## 2020-01-28 DIAGNOSIS — M9902 Segmental and somatic dysfunction of thoracic region: Secondary | ICD-10-CM | POA: Diagnosis not present

## 2020-01-28 DIAGNOSIS — M9903 Segmental and somatic dysfunction of lumbar region: Secondary | ICD-10-CM | POA: Diagnosis not present

## 2020-01-28 DIAGNOSIS — M5442 Lumbago with sciatica, left side: Secondary | ICD-10-CM | POA: Diagnosis not present

## 2020-01-28 DIAGNOSIS — M9901 Segmental and somatic dysfunction of cervical region: Secondary | ICD-10-CM | POA: Diagnosis not present

## 2020-01-28 DIAGNOSIS — M47812 Spondylosis without myelopathy or radiculopathy, cervical region: Secondary | ICD-10-CM | POA: Diagnosis not present

## 2020-01-28 DIAGNOSIS — M47816 Spondylosis without myelopathy or radiculopathy, lumbar region: Secondary | ICD-10-CM | POA: Diagnosis not present

## 2020-02-03 ENCOUNTER — Other Ambulatory Visit (INDEPENDENT_AMBULATORY_CARE_PROVIDER_SITE_OTHER): Payer: Self-pay | Admitting: *Deleted

## 2020-02-03 DIAGNOSIS — R195 Other fecal abnormalities: Secondary | ICD-10-CM

## 2020-02-04 DIAGNOSIS — M5442 Lumbago with sciatica, left side: Secondary | ICD-10-CM | POA: Diagnosis not present

## 2020-02-04 DIAGNOSIS — M47812 Spondylosis without myelopathy or radiculopathy, cervical region: Secondary | ICD-10-CM | POA: Diagnosis not present

## 2020-02-04 DIAGNOSIS — M9901 Segmental and somatic dysfunction of cervical region: Secondary | ICD-10-CM | POA: Diagnosis not present

## 2020-02-04 DIAGNOSIS — M47816 Spondylosis without myelopathy or radiculopathy, lumbar region: Secondary | ICD-10-CM | POA: Diagnosis not present

## 2020-02-04 DIAGNOSIS — M9902 Segmental and somatic dysfunction of thoracic region: Secondary | ICD-10-CM | POA: Diagnosis not present

## 2020-02-04 DIAGNOSIS — M9903 Segmental and somatic dysfunction of lumbar region: Secondary | ICD-10-CM | POA: Diagnosis not present

## 2020-02-09 DIAGNOSIS — S0990XA Unspecified injury of head, initial encounter: Secondary | ICD-10-CM

## 2020-02-09 HISTORY — DX: Unspecified injury of head, initial encounter: S09.90XA

## 2020-02-11 ENCOUNTER — Other Ambulatory Visit: Payer: Self-pay

## 2020-02-11 ENCOUNTER — Encounter (HOSPITAL_COMMUNITY): Payer: Self-pay | Admitting: Emergency Medicine

## 2020-02-11 ENCOUNTER — Emergency Department (HOSPITAL_COMMUNITY): Payer: Medicare Other

## 2020-02-11 ENCOUNTER — Emergency Department (HOSPITAL_COMMUNITY)
Admission: EM | Admit: 2020-02-11 | Discharge: 2020-02-11 | Disposition: A | Discharge status: Home / Self Care | Payer: Medicare Other | Attending: Emergency Medicine | Admitting: Emergency Medicine

## 2020-02-11 DIAGNOSIS — E7849 Other hyperlipidemia: Secondary | ICD-10-CM | POA: Diagnosis not present

## 2020-02-11 DIAGNOSIS — M545 Low back pain, unspecified: Secondary | ICD-10-CM | POA: Diagnosis not present

## 2020-02-11 DIAGNOSIS — S46911A Strain of unspecified muscle, fascia and tendon at shoulder and upper arm level, right arm, initial encounter: Secondary | ICD-10-CM | POA: Insufficient documentation

## 2020-02-11 DIAGNOSIS — E237 Disorder of pituitary gland, unspecified: Secondary | ICD-10-CM | POA: Diagnosis not present

## 2020-02-11 DIAGNOSIS — I10 Essential (primary) hypertension: Secondary | ICD-10-CM | POA: Diagnosis not present

## 2020-02-11 DIAGNOSIS — S060X0A Concussion without loss of consciousness, initial encounter: Secondary | ICD-10-CM | POA: Insufficient documentation

## 2020-02-11 DIAGNOSIS — E236 Other disorders of pituitary gland: Secondary | ICD-10-CM

## 2020-02-11 DIAGNOSIS — S0990XA Unspecified injury of head, initial encounter: Secondary | ICD-10-CM

## 2020-02-11 DIAGNOSIS — E119 Type 2 diabetes mellitus without complications: Secondary | ICD-10-CM | POA: Insufficient documentation

## 2020-02-11 DIAGNOSIS — W19XXXA Unspecified fall, initial encounter: Secondary | ICD-10-CM | POA: Diagnosis not present

## 2020-02-11 DIAGNOSIS — Z79899 Other long term (current) drug therapy: Secondary | ICD-10-CM | POA: Insufficient documentation

## 2020-02-11 DIAGNOSIS — S32040A Wedge compression fracture of fourth lumbar vertebra, initial encounter for closed fracture: Secondary | ICD-10-CM | POA: Insufficient documentation

## 2020-02-11 DIAGNOSIS — Z8551 Personal history of malignant neoplasm of bladder: Secondary | ICD-10-CM | POA: Insufficient documentation

## 2020-02-11 DIAGNOSIS — M25511 Pain in right shoulder: Secondary | ICD-10-CM | POA: Diagnosis not present

## 2020-02-11 DIAGNOSIS — S161XXA Strain of muscle, fascia and tendon at neck level, initial encounter: Secondary | ICD-10-CM | POA: Diagnosis not present

## 2020-02-11 DIAGNOSIS — S199XXA Unspecified injury of neck, initial encounter: Secondary | ICD-10-CM | POA: Diagnosis not present

## 2020-02-11 DIAGNOSIS — S46011A Strain of muscle(s) and tendon(s) of the rotator cuff of right shoulder, initial encounter: Secondary | ICD-10-CM | POA: Diagnosis not present

## 2020-02-11 MED ORDER — HYDROCODONE-ACETAMINOPHEN 5-325 MG PO TABS: 1.0000 | ORAL_TABLET | Freq: Four times a day (QID) | ORAL | 0 refills | Status: DC | PRN

## 2020-02-11 NOTE — ED Triage Notes (Signed)
Pt fell yesterday in the woods, hitting the back of her head. Family states pt c/o of a headache last night. Woke up this morning with left sided back pain, facial numbness and lip numbness and nausea.

## 2020-02-11 NOTE — Discharge Instructions (Signed)
Take the hydrocodone as directed and as needed for the lumbar compression fracture.  You are so showing symptoms of concussion.  Brain rest is recommended for the next 7 to 10 days.  Make an appointment follow-up with your primary care doctor for follow-up with a concussion.  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  CT head CT neck otherwise without any significant findings.  X-ray of the shoulder without significant findings.  Avoid high places avoid driving for the next 7 days.

## 2020-02-11 NOTE — ED Provider Notes (Signed)
[facility] EMERGENCY DEPARTMENT Provider Note   CSN: [id number] Arrival date & time: 02/11/20  1007     History Chief Complaint  Patient presents with  . Fall    Eric [name] Eric [name] is a 74 y.o. male.  Patient fell while in the woods while helping to harvest a deer at around 6 PM.  He fell backwards hit the back of his head on a rock.  Also complaint of some upper lumbar pain.  More to the right side.  Little bit of right shoulder pain.  No lower extremity pain.  At the time of the fall the shoulder was not bothering him.  What is bothering him today.  This morning when he awoke he has had little bit of visual changes little bit of headache and some nausea.  Able to ambulate fine.  No incontinence.  Patient is not on blood thinners.  Patient did not pass out.  Patient tripped and fell.  He lost his balance        Past Medical History:  Diagnosis Date  . Bladder cancer ([place]) 06/29/2016   2013  . Diabetes ([place]) 06/29/2016  . Essential hypertension, benign 06/29/2016  . High cholesterol 06/29/2016  . History of kidney stones     Patient Active Problem List   Diagnosis Date Noted  . Essential hypertension, benign 06/29/2016  . High cholesterol 06/29/2016  . Bladder cancer ([place]) 06/29/2016  . Diabetes ([place]) 06/29/2016  . Guaiac positive stools 06/29/2016    Past Surgical History:  Procedure Laterality Date  . BIOPSY  09/29/2016   Procedure: BIOPSY;  Surgeon: Rogene Houston, MD;  Location: [facility]O [facility];  Service: Endoscopy;;  colon  . BIOPSY  11/14/2019   Procedure: BIOPSY;  Surgeon: Rogene Houston, MD;  Location: [facility];  Service: Endoscopy;;  . BLADDER SURGERY     2013  . CHOLECYSTECTOMY    . COLONOSCOPY N/A 09/29/2016   Procedure: COLONOSCOPY;  Surgeon: Rogene Houston, MD;  Location: [facility];  Service: Endoscopy;  Laterality: N/A;  12:00  . COLONOSCOPY N/A 11/14/2019   Procedure: COLONOSCOPY;  Surgeon: Rogene Houston, MD;  Location: [facility];   Service: Endoscopy;  Laterality: N/A;  125, per office, pt knows new arrival time  . ESOPHAGOGASTRODUODENOSCOPY N/A 11/14/2019   Procedure: ESOPHAGOGASTRODUODENOSCOPY (EGD);  Surgeon: Rogene Houston, MD;  Location: [facility];  Service: Endoscopy;  Laterality: N/A;  . knee athroscopy         Family History  Problem Relation Age of Onset  . Colon cancer Neg Hx     Social History   Tobacco Use  . Smoking status: Never Smoker  . Smokeless tobacco: Never Used  Vaping Use  . Vaping Use: Never used  Substance Use Topics  . Alcohol use: Yes    Comment: occasonal beer  . Drug use: No    Home Medications Prior to Admission medications   Medication Sig Start Date End Date Taking? Authorizing Provider  ALPRAZolam Duanne Moron) 0.5 MG tablet Take 0.5 mg by mouth at bedtime as needed for sleep. 10/15/19   [provider]  amLODipine (NORVASC) 5 MG tablet Take 5 mg by mouth daily.    [provider]  B Complex-C (B-COMPLEX WITH VITAMIN C) tablet Take 1 tablet by mouth daily.    [provider]  Cholecalciferol (VITAMIN D3) 50 MCG (2000 UT) TABS Take 2,000 Units by mouth daily.    [provider]  Cyanocobalamin (VITAMIN  B-12) 5000 MCG SUBL Take 5,000 mcg by mouth daily.    [provider]  dutasteride (AVODART) 0.5 MG capsule Take 0.5 mg by mouth daily.    [provider]  ELDERBERRY PO Take 1 tablet by mouth daily. Gummies    [provider]  Flaxseed, Linseed, (FLAXSEED OIL PO) Take 1 capsule by mouth in the morning and at bedtime.    [provider]  gabapentin (NEURONTIN) 300 MG capsule Take 300 mg by mouth in the morning, at noon, and at bedtime.  05/09/19   [provider]  HYDROcodone-acetaminophen (NORCO/VICODIN) 5-325 MG tablet Take 1 tablet by mouth every 6 (six) hours as needed for moderate pain. 02/11/20   Fredia Sorrow, MD  lisinopril (PRINIVIL,ZESTRIL) 20 MG tablet Take 20 mg by mouth daily.     [provider]  Magnesium 250 MG TABS Take 250 mg by mouth in the morning and at bedtime.    [provider]  Misc Natural Products (BLOOD SUGAR BALANCE) TABS Take 1 tablet by mouth in the morning and at bedtime. Glucocil    [provider]  Multiple Vitamin (MULTIVITAMIN WITH MINERALS) TABS tablet Take 1 tablet by mouth in the morning and at bedtime.    [provider]  Potassium 99 MG TABS Take 99 mg by mouth daily.    [provider]  tamsulosin (FLOMAX) 0.4 MG CAPS capsule Take 0.4 mg by mouth daily.  08/08/19   [provider]  temazepam (RESTORIL) 15 MG capsule Take 15 mg by mouth at bedtime as needed for sleep. 07/25/19   [provider]  Zinc 50 MG TABS Take 50 mg by mouth in the morning and at bedtime.    [provider]    Allergies    Patient has no known allergies.  Review of Systems   Review of Systems  Constitutional: Negative for chills and fever.  HENT: Negative for congestion, rhinorrhea and sore throat.   Eyes: Positive for visual disturbance.  Respiratory: Negative for cough and shortness of breath.   Cardiovascular: Negative for chest pain and leg swelling.  Gastrointestinal: Positive for nausea. Negative for abdominal pain, diarrhea and vomiting.  Genitourinary: Negative for dysuria.  Musculoskeletal: Positive for back pain and neck pain.  Skin: Negative for rash.  Neurological: Positive for headaches. Negative for dizziness and light-headedness.  Hematological: Does not bruise/bleed easily.  Psychiatric/Behavioral: Negative for confusion.    Physical Exam Updated Vital Signs BP (!) 143/66   Pulse 69   Temp 98.4 F (36.9 C) (Oral)   Resp 18   Ht 1.778 m (5\' 10" )   Wt 81.6 kg   SpO2 99%   BMI 25.83 kg/m   Physical Exam Vitals and nursing note reviewed.  Constitutional:      General: He is not in acute distress.    Appearance: Normal appearance. He is well-developed.  HENT:      Head: Normocephalic and atraumatic.  Eyes:     Extraocular Movements: Extraocular movements intact.     Conjunctiva/sclera: Conjunctivae normal.     Pupils: Pupils are equal, round, and reactive to light.  Neck:     Comments: Mild discomfort to palpation to posterior cervical spine with good range of motion Cardiovascular:     Rate and Rhythm: Normal rate and regular rhythm.     Heart sounds: No murmur heard.   Pulmonary:     Effort: Pulmonary effort is normal. No respiratory distress.     Breath sounds: Normal  breath sounds.  Abdominal:     Palpations: Abdomen is soft.     Tenderness: There is no abdominal tenderness.  Musculoskeletal:        General: Normal range of motion.     Cervical back: Normal range of motion and neck supple.     Comments: Mild discomfort with range of motion of the right shoulder no obvious deformity.  Radial pulse distally is intact.  Some tenderness to palpation lumbar vertebrae more to the right.  Skin:    General: Skin is warm and dry.     Capillary Refill: Capillary refill takes less than 2 seconds.  Neurological:     General: No focal deficit present.     Mental Status: He is alert and oriented to person, place, and time.     Cranial Nerves: No cranial nerve deficit.     Sensory: No sensory deficit.     Motor: No weakness.     ED Results / Procedures / Treatments   Labs (all labs ordered are listed, but only abnormal results are displayed) Labs Reviewed - No data to display  EKG None  Radiology DG Shoulder Right  Result Date: 02/11/2020 CLINICAL DATA:  Right shoulder pain since fall yesterday. EXAM: RIGHT SHOULDER - 2+ VIEW COMPARISON:  None. FINDINGS: No acute fracture or dislocation. Mild acromioclavicular and glenohumeral degenerative changes. Bone mineralization is normal. Soft tissues are unremarkable. IMPRESSION: 1. No acute osseous abnormality. 2. Mild degenerative changes. Electronically Signed   By: Titus Dubin M.D.   On:  02/11/2020 12:40   CT Head Wo Contrast  Addendum Date: 02/11/2020   ADDENDUM REPORT: 02/11/2020 14:10 ADDENDUM: Findings discussed with Dr. Rogene Houston at 2:10 PM via telephone. Electronically Signed   By: Margaretha Sheffield MD   On: 02/11/2020 14:10   Result Date: 02/11/2020 CLINICAL DATA:  Poly trauma. EXAM: CT HEAD WITHOUT CONTRAST CT CERVICAL SPINE WITHOUT CONTRAST TECHNIQUE: Multidetector CT imaging of the head and cervical spine was performed following the standard protocol without intravenous contrast. Multiplanar CT image reconstructions of the cervical spine were also generated. COMPARISON:  None. FINDINGS: CT HEAD FINDINGS Brain: No evidence of acute infarction, hemorrhage, hydrocephalus, extra-axial collection or mass lesion/mass effect. Generalized cerebral volume loss with prominent bifrontal extra-axial spaces. There is a large pituitary mass, which is suboptimally evaluated but measures up to approximately 2.0 x 1.9 x 2.4 cm (AP by transverse by cranial caudal). This mass has substantial suprasellar extension and appears to contact and possibly compress the optic chiasm. Vascular: Calcific atherosclerosis. Skull: No acute fracture. Sinuses/Orbits: No acute findings Other: No mastoid effusions CT CERVICAL SPINE FINDINGS Alignment: Straightening of the normal cervical lordosis. No substantial subluxation. Skull base and vertebrae: No evidence of acute fracture. Vertebral body heights are maintained. Osteopenia. Soft tissues and spinal canal: No prevertebral fluid or swelling. No visible canal hematoma. Disc levels: Moderate to severe degenerative disc disease at C6-C7 with disc height loss, endplate sclerosis, and endplate spurring. Multilevel facet and uncovertebral hypertrophy. Upper chest: Negative. IMPRESSION: 1. No evidence of acute intracranial abnormality. 2. No evidence of acute fracture or traumatic malalignment in the cervical spine. 3. Large pituitary mass with significant suprasellar  extension, as detailed above. 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 Electronically Signed: By: Margaretha Sheffield MD On: 02/11/2020 13:12   CT Cervical Spine Wo Contrast  Addendum Date: 02/11/2020   ADDENDUM REPORT: 02/11/2020 14:10 ADDENDUM: Findings discussed with Dr. Rogene Houston at 2:10 PM via  telephone. Electronically Signed   By: Margaretha Sheffield MD   On: 02/11/2020 14:10   Result Date: 02/11/2020 CLINICAL DATA:  Poly trauma. EXAM: CT HEAD WITHOUT CONTRAST CT CERVICAL SPINE WITHOUT CONTRAST TECHNIQUE: Multidetector CT imaging of the head and cervical spine was performed following the standard protocol without intravenous contrast. Multiplanar CT image reconstructions of the cervical spine were also generated. COMPARISON:  None. FINDINGS: CT HEAD FINDINGS Brain: No evidence of acute infarction, hemorrhage, hydrocephalus, extra-axial collection or mass lesion/mass effect. Generalized cerebral volume loss with prominent bifrontal extra-axial spaces. There is a large pituitary mass, which is suboptimally evaluated but measures up to approximately 2.0 x 1.9 x 2.4 cm (AP by transverse by cranial caudal). This mass has substantial suprasellar extension and appears to contact and possibly compress the optic chiasm. Vascular: Calcific atherosclerosis. Skull: No acute fracture. Sinuses/Orbits: No acute findings Other: No mastoid effusions CT CERVICAL SPINE FINDINGS Alignment: Straightening of the normal cervical lordosis. No substantial subluxation. Skull base and vertebrae: No evidence of acute fracture. Vertebral body heights are maintained. Osteopenia. Soft tissues and spinal canal: No prevertebral fluid or swelling. No visible canal hematoma. Disc levels: Moderate to severe degenerative disc disease at C6-C7 with disc height loss, endplate sclerosis, and endplate spurring. Multilevel facet and uncovertebral hypertrophy. Upper  chest: Negative. IMPRESSION: 1. No evidence of acute intracranial abnormality. 2. No evidence of acute fracture or traumatic malalignment in the cervical spine. 3. Large pituitary mass with significant suprasellar extension, as detailed above. 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 Electronically Signed: By: Margaretha Sheffield MD On: 02/11/2020 13:12   CT Lumbar Spine Wo Contrast  Result Date: 02/11/2020 CLINICAL DATA:  Low back pain since a fall yesterday. Initial encounter. EXAM: CT LUMBAR SPINE WITHOUT CONTRAST TECHNIQUE: Multidetector CT imaging of the lumbar spine was performed without intravenous contrast administration. Multiplanar CT image reconstructions were also generated. COMPARISON:  None. FINDINGS: Segmentation: Standard. Alignment: No listhesis. There is convex left curvature with the apex at L2-3. Vertebrae: The patient has a small biconcave compression fracture of L4 with vertebral body height loss centrally of approximately 60%. There is bony retropulsion off the superior endplate as described below. No involvement of the posterior elements is identified. Paraspinal and other soft tissues: Aortic atherosclerosis is seen. Disc levels: T12-L1, L1-2 and L2-3: There is mild appearing degenerative disc disease and mild-to-moderate facet arthropathy. No stenosis. L3-4: There is bony retropulsion centrally and to the right extending into the right foramen. Right much worse than left facet degenerative disease is present. Retropulsed bone causes mild central canal stenosis. There is moderately severe narrowing in the right subarticular recess and foramen due to spondylosis and retropulsed bone. L4-5: Left worse than right facet degenerative disease and a minimal disc bulge without stenosis. L5-S1: Left much worse than right facet degenerative change. Moderate left foraminal narrowing. The central canal and right foramen  are open. IMPRESSION: The examination is positive for an acute biconcave compression fracture of L4 with vertebral body height loss centrally of up to 60%. Spondylosis and retropulsed bone centrally and to the right causes narrowing in the right subarticular recess and foramen which could impact the exiting right L3 and descending right L4 roots. There is mild central canal stenosis overall at L3-4. Aortic Atherosclerosis (ICD10-I70.0). Electronically Signed   By: Inge Rise M.D.   On: 02/11/2020 13:13    Procedures Procedures (including critical care time)  Medications Ordered in ED Medications - No  data to display  ED Course  I have reviewed the triage vital signs and the nursing notes.  Pertinent labs & imaging results that were available during my care of the patient were reviewed by me and considered in my medical decision making (see chart for details).    MDM Rules/Calculators/A&P                         Patient's work-up symptoms consistent with a concussion.  CT head and neck without any acute findings.  X-ray of the right shoulder without any acute findings.  CT head though did have an incidental finding of a large pituitary mass.  Which will need outpatient follow-up.  Based on his symptoms as stated above he does have symptoms of concussion.  CT lumbar shows L4 compression fracture.  Could be pinching some on the L3 nerve root.  The patient without any lower extremity abnormalities.  Pression fracture stable.  Patient was treated with hydrocodone for that.  Follow-up with primary care doctor for the pituitary adenoma the concussion.  And the compression fracture.     Final Clinical Impression(s) / ED Diagnoses Final diagnoses:  Fall, initial encounter  Injury of head, initial encounter  Cervical strain, acute, initial encounter  Shoulder strain, right, initial encounter  Concussion without loss of consciousness, initial encounter  Pituitary mass ([place])  Compression  fracture of L4 lumbar vertebra, closed, initial encounter ([place])    Rx / DC Orders ED Discharge Orders         Ordered    HYDROcodone-acetaminophen (NORCO/VICODIN) 5-325 MG tablet  Every 6 hours PRN        02/11/20 1452           Fredia Sorrow, MD 02/11/20 1458

## 2020-02-14 DIAGNOSIS — M79672 Pain in left foot: Secondary | ICD-10-CM | POA: Diagnosis not present

## 2020-02-14 DIAGNOSIS — E1142 Type 2 diabetes mellitus with diabetic polyneuropathy: Secondary | ICD-10-CM | POA: Diagnosis not present

## 2020-02-14 DIAGNOSIS — M25572 Pain in left ankle and joints of left foot: Secondary | ICD-10-CM | POA: Diagnosis not present

## 2020-02-18 DIAGNOSIS — Z299 Encounter for prophylactic measures, unspecified: Secondary | ICD-10-CM | POA: Diagnosis not present

## 2020-02-18 DIAGNOSIS — S32040A Wedge compression fracture of fourth lumbar vertebra, initial encounter for closed fracture: Secondary | ICD-10-CM | POA: Diagnosis not present

## 2020-02-18 DIAGNOSIS — S060X9A Concussion with loss of consciousness of unspecified duration, initial encounter: Secondary | ICD-10-CM | POA: Diagnosis not present

## 2020-02-18 DIAGNOSIS — W19XXXA Unspecified fall, initial encounter: Secondary | ICD-10-CM | POA: Diagnosis not present

## 2020-02-18 DIAGNOSIS — E236 Other disorders of pituitary gland: Secondary | ICD-10-CM | POA: Diagnosis not present

## 2020-02-18 DIAGNOSIS — Z2821 Immunization not carried out because of patient refusal: Secondary | ICD-10-CM | POA: Diagnosis not present

## 2020-02-18 DIAGNOSIS — I10 Essential (primary) hypertension: Secondary | ICD-10-CM | POA: Diagnosis not present

## 2020-02-21 DIAGNOSIS — D352 Benign neoplasm of pituitary gland: Secondary | ICD-10-CM | POA: Diagnosis not present

## 2020-02-21 DIAGNOSIS — E236 Other disorders of pituitary gland: Secondary | ICD-10-CM | POA: Diagnosis not present

## 2020-02-21 DIAGNOSIS — D181 Lymphangioma, any site: Secondary | ICD-10-CM | POA: Diagnosis not present

## 2020-02-21 DIAGNOSIS — G9389 Other specified disorders of brain: Secondary | ICD-10-CM | POA: Diagnosis not present

## 2020-02-21 DIAGNOSIS — E237 Disorder of pituitary gland, unspecified: Secondary | ICD-10-CM | POA: Diagnosis not present

## 2020-02-25 DIAGNOSIS — R195 Other fecal abnormalities: Secondary | ICD-10-CM | POA: Diagnosis not present

## 2020-02-25 LAB — CBC
HCT: 40.7 % (ref 38.5–50.0)
Hemoglobin: 13.8 g/dL (ref 13.2–17.1)
MCH: 31.3 pg (ref 27.0–33.0)
MCHC: 33.9 g/dL (ref 32.0–36.0)
MCV: 92.3 fL (ref 80.0–100.0)
MPV: 10 fL (ref 7.5–12.5)
Platelets: 190 10*3/uL (ref 140–400)
RBC: 4.41 10*6/uL (ref 4.20–5.80)
RDW: 13.4 % (ref 11.0–15.0)
WBC: 6 10*3/uL (ref 3.8–10.8)

## 2020-02-27 DIAGNOSIS — E1165 Type 2 diabetes mellitus with hyperglycemia: Secondary | ICD-10-CM | POA: Diagnosis not present

## 2020-02-27 DIAGNOSIS — Z299 Encounter for prophylactic measures, unspecified: Secondary | ICD-10-CM | POA: Diagnosis not present

## 2020-02-27 DIAGNOSIS — R519 Headache, unspecified: Secondary | ICD-10-CM | POA: Diagnosis not present

## 2020-02-27 DIAGNOSIS — E119 Type 2 diabetes mellitus without complications: Secondary | ICD-10-CM | POA: Diagnosis not present

## 2020-02-27 DIAGNOSIS — I10 Essential (primary) hypertension: Secondary | ICD-10-CM | POA: Diagnosis not present

## 2020-02-27 DIAGNOSIS — E236 Other disorders of pituitary gland: Secondary | ICD-10-CM | POA: Diagnosis not present

## 2020-02-28 ENCOUNTER — Telehealth (INDEPENDENT_AMBULATORY_CARE_PROVIDER_SITE_OTHER): Payer: Self-pay | Admitting: Internal Medicine

## 2020-02-28 NOTE — Telephone Encounter (Signed)
Patient CBC is normal Patient was not available.  Results discussed with his wife. He told me that he has noticed some rectal bleeding daily. Therefore will arrange for office visit in first or second week of January. If bleeding is significant he will call office and we will try to bring him earlier.

## 2020-03-02 DIAGNOSIS — Z6826 Body mass index (BMI) 26.0-26.9, adult: Secondary | ICD-10-CM | POA: Diagnosis not present

## 2020-03-02 DIAGNOSIS — D352 Benign neoplasm of pituitary gland: Secondary | ICD-10-CM | POA: Diagnosis not present

## 2020-03-02 DIAGNOSIS — I10 Essential (primary) hypertension: Secondary | ICD-10-CM | POA: Diagnosis not present

## 2020-03-03 DIAGNOSIS — D352 Benign neoplasm of pituitary gland: Secondary | ICD-10-CM | POA: Diagnosis not present

## 2020-03-03 DIAGNOSIS — I10 Essential (primary) hypertension: Secondary | ICD-10-CM | POA: Diagnosis not present

## 2020-03-03 DIAGNOSIS — Z6826 Body mass index (BMI) 26.0-26.9, adult: Secondary | ICD-10-CM | POA: Diagnosis not present

## 2020-03-03 DIAGNOSIS — Z299 Encounter for prophylactic measures, unspecified: Secondary | ICD-10-CM | POA: Diagnosis not present

## 2020-03-03 DIAGNOSIS — E1165 Type 2 diabetes mellitus with hyperglycemia: Secondary | ICD-10-CM | POA: Diagnosis not present

## 2020-03-24 ENCOUNTER — Other Ambulatory Visit: Payer: Self-pay | Admitting: Neurosurgery

## 2020-03-24 DIAGNOSIS — D352 Benign neoplasm of pituitary gland: Secondary | ICD-10-CM | POA: Diagnosis not present

## 2020-03-24 DIAGNOSIS — Z299 Encounter for prophylactic measures, unspecified: Secondary | ICD-10-CM | POA: Diagnosis not present

## 2020-03-24 DIAGNOSIS — E1165 Type 2 diabetes mellitus with hyperglycemia: Secondary | ICD-10-CM | POA: Diagnosis not present

## 2020-03-24 DIAGNOSIS — I10 Essential (primary) hypertension: Secondary | ICD-10-CM | POA: Diagnosis not present

## 2020-03-24 DIAGNOSIS — C679 Malignant neoplasm of bladder, unspecified: Secondary | ICD-10-CM | POA: Diagnosis not present

## 2020-03-24 DIAGNOSIS — E236 Other disorders of pituitary gland: Secondary | ICD-10-CM | POA: Diagnosis not present

## 2020-03-26 DIAGNOSIS — H524 Presbyopia: Secondary | ICD-10-CM | POA: Diagnosis not present

## 2020-03-26 DIAGNOSIS — H52223 Regular astigmatism, bilateral: Secondary | ICD-10-CM | POA: Diagnosis not present

## 2020-03-26 DIAGNOSIS — E119 Type 2 diabetes mellitus without complications: Secondary | ICD-10-CM | POA: Diagnosis not present

## 2020-03-26 DIAGNOSIS — H5203 Hypermetropia, bilateral: Secondary | ICD-10-CM | POA: Diagnosis not present

## 2020-03-26 DIAGNOSIS — H43813 Vitreous degeneration, bilateral: Secondary | ICD-10-CM | POA: Diagnosis not present

## 2020-03-26 DIAGNOSIS — H31113 Age-related choroidal atrophy, bilateral: Secondary | ICD-10-CM | POA: Diagnosis not present

## 2020-03-26 DIAGNOSIS — H2513 Age-related nuclear cataract, bilateral: Secondary | ICD-10-CM | POA: Diagnosis not present

## 2020-04-14 DIAGNOSIS — E236 Other disorders of pituitary gland: Secondary | ICD-10-CM

## 2020-04-14 HISTORY — DX: Other disorders of pituitary gland: E23.6

## 2020-04-20 ENCOUNTER — Telehealth (INDEPENDENT_AMBULATORY_CARE_PROVIDER_SITE_OTHER): Payer: Medicare Other | Admitting: Gastroenterology

## 2020-04-20 ENCOUNTER — Other Ambulatory Visit: Payer: Self-pay

## 2020-04-20 ENCOUNTER — Encounter (INDEPENDENT_AMBULATORY_CARE_PROVIDER_SITE_OTHER): Payer: Self-pay | Admitting: Gastroenterology

## 2020-04-20 ENCOUNTER — Ambulatory Visit (INDEPENDENT_AMBULATORY_CARE_PROVIDER_SITE_OTHER): Payer: Medicare Other | Admitting: Gastroenterology

## 2020-04-20 DIAGNOSIS — K649 Unspecified hemorrhoids: Secondary | ICD-10-CM | POA: Diagnosis not present

## 2020-04-20 DIAGNOSIS — K5900 Constipation, unspecified: Secondary | ICD-10-CM | POA: Diagnosis not present

## 2020-04-20 NOTE — Patient Instructions (Signed)
-   Take Miralax one capful as needed if constipated - Call back if presence of recurrent bleeding

## 2020-04-20 NOTE — Progress Notes (Signed)
Surgical Instructions    Your procedure is scheduled on February 11  Report to [facility] Main Entrance "A" at 0530 A.M., then check in with the Admitting office.  Call this number if you have problems the morning of surgery:  [phone]   If you have any questions prior to your surgery date call [phone]: Open Monday-Friday 8am-4pm    Remember:  Do not eat or drink after midnight the night before your surgery    Take these medicines the morning of surgery with A SIP OF WATER  amLODipine (NORVASC)  dutasteride (AVODART)  gabapentin (NEURONTIN) tamsulosin (FLOMAX)  As of today, STOP taking any Aspirin (unless otherwise instructed by your surgeon) Aleve, Naproxen, Ibuprofen, Motrin, Advil, Goody's, BC's, all herbal medications, fish oil, and all vitamins.                     Do not wear jewelry            Do not wear lotions, powders, colognes, or deodorant.           Men may shave face and neck.            Do not bring valuables to the hospital.            [facility] is not responsible for any belongings or valuables.  Do NOT Smoke (Tobacco/Vaping) or drink Alcohol 24 hours prior to your procedure If you use a CPAP at night, you may bring all equipment for your overnight stay.   Contacts, glasses, dentures or bridgework may not be worn into surgery, please bring cases for these belongings   For patients admitted to the hospital, discharge time will be determined by your treatment team.   Patients discharged the day of surgery will not be allowed to drive home, and someone needs to stay with them for 24 hours.    Special instructions:   [place]- Preparing For Surgery  Before surgery, you can play an important role. Because skin is not sterile, your skin needs to be as free of germs as possible. You can reduce the number of germs on your skin by washing with CHG (chlorahexidine gluconate) Soap before surgery.  CHG is an antiseptic cleaner which kills germs and bonds  with the skin to continue killing germs even after washing.    Oral Hygiene is also important to reduce your risk of infection.  Remember - BRUSH YOUR TEETH THE MORNING OF SURGERY WITH YOUR REGULAR TOOTHPASTE  Please do not use if you have an allergy to CHG or antibacterial soaps. If your skin becomes reddened/irritated stop using the CHG.  Do not shave (including legs and underarms) for at least 48 hours prior to first CHG shower. It is OK to shave your face.  Please follow these instructions carefully.   1. If you chose to wash your hair, wash your hair first as usual with your normal shampoo.  2. After you shampoo, rinse your hair and body thoroughly to remove the shampoo.  3. Wash Face and genitals (private parts) with your normal soap.   4. THEN Shower the NIGHT BEFORE SURGERY and the MORNING OF SURGERY with CHG Soap.   5. Use CHG as you would any other liquid soap. You can apply CHG directly to the skin and wash gently with a scrungie or a clean washcloth.   6. Apply the CHG Soap to your body ONLY FROM THE NECK DOWN.  Do not use on open wounds or  open sores. Avoid contact with your eyes, ears, mouth and genitals (private parts). Wash Face and genitals (private parts)  with your normal soap.   7. Wash thoroughly, paying special attention to the area where your surgery will be performed.  8. Thoroughly rinse your body with warm water from the neck down.  9. DO NOT shower/wash with your normal soap after using and rinsing off the CHG Soap.  10. Pat yourself dry with a CLEAN TOWEL.  11. Wear CLEAN PAJAMAS to bed the night before surgery  12. Place CLEAN SHEETS on your bed the night before your surgery  13. DO NOT SLEEP WITH PETS.   Day of Surgery: Wear Clean/Comfortable clothing the morning of surgery Do not apply any deodorants/lotions.   Remember to brush your teeth WITH YOUR REGULAR TOOTHPASTE.   Please read over the following fact sheets that you were given.

## 2020-04-20 NOTE — Progress Notes (Signed)
Maylon Peppers, M.D. Gastroenterology & Hepatology [facility] [address] [place], [place] [address] Primary Care Physician: Arsenio Katz, NP [place] [address]  This is a telephone virtual visit.  It required patient-provider interaction for the medical decision making as documented below. The patient has consented and agreed to proceed with a Telehealth encounter given the current Coronavirus pandemic.  VIRTUAL VISIT NOTE Patient location: Home Provider location: Office  I will communicate my assessment and recommendations to the referring MD via EMR.  Problems: 1.  Rectal bleeding -resolved 2.  History of hemorrhoids  History of Present Illness: Genie Mirabal is a 75 y.o. male with past medical history of diabetes, bladder cancer, hypertension, high cholesterol and pituitary adenoma, who presents for evaluation of rectal bleeding.  Patient was last seen in clinic on 09/30/2019.  At that time he was being seen for positive Hemoccult.  He was scheduled to undergo EGD and colonoscopy due to these.  Colonoscopy was performed on September 2021 showed diverticulosis in the sigmoid colon, descending colon, transverse colon and hepatic flexure, there was presence of diverticulitis in the sigmoid, internal hemorrhoids.  He was prescribed ciprofloxacin and metronidazole.EGD performed on the same day showed normal esophagus, mild inflammation in the gastric antrum, normal duodenum.  Biopsies from the stomach were negative for H. pylori, there was presence of duodenitis upon histologic evaluation of the duodenal mucosa but no presence of celiac disease.  Patient called early December 2021 as he had presented some episodes of rectal bleeding.  These episodes were occasional and self-limited.  He has not presented any more bleeding since then and states that he has been asymptomatic.  States that the episodes of rectal  bleeding happened when he was constipated.  For this he uses milk of magnesia as needed but has not needed to use any medication for several months.  Denies have any complaints.  He is scheduled to undergo transsphenoidal resection of a pituitary tumor on 04/24/2020. The patient denies having any nausea, vomiting, fever, chills, hematochezia, melena, hematemesis, abdominal distention, abdominal pain, diarrhea, jaundice, pruritus or weight loss.  Past Medical History: Past Medical History:  Diagnosis Date  . Bladder cancer ([place]) 06/29/2016   2013  . Diabetes ([place]) 06/29/2016  . Essential hypertension, benign 06/29/2016  . High cholesterol 06/29/2016  . History of kidney stones     Past Surgical History: Past Surgical History:  Procedure Laterality Date  . BIOPSY  09/29/2016   Procedure: BIOPSY;  Surgeon: Rogene Houston, MD;  [facility];  Service: Endoscopy;;  colon  . BIOPSY  11/14/2019   Procedure: BIOPSY;  Surgeon: Rogene Houston, MD;  [facility];  Service: Endoscopy;;  . BLADDER SURGERY     2013  . CHOLECYSTECTOMY    . COLONOSCOPY N/A 09/29/2016   Procedure: COLONOSCOPY;  Surgeon: Rogene Houston, MD;  [facility];  Service: Endoscopy;  Laterality: N/A;  12:00  . COLONOSCOPY N/A 11/14/2019   Procedure: COLONOSCOPY;  Surgeon: Rogene Houston, MD;  [facility];  Service: Endoscopy;  Laterality: N/A;  125, per office, pt knows new arrival time  . ESOPHAGOGASTRODUODENOSCOPY N/A 11/14/2019   Procedure: ESOPHAGOGASTRODUODENOSCOPY (EGD);  Surgeon: Rogene Houston, MD;  [facility]: [facility];  Service: Endoscopy;  Laterality: N/A;  . knee athroscopy      Family History: Family History  Problem Relation Age of Onset  . Colon cancer Neg Hx  Social History: Social History   Tobacco Use  Smoking Status Never Smoker  Smokeless Tobacco Never Used   Social History   Substance and Sexual Activity  Alcohol Use Not Currently   Social  History   Substance and Sexual Activity  Drug Use No    Allergies: No Known Allergies  Medications: Current Outpatient Medications  Medication Sig Dispense Refill  . amLODipine (NORVASC) 5 MG tablet Take 5 mg by mouth daily.    . Ascorbic Acid (VITAMIN C) 1000 MG tablet Take 1,000 mg by mouth in the morning and at bedtime.    . B Complex-C (B-COMPLEX WITH VITAMIN C) tablet Take 1 tablet by mouth daily.    . Cholecalciferol (VITAMIN D3) 50 MCG (2000 UT) TABS Take 2,000 Units by mouth daily.    . Coenzyme Q10 (COQ10) 100 MG CAPS Take 100 mg by mouth daily.    Marland Kitchen dutasteride (AVODART) 0.5 MG capsule Take 0.5 mg by mouth daily.    Marland Kitchen gabapentin (NEURONTIN) 300 MG capsule Take 300 mg by mouth in the morning, at noon, and at bedtime.     Marland Kitchen lisinopril (PRINIVIL,ZESTRIL) 20 MG tablet Take 20 mg by mouth daily.    . Magnesium 400 MG TABS Take 400 mg by mouth daily.    . Misc Natural Products (BLOOD SUGAR BALANCE) TABS Take 1 tablet by mouth in the morning and at bedtime. Glucocil    . Multiple Vitamin (MULTIVITAMIN WITH MINERALS) TABS tablet Take 1 tablet by mouth daily.    . Potassium 99 MG TABS Take 99 mg by mouth daily.    . psyllium (METAMUCIL) 58.6 % powder Take 1 packet by mouth daily.    . tamsulosin (FLOMAX) 0.4 MG CAPS capsule Take 0.4 mg by mouth daily.     . Zinc 50 MG TABS Take 50 mg by mouth daily.    . temazepam (RESTORIL) 15 MG capsule Take 15 mg by mouth at bedtime as needed for sleep. (Patient not taking: Reported on 04/20/2020)     No current facility-administered medications for this visit.    Review of Systems: GENERAL: negative for malaise, night sweats HEENT: No changes in hearing or vision, no nose bleeds or other nasal problems. NECK: Negative for lumps, goiter, pain and significant neck swelling RESPIRATORY: Negative for cough, wheezing CARDIOVASCULAR: Negative for chest pain, leg swelling, palpitations, orthopnea GI: SEE HPI MUSCULOSKELETAL: Negative for joint pain  or swelling, back pain, and muscle pain. SKIN: Negative for lesions, rash PSYCH: Negative for sleep disturbance, mood disorder and recent psychosocial stressors. HEMATOLOGY Negative for prolonged bleeding, bruising easily, and swollen nodes. ENDOCRINE: Negative for cold or heat intolerance, polyuria, polydipsia and goiter. NEURO: negative for tremor, gait imbalance, syncope and seizures. The remainder of the review of systems is noncontributory.   Physical Exam: No exam was performed as this was a telephone encounter  Imaging/Labs: as above  I personally reviewed and interpreted the available labs, imaging and endoscopic files.  Impression and Plan: Leelynn Whetsel is a 75 y.o. male with past medical history of diabetes, bladder cancer, hypertension, high cholesterol and pituitary adenoma, who presents for evaluation of rectal bleeding.  Patient presented transient episode of rectal bleeding which were self-limited.  He had a recent colonoscopy that showed presence of hemorrhoids and diverticulosis.  As he had presented painless rectal bleeding.  These episodes could be related to either the hemorrhoids or diverticular bleeding but this has resolved.  No further action is warranted at this point 4  days.  He can take MiraLAX as needed if he presents recurrent episodes of constipation to avoid further bleeding.  The patient understood and agreed.  - Take Miralax as needed if constipated - Call back if presence of recurrent bleeding  All questions were answered.      Total visit time: I spent a total of 20 minutes  Maylon Peppers, MD Gastroenterology and Hepatology [facility]

## 2020-04-21 ENCOUNTER — Other Ambulatory Visit (HOSPITAL_COMMUNITY)
Admission: RE | Admit: 2020-04-21 | Discharge: 2020-04-21 | Disposition: A | Discharge status: Home / Self Care | Payer: Medicare Other | Source: Ambulatory Visit | Attending: Neurosurgery | Admitting: Neurosurgery

## 2020-04-21 ENCOUNTER — Encounter (HOSPITAL_COMMUNITY)
Admission: RE | Admit: 2020-04-21 | Discharge: 2020-04-21 | Disposition: A | Discharge status: Home / Self Care | Payer: Medicare Other | Source: Ambulatory Visit | Attending: Neurosurgery | Admitting: Neurosurgery

## 2020-04-21 ENCOUNTER — Encounter (HOSPITAL_COMMUNITY): Payer: Self-pay

## 2020-04-21 DIAGNOSIS — Z8551 Personal history of malignant neoplasm of bladder: Secondary | ICD-10-CM | POA: Diagnosis not present

## 2020-04-21 DIAGNOSIS — R519 Headache, unspecified: Secondary | ICD-10-CM | POA: Diagnosis not present

## 2020-04-21 DIAGNOSIS — E237 Disorder of pituitary gland, unspecified: Secondary | ICD-10-CM | POA: Diagnosis not present

## 2020-04-21 DIAGNOSIS — I10 Essential (primary) hypertension: Secondary | ICD-10-CM | POA: Diagnosis not present

## 2020-04-21 DIAGNOSIS — Z87891 Personal history of nicotine dependence: Secondary | ICD-10-CM | POA: Diagnosis not present

## 2020-04-21 DIAGNOSIS — D352 Benign neoplasm of pituitary gland: Secondary | ICD-10-CM | POA: Diagnosis not present

## 2020-04-21 DIAGNOSIS — D497 Neoplasm of unspecified behavior of endocrine glands and other parts of nervous system: Secondary | ICD-10-CM | POA: Diagnosis not present

## 2020-04-21 DIAGNOSIS — G939 Disorder of brain, unspecified: Secondary | ICD-10-CM | POA: Diagnosis not present

## 2020-04-21 DIAGNOSIS — E7800 Pure hypercholesterolemia, unspecified: Secondary | ICD-10-CM | POA: Diagnosis not present

## 2020-04-21 DIAGNOSIS — G4700 Insomnia, unspecified: Secondary | ICD-10-CM | POA: Diagnosis not present

## 2020-04-21 DIAGNOSIS — Z9049 Acquired absence of other specified parts of digestive tract: Secondary | ICD-10-CM | POA: Diagnosis not present

## 2020-04-21 DIAGNOSIS — E119 Type 2 diabetes mellitus without complications: Secondary | ICD-10-CM | POA: Diagnosis not present

## 2020-04-21 DIAGNOSIS — Z20822 Contact with and (suspected) exposure to covid-19: Secondary | ICD-10-CM | POA: Insufficient documentation

## 2020-04-21 DIAGNOSIS — Z01812 Encounter for preprocedural laboratory examination: Secondary | ICD-10-CM | POA: Insufficient documentation

## 2020-04-21 DIAGNOSIS — Z01818 Encounter for other preprocedural examination: Secondary | ICD-10-CM | POA: Insufficient documentation

## 2020-04-21 DIAGNOSIS — J3489 Other specified disorders of nose and nasal sinuses: Secondary | ICD-10-CM | POA: Diagnosis not present

## 2020-04-21 DIAGNOSIS — N400 Enlarged prostate without lower urinary tract symptoms: Secondary | ICD-10-CM | POA: Diagnosis not present

## 2020-04-21 DIAGNOSIS — Z79899 Other long term (current) drug therapy: Secondary | ICD-10-CM | POA: Diagnosis not present

## 2020-04-21 DIAGNOSIS — J323 Chronic sphenoidal sinusitis: Secondary | ICD-10-CM | POA: Diagnosis not present

## 2020-04-21 LAB — TYPE AND SCREEN
ABO/RH(D): O POS
Antibody Screen: NEGATIVE

## 2020-04-21 LAB — BASIC METABOLIC PANEL
Anion gap: 11 (ref 5–15)
BUN: 16 mg/dL (ref 8–23)
CO2: 21 mmol/L — ABNORMAL LOW (ref 22–32)
Calcium: 9.6 mg/dL (ref 8.9–10.3)
Chloride: 105 mmol/L (ref 98–111)
Creatinine, Ser: 0.89 mg/dL (ref 0.61–1.24)
GFR, Estimated: >60 mL/min (ref >60)
Glucose, Bld: 123 mg/dL — ABNORMAL HIGH (ref 70–99)
Potassium: 4.3 mmol/L (ref 3.5–5.1)
Sodium: 137 mmol/L (ref 135–145)

## 2020-04-21 LAB — SARS CORONAVIRUS 2 (TAT 6-24 HRS): SARS Coronavirus 2: NEGATIVE

## 2020-04-21 LAB — CBC
HCT: 43.5 % (ref 39.0–52.0)
Hemoglobin: 14.1 g/dL (ref 13.0–17.0)
MCH: 30.5 pg (ref 26.0–34.0)
MCHC: 32.4 g/dL (ref 30.0–36.0)
MCV: 94.2 fL (ref 80.0–100.0)
Platelets: 163 10*3/uL (ref 150–400)
RBC: 4.62 MIL/uL (ref 4.22–5.81)
RDW: 13.5 % (ref 11.5–15.5)
WBC: 6.6 10*3/uL (ref 4.0–10.5)
nRBC: 0 % (ref 0.0–0.2)

## 2020-04-21 LAB — GLUCOSE, CAPILLARY: Glucose-Capillary: 111 mg/dL — ABNORMAL HIGH (ref 70–99)

## 2020-04-21 NOTE — Progress Notes (Signed)
PCP - Arsenio Katz, NP Cardiologist - denies  Chest x-ray - N/A EKG - 04/21/20 Stress Test- denies  ECHO - denies Cardiac Cath - denies  Sleep Study - denies CPAP -   Fasting Blood Sugar - 100-120 Checks Blood Sugar QOD   Aspirin Instructions: Patient instructed to hold all Aspirin, NSAID's, herbal medications, fish oil and vitamins as of today   ERAS Protcol - N/A PRE-SURGERY Ensure or G2- N/A  COVID TEST- 04/21/20 at at [place]. Pt instructed to remain in their car. Educated on Transport planner until [employer].    Anesthesia review:   Patient denies shortness of breath, fever, cough and chest pain at PAT appointment   All instructions explained to the patient, with a verbal understanding of the material. Patient agrees to go over the instructions while at home for a better understanding. Patient also instructed to self quarantine after being tested for COVID-19. The opportunity to ask questions was provided.

## 2020-04-22 NOTE — Anesthesia Preprocedure Evaluation (Addendum)
Anesthesia Evaluation  Patient identified by MRN, date of birth, ID band Patient awake    Reviewed: Allergy & Precautions, NPO status , Patient's Chart, lab work & pertinent test results  Airway Mallampati: II  TM Distance: >3 FB Neck ROM: Full    Dental no notable dental hx. (+) Dental Advisory Given, Partial Lower, Partial Upper, Poor Dentition, Chipped   Pulmonary neg pulmonary ROS,    Pulmonary exam normal breath sounds clear to auscultation       Cardiovascular hypertension, Pt. on medications Normal cardiovascular exam Rhythm:Regular Rate:Normal     Neuro/Psych negative neurological ROS     GI/Hepatic negative GI ROS, Neg liver ROS,   Endo/Other  diabetes  Renal/GU negative Renal ROS     Musculoskeletal negative musculoskeletal ROS (+)   Abdominal   Peds  Hematology negative hematology ROS (+)   Anesthesia Other Findings   Reproductive/Obstetrics                           Anesthesia Physical Anesthesia Plan  ASA: III  Anesthesia Plan: General   Post-op Pain Management:    Induction: Intravenous  PONV Risk Score and Plan: 2 and Ondansetron, Aprepitant, Dexamethasone, Diphenhydramine, Treatment may vary due to age or medical condition and Propofol infusion  Airway Management Planned: Oral ETT  Additional Equipment: Arterial line  Intra-op Plan:   Post-operative Plan: Extubation in OR  Informed Consent: I have reviewed the patients History and Physical, chart, labs and discussed the procedure including the risks, benefits and alternatives for the proposed anesthesia with the patient or authorized representative who has indicated his/her understanding and acceptance.     Dental advisory given  Plan Discussed with: CRNA  Anesthesia Plan Comments: (2 x PIV    TTE 06/06/2016 (copy on chart): Left ventricle: The estimated LV ejection fraction is normal at 70-75%.  LV  chamber size is normal.  LV wall thickness is normal.  There is impaired left ventricular relaxation. Right ventricle: There is normal right ventricular size, wall dimension, and systolic function. Left atrium: LA chamber size is normal. Right atrium: RA chamber size is normal. Aortic valve: The aortic valve is structurally normal.  Aortic valve leaflet excursion is normal. Mitral valve: There is trace mitral regurgitation. Tricuspid valve: There is mild tricuspid regurgitation.  The estimated RV systolic pressure is normal. Pulmonary valve: There is mild pulmonic regurgitation. Pericardium: The pericardium is normal. Aorta: The size of the visualized portion of the aortic root is within normal limits. Venous: The IVC is not dilated and collapses >50% with inspiration.)     Anesthesia Quick Evaluation

## 2020-04-24 ENCOUNTER — Inpatient Hospital Stay (HOSPITAL_COMMUNITY)
Admission: RE | Admit: 2020-04-24 | Discharge: 2020-04-26 | DRG: 615 | Disposition: A | Discharge status: Home / Self Care | Payer: Medicare Other | Attending: Neurosurgery | Admitting: Neurosurgery

## 2020-04-24 ENCOUNTER — Other Ambulatory Visit: Payer: Self-pay

## 2020-04-24 ENCOUNTER — Inpatient Hospital Stay (HOSPITAL_COMMUNITY): Payer: Medicare Other

## 2020-04-24 ENCOUNTER — Encounter (HOSPITAL_COMMUNITY): Payer: Self-pay | Admitting: Neurosurgery

## 2020-04-24 ENCOUNTER — Encounter (HOSPITAL_COMMUNITY)
Admission: RE | Disposition: A | Discharge status: Home / Self Care | Payer: Self-pay | Source: Home / Self Care | Attending: Neurosurgery

## 2020-04-24 DIAGNOSIS — Z20822 Contact with and (suspected) exposure to covid-19: Secondary | ICD-10-CM | POA: Diagnosis not present

## 2020-04-24 DIAGNOSIS — Z9049 Acquired absence of other specified parts of digestive tract: Secondary | ICD-10-CM | POA: Diagnosis not present

## 2020-04-24 DIAGNOSIS — G4700 Insomnia, unspecified: Secondary | ICD-10-CM | POA: Diagnosis not present

## 2020-04-24 DIAGNOSIS — Z8551 Personal history of malignant neoplasm of bladder: Secondary | ICD-10-CM | POA: Diagnosis not present

## 2020-04-24 DIAGNOSIS — E119 Type 2 diabetes mellitus without complications: Secondary | ICD-10-CM | POA: Diagnosis not present

## 2020-04-24 DIAGNOSIS — Z87891 Personal history of nicotine dependence: Secondary | ICD-10-CM

## 2020-04-24 DIAGNOSIS — N400 Enlarged prostate without lower urinary tract symptoms: Secondary | ICD-10-CM | POA: Diagnosis present

## 2020-04-24 DIAGNOSIS — I10 Essential (primary) hypertension: Secondary | ICD-10-CM | POA: Diagnosis present

## 2020-04-24 DIAGNOSIS — R519 Headache, unspecified: Secondary | ICD-10-CM | POA: Diagnosis not present

## 2020-04-24 DIAGNOSIS — Z79899 Other long term (current) drug therapy: Secondary | ICD-10-CM | POA: Diagnosis not present

## 2020-04-24 DIAGNOSIS — E7800 Pure hypercholesterolemia, unspecified: Secondary | ICD-10-CM | POA: Diagnosis present

## 2020-04-24 DIAGNOSIS — D352 Benign neoplasm of pituitary gland: Secondary | ICD-10-CM | POA: Diagnosis not present

## 2020-04-24 DIAGNOSIS — D497 Neoplasm of unspecified behavior of endocrine glands and other parts of nervous system: Secondary | ICD-10-CM | POA: Diagnosis present

## 2020-04-24 DIAGNOSIS — E236 Other disorders of pituitary gland: Secondary | ICD-10-CM

## 2020-04-24 HISTORY — PX: TRANSPHENOIDAL APPROACH EXPOSURE: SHX6311

## 2020-04-24 HISTORY — PX: CRANIOTOMY: SHX93

## 2020-04-24 LAB — CBC
HCT: 37.2 % — ABNORMAL LOW (ref 39.0–52.0)
Hemoglobin: 12.4 g/dL — ABNORMAL LOW (ref 13.0–17.0)
MCH: 30.1 pg (ref 26.0–34.0)
MCHC: 33.3 g/dL (ref 30.0–36.0)
MCV: 90.3 fL (ref 80.0–100.0)
Platelets: 160 10*3/uL (ref 150–400)
RBC: 4.12 MIL/uL — ABNORMAL LOW (ref 4.22–5.81)
RDW: 13.3 % (ref 11.5–15.5)
WBC: 8.6 10*3/uL (ref 4.0–10.5)
nRBC: 0 % (ref 0.0–0.2)

## 2020-04-24 LAB — GLUCOSE, CAPILLARY
Glucose-Capillary: 134 mg/dL — ABNORMAL HIGH (ref 70–99)
Glucose-Capillary: 217 mg/dL — ABNORMAL HIGH (ref 70–99)
Glucose-Capillary: 207 mg/dL — ABNORMAL HIGH (ref 70–99)

## 2020-04-24 LAB — CREATININE, SERUM
Creatinine, Ser: 0.95 mg/dL (ref 0.61–1.24)
GFR, Estimated: >60 mL/min (ref >60)

## 2020-04-24 LAB — ABO/RH: ABO/RH(D): O POS

## 2020-04-24 SURGERY — CRANIOTOMY HYPOPHYSECTOMY TRANSNASAL APPROACH
Anesthesia: General

## 2020-04-24 MED ORDER — CHLORHEXIDINE GLUCONATE CLOTH 2 % EX PADS
6.0000 | MEDICATED_PAD | Freq: Every day | CUTANEOUS | Status: DC
  Administered 2020-04-25 (×2): 6 via TOPICAL

## 2020-04-24 MED ORDER — SALINE SPRAY 0.65 % NA SOLN
1.0000 | NASAL | Status: DC | PRN
  Filled 2020-04-24: qty 44

## 2020-04-24 MED ORDER — LIDOCAINE 2% (20 MG/ML) 5 ML SYRINGE
INTRAMUSCULAR | Status: AC
  Filled 2020-04-24: qty 5

## 2020-04-24 MED ORDER — SENNOSIDES-DOCUSATE SODIUM 8.6-50 MG PO TABS: 1.0000 | ORAL_TABLET | Freq: Every evening | ORAL | Status: DC | PRN

## 2020-04-24 MED ORDER — PANTOPRAZOLE SODIUM 40 MG IV SOLR
40.0000 mg | Freq: Every day | INTRAVENOUS | Status: DC
  Administered 2020-04-24 – 2020-04-25 (×2): 40 mg via INTRAVENOUS
  Filled 2020-04-24 (×2): qty 40

## 2020-04-24 MED ORDER — HEPARIN SODIUM (PORCINE) 5000 UNIT/ML IJ SOLN
5000.0000 [IU] | Freq: Three times a day (TID) | INTRAMUSCULAR | Status: DC
  Administered 2020-04-25 – 2020-04-26 (×4): 5000 [IU] via SUBCUTANEOUS
  Filled 2020-04-24 (×3): qty 1

## 2020-04-24 MED ORDER — SUGAMMADEX SODIUM 200 MG/2ML IV SOLN
INTRAVENOUS | Status: DC | PRN
  Administered 2020-04-24: 400 mg via INTRAVENOUS

## 2020-04-24 MED ORDER — PROPOFOL 10 MG/ML IV BOLUS
INTRAVENOUS | Status: AC
  Filled 2020-04-24: qty 20

## 2020-04-24 MED ORDER — ACETAMINOPHEN 650 MG RE SUPP: 650.0000 mg | RECTAL | Status: DC | PRN

## 2020-04-24 MED ORDER — SODIUM CHLORIDE 0.9 % IV SOLN
INTRAVENOUS | Status: DC | PRN
  Administered 2020-04-24: 1000 mL via INTRAVENOUS

## 2020-04-24 MED ORDER — GABAPENTIN 300 MG PO CAPS
300.0000 mg | ORAL_CAPSULE | Freq: Three times a day (TID) | ORAL | Status: DC
  Administered 2020-04-24 – 2020-04-26 (×6): 300 mg via ORAL
  Filled 2020-04-24 (×6): qty 1

## 2020-04-24 MED ORDER — MUPIROCIN CALCIUM 2 % EX CREA
TOPICAL_CREAM | CUTANEOUS | Status: AC
  Filled 2020-04-24: qty 15

## 2020-04-24 MED ORDER — ONDANSETRON HCL 4 MG/2ML IJ SOLN
INTRAMUSCULAR | Status: AC
  Filled 2020-04-24: qty 2

## 2020-04-24 MED ORDER — PROPOFOL 500 MG/50ML IV EMUL
INTRAVENOUS | Status: DC | PRN
  Administered 2020-04-24: 25 ug/kg/min via INTRAVENOUS

## 2020-04-24 MED ORDER — APREPITANT 40 MG PO CAPS
40.0000 mg | ORAL_CAPSULE | ORAL | Status: AC
  Administered 2020-04-24: 40 mg via ORAL
  Filled 2020-04-24: qty 1

## 2020-04-24 MED ORDER — LABETALOL HCL 5 MG/ML IV SOLN: 10.0000 mg | INTRAVENOUS | Status: DC | PRN

## 2020-04-24 MED ORDER — POTASSIUM CHLORIDE IN NACL 20-0.9 MEQ/L-% IV SOLN
INTRAVENOUS | Status: DC
  Filled 2020-04-24 (×2): qty 1000

## 2020-04-24 MED ORDER — ONDANSETRON HCL 4 MG/2ML IJ SOLN
INTRAMUSCULAR | Status: DC | PRN
  Administered 2020-04-24: 4 mg via INTRAVENOUS

## 2020-04-24 MED ORDER — INSULIN ASPART 100 UNIT/ML ~~LOC~~ SOLN
3.0000 [IU] | Freq: Once | SUBCUTANEOUS | Status: AC
  Administered 2020-04-24: 3 [IU] via SUBCUTANEOUS

## 2020-04-24 MED ORDER — HYDROCORTISONE NA SUCCINATE PF 100 MG IJ SOLR
INTRAMUSCULAR | Status: DC | PRN
  Administered 2020-04-24: 100 mg via INTRAVENOUS

## 2020-04-24 MED ORDER — DOCUSATE SODIUM 100 MG PO CAPS
100.0000 mg | ORAL_CAPSULE | Freq: Two times a day (BID) | ORAL | Status: DC
  Administered 2020-04-24 – 2020-04-26 (×4): 100 mg via ORAL
  Filled 2020-04-24 (×2): qty 1

## 2020-04-24 MED ORDER — TEMAZEPAM 15 MG PO CAPS: 15.0000 mg | ORAL_CAPSULE | Freq: Every evening | ORAL | Status: DC | PRN

## 2020-04-24 MED ORDER — HYDROCODONE-ACETAMINOPHEN 5-325 MG PO TABS: 1.0000 | ORAL_TABLET | ORAL | Status: DC | PRN

## 2020-04-24 MED ORDER — DEXAMETHASONE SODIUM PHOSPHATE 10 MG/ML IJ SOLN
INTRAMUSCULAR | Status: DC | PRN
  Administered 2020-04-24: 5 mg via INTRAVENOUS

## 2020-04-24 MED ORDER — ONDANSETRON HCL 4 MG/2ML IJ SOLN
4.0000 mg | INTRAMUSCULAR | Status: DC | PRN
  Administered 2020-04-24: 4 mg via INTRAVENOUS
  Filled 2020-04-24: qty 2

## 2020-04-24 MED ORDER — FENTANYL CITRATE (PF) 100 MCG/2ML IJ SOLN
25.0000 ug | INTRAMUSCULAR | Status: DC | PRN
Start: 2020-04-24 — End: 2020-04-24

## 2020-04-24 MED ORDER — ZINC SULFATE 220 (50 ZN) MG PO CAPS
220.0000 mg | ORAL_CAPSULE | Freq: Every day | ORAL | Status: DC
  Administered 2020-04-25 – 2020-04-26 (×2): 220 mg via ORAL
  Filled 2020-04-24 (×2): qty 1

## 2020-04-24 MED ORDER — LACTATED RINGERS IV SOLN: INTRAVENOUS | Status: DC

## 2020-04-24 MED ORDER — CHLORHEXIDINE GLUCONATE CLOTH 2 % EX PADS: 6.0000 | MEDICATED_PAD | Freq: Once | CUTANEOUS | Status: DC

## 2020-04-24 MED ORDER — CLINDAMYCIN PHOSPHATE 300 MG/50ML IV SOLN
300.0000 mg | Freq: Four times a day (QID) | INTRAVENOUS | Status: AC
  Administered 2020-04-24 – 2020-04-25 (×3): 300 mg via INTRAVENOUS
  Filled 2020-04-24 (×4): qty 50

## 2020-04-24 MED ORDER — CEPHALEXIN 500 MG PO CAPS
500.0000 mg | ORAL_CAPSULE | Freq: Three times a day (TID) | ORAL | Status: DC
  Administered 2020-04-24 – 2020-04-26 (×6): 500 mg via ORAL
  Filled 2020-04-24 (×7): qty 1

## 2020-04-24 MED ORDER — ROCURONIUM BROMIDE 10 MG/ML (PF) SYRINGE
PREFILLED_SYRINGE | INTRAVENOUS | Status: AC
  Filled 2020-04-24: qty 10

## 2020-04-24 MED ORDER — AMISULPRIDE (ANTIEMETIC) 5 MG/2ML IV SOLN: 10.0000 mg | Freq: Once | INTRAVENOUS | Status: DC | PRN

## 2020-04-24 MED ORDER — AMLODIPINE BESYLATE 5 MG PO TABS
5.0000 mg | ORAL_TABLET | Freq: Every day | ORAL | Status: DC
  Administered 2020-04-25 – 2020-04-26 (×2): 5 mg via ORAL
  Filled 2020-04-24 (×2): qty 1

## 2020-04-24 MED ORDER — THROMBIN 5000 UNITS EX SOLR
CUTANEOUS | Status: AC
  Filled 2020-04-24: qty 10000

## 2020-04-24 MED ORDER — MAGNESIUM CITRATE PO SOLN: 1.0000 | Freq: Once | ORAL | Status: DC | PRN

## 2020-04-24 MED ORDER — CHLORHEXIDINE GLUCONATE 0.12 % MT SOLN
15.0000 mL | Freq: Once | OROMUCOSAL | Status: AC
  Administered 2020-04-24: 15 mL via OROMUCOSAL
  Filled 2020-04-24: qty 15

## 2020-04-24 MED ORDER — THROMBIN 5000 UNITS EX SOLR
CUTANEOUS | Status: DC | PRN
  Administered 2020-04-24 (×2): 5000 [IU] via TOPICAL

## 2020-04-24 MED ORDER — ORAL CARE MOUTH RINSE: 15.0000 mL | Freq: Once | OROMUCOSAL | Status: AC

## 2020-04-24 MED ORDER — FENTANYL CITRATE (PF) 250 MCG/5ML IJ SOLN
INTRAMUSCULAR | Status: AC
  Filled 2020-04-24: qty 5

## 2020-04-24 MED ORDER — LIDOCAINE-EPINEPHRINE 1 %-1:100000 IJ SOLN
INTRAMUSCULAR | Status: DC | PRN
  Administered 2020-04-24: 7 mL

## 2020-04-24 MED ORDER — LIDOCAINE-EPINEPHRINE 1 %-1:100000 IJ SOLN
INTRAMUSCULAR | Status: AC
  Filled 2020-04-24: qty 1

## 2020-04-24 MED ORDER — HEMOSTATIC AGENTS (NO CHARGE) OPTIME
TOPICAL | Status: DC | PRN
  Administered 2020-04-24 (×3): 1 via TOPICAL

## 2020-04-24 MED ORDER — OXYMETAZOLINE HCL 0.05 % NA SOLN
NASAL | Status: AC
  Filled 2020-04-24: qty 30

## 2020-04-24 MED ORDER — VITAMIN D3 25 MCG (1000 UNIT) PO TABS
2000.0000 [IU] | ORAL_TABLET | Freq: Every day | ORAL | Status: DC
  Administered 2020-04-25 – 2020-04-26 (×2): 2000 [IU] via ORAL
  Filled 2020-04-24 (×4): qty 2

## 2020-04-24 MED ORDER — INSULIN ASPART 100 UNIT/ML ~~LOC~~ SOLN
SUBCUTANEOUS | Status: AC
  Filled 2020-04-24: qty 1

## 2020-04-24 MED ORDER — LIDOCAINE 2% (20 MG/ML) 5 ML SYRINGE
INTRAMUSCULAR | Status: DC | PRN
  Administered 2020-04-24: 100 mg via INTRAVENOUS

## 2020-04-24 MED ORDER — MUPIROCIN CALCIUM 2 % EX CREA
TOPICAL_CREAM | CUTANEOUS | Status: DC | PRN
  Administered 2020-04-24: 1 via TOPICAL

## 2020-04-24 MED ORDER — TRIAMCINOLONE ACETONIDE 40 MG/ML IJ SUSP
INTRAMUSCULAR | Status: AC
  Filled 2020-04-24: qty 5

## 2020-04-24 MED ORDER — OXYMETAZOLINE HCL 0.05 % NA SOLN
NASAL | Status: DC | PRN
  Administered 2020-04-24: 1

## 2020-04-24 MED ORDER — FENTANYL CITRATE (PF) 250 MCG/5ML IJ SOLN
INTRAMUSCULAR | Status: DC | PRN
  Administered 2020-04-24: 25 ug via INTRAVENOUS
  Administered 2020-04-24: 100 ug via INTRAVENOUS
  Administered 2020-04-24: 50 ug via INTRAVENOUS
  Administered 2020-04-24: 25 ug via INTRAVENOUS

## 2020-04-24 MED ORDER — SODIUM CHLORIDE 0.9 % IR SOLN
Status: DC | PRN
  Administered 2020-04-24 (×2): 1000 mL

## 2020-04-24 MED ORDER — PROPOFOL 10 MG/ML IV BOLUS
INTRAVENOUS | Status: DC | PRN
  Administered 2020-04-24: 110 mg via INTRAVENOUS

## 2020-04-24 MED ORDER — EPHEDRINE SULFATE-NACL 50-0.9 MG/10ML-% IV SOSY
PREFILLED_SYRINGE | INTRAVENOUS | Status: DC | PRN
  Administered 2020-04-24 (×2): 5 mg via INTRAVENOUS

## 2020-04-24 MED ORDER — INSULIN ASPART 100 UNIT/ML ~~LOC~~ SOLN
0.0000 [IU] | Freq: Every day | SUBCUTANEOUS | Status: DC
  Administered 2020-04-24 – 2020-04-25 (×2): 2 [IU] via SUBCUTANEOUS

## 2020-04-24 MED ORDER — 0.9 % SODIUM CHLORIDE (POUR BTL) OPTIME
TOPICAL | Status: DC | PRN
  Administered 2020-04-24 (×2): 1000 mL

## 2020-04-24 MED ORDER — MORPHINE SULFATE (PF) 2 MG/ML IV SOLN
1.0000 mg | INTRAVENOUS | Status: DC | PRN
  Administered 2020-04-24 – 2020-04-25 (×2): 2 mg via INTRAVENOUS
  Filled 2020-04-24 (×2): qty 1

## 2020-04-24 MED ORDER — COQ10 100 MG PO CAPS: 100.0000 mg | ORAL_CAPSULE | Freq: Every day | ORAL | Status: DC

## 2020-04-24 MED ORDER — PROMETHAZINE HCL 25 MG PO TABS
12.5000 mg | ORAL_TABLET | ORAL | Status: DC | PRN
  Administered 2020-04-24: 12.5 mg via ORAL
  Filled 2020-04-24: qty 1

## 2020-04-24 MED ORDER — DEXAMETHASONE SODIUM PHOSPHATE 10 MG/ML IJ SOLN
INTRAMUSCULAR | Status: AC
  Filled 2020-04-24: qty 1

## 2020-04-24 MED ORDER — NALOXONE HCL 0.4 MG/ML IJ SOLN: 0.0800 mg | INTRAMUSCULAR | Status: DC | PRN

## 2020-04-24 MED ORDER — ROCURONIUM BROMIDE 10 MG/ML (PF) SYRINGE
PREFILLED_SYRINGE | INTRAVENOUS | Status: DC | PRN
  Administered 2020-04-24: 60 mg via INTRAVENOUS
  Administered 2020-04-24: 20 mg via INTRAVENOUS
  Administered 2020-04-24: 10 mg via INTRAVENOUS

## 2020-04-24 MED ORDER — PSYLLIUM 95 % PO PACK
1.0000 | PACK | Freq: Every day | ORAL | Status: DC
  Administered 2020-04-25 – 2020-04-26 (×2): 1 via ORAL
  Filled 2020-04-24 (×2): qty 1

## 2020-04-24 MED ORDER — INSULIN ASPART 100 UNIT/ML ~~LOC~~ SOLN
0.0000 [IU] | Freq: Three times a day (TID) | SUBCUTANEOUS | Status: DC
  Administered 2020-04-25 (×2): 2 [IU] via SUBCUTANEOUS
  Administered 2020-04-25 – 2020-04-26 (×2): 1 [IU] via SUBCUTANEOUS

## 2020-04-24 MED ORDER — PHENYLEPHRINE HCL-NACL 10-0.9 MG/250ML-% IV SOLN
INTRAVENOUS | Status: DC | PRN
  Administered 2020-04-24: 20 ug/min via INTRAVENOUS

## 2020-04-24 MED ORDER — B COMPLEX-C PO TABS
1.0000 | ORAL_TABLET | Freq: Every day | ORAL | Status: DC
  Administered 2020-04-25 – 2020-04-26 (×2): 1 via ORAL
  Filled 2020-04-24 (×2): qty 1

## 2020-04-24 MED ORDER — ARTIFICIAL TEARS OPHTHALMIC OINT
TOPICAL_OINTMENT | OPHTHALMIC | Status: AC
  Filled 2020-04-24: qty 3.5

## 2020-04-24 MED ORDER — HYDROCORTISONE 20 MG PO TABS
50.0000 mg | ORAL_TABLET | Freq: Three times a day (TID) | ORAL | Status: DC
  Administered 2020-04-24 – 2020-04-25 (×3): 50 mg via ORAL
  Filled 2020-04-24 (×6): qty 1

## 2020-04-24 MED ORDER — DIPHENHYDRAMINE HCL 50 MG/ML IJ SOLN
INTRAMUSCULAR | Status: DC | PRN
  Administered 2020-04-24: 12.5 mg via INTRAVENOUS

## 2020-04-24 MED ORDER — ONDANSETRON HCL 4 MG PO TABS: 4.0000 mg | ORAL_TABLET | ORAL | Status: DC | PRN

## 2020-04-24 MED ORDER — OXYMETAZOLINE HCL 0.05 % NA SOLN
NASAL | Status: DC | PRN
  Administered 2020-04-24: 2 via NASAL

## 2020-04-24 MED ORDER — DIPHENHYDRAMINE HCL 50 MG/ML IJ SOLN
INTRAMUSCULAR | Status: AC
  Filled 2020-04-24: qty 1

## 2020-04-24 MED ORDER — ASCORBIC ACID 500 MG PO TABS
1000.0000 mg | ORAL_TABLET | Freq: Every day | ORAL | Status: DC
  Administered 2020-04-25 – 2020-04-26 (×2): 1000 mg via ORAL
  Filled 2020-04-24: qty 2

## 2020-04-24 MED ORDER — LEVETIRACETAM IN NACL 500 MG/100ML IV SOLN
500.0000 mg | Freq: Two times a day (BID) | INTRAVENOUS | Status: DC
  Administered 2020-04-24 – 2020-04-26 (×4): 500 mg via INTRAVENOUS
  Filled 2020-04-24 (×4): qty 100

## 2020-04-24 MED ORDER — SODIUM CHLORIDE 0.9 % IV SOLN: INTRAVENOUS | Status: DC | PRN

## 2020-04-24 MED ORDER — PHENYLEPHRINE 40 MCG/ML (10ML) SYRINGE FOR IV PUSH (FOR BLOOD PRESSURE SUPPORT)
PREFILLED_SYRINGE | INTRAVENOUS | Status: AC
  Filled 2020-04-24: qty 10

## 2020-04-24 MED ORDER — BISACODYL 5 MG PO TBEC
5.0000 mg | DELAYED_RELEASE_TABLET | Freq: Every day | ORAL | Status: DC | PRN
Start: 2020-04-24 — End: 2020-04-26

## 2020-04-24 MED ORDER — MAGNESIUM OXIDE 400 (241.3 MG) MG PO TABS
400.0000 mg | ORAL_TABLET | Freq: Every day | ORAL | Status: DC
  Administered 2020-04-25 – 2020-04-26 (×2): 400 mg via ORAL
  Filled 2020-04-24 (×2): qty 1

## 2020-04-24 MED ORDER — TAMSULOSIN HCL 0.4 MG PO CAPS
0.4000 mg | ORAL_CAPSULE | Freq: Every day | ORAL | Status: DC
  Administered 2020-04-25 – 2020-04-26 (×2): 0.4 mg via ORAL
  Filled 2020-04-24 (×3): qty 1

## 2020-04-24 MED ORDER — ACETAMINOPHEN 325 MG PO TABS
650.0000 mg | ORAL_TABLET | ORAL | Status: DC | PRN
  Administered 2020-04-25 – 2020-04-26 (×2): 650 mg via ORAL
  Filled 2020-04-24 (×2): qty 2

## 2020-04-24 MED ORDER — ADULT MULTIVITAMIN W/MINERALS CH
1.0000 | ORAL_TABLET | Freq: Every day | ORAL | Status: DC
  Administered 2020-04-25 – 2020-04-26 (×2): 1 via ORAL
  Filled 2020-04-24 (×2): qty 1

## 2020-04-24 MED ORDER — CEFAZOLIN SODIUM-DEXTROSE 2-4 GM/100ML-% IV SOLN
2.0000 g | INTRAVENOUS | Status: AC
  Administered 2020-04-24: 2 g via INTRAVENOUS
  Filled 2020-04-24: qty 100

## 2020-04-24 MED ORDER — DUTASTERIDE 0.5 MG PO CAPS
0.5000 mg | ORAL_CAPSULE | Freq: Every day | ORAL | Status: DC
  Administered 2020-04-24 – 2020-04-26 (×3): 0.5 mg via ORAL
  Filled 2020-04-24 (×3): qty 1

## 2020-04-24 MED ORDER — LISINOPRIL 20 MG PO TABS
20.0000 mg | ORAL_TABLET | Freq: Every day | ORAL | Status: DC
  Administered 2020-04-25 – 2020-04-26 (×2): 20 mg via ORAL
  Filled 2020-04-24 (×2): qty 1

## 2020-04-24 MED ORDER — POTASSIUM 99 MG PO TABS: 99.0000 mg | ORAL_TABLET | Freq: Every day | ORAL | Status: DC

## 2020-04-24 MED ORDER — CEPHALEXIN 500 MG PO CAPS: 500.0000 mg | ORAL_CAPSULE | Freq: Three times a day (TID) | ORAL | 0 refills | Status: AC

## 2020-04-24 SURGICAL SUPPLY — 86 items
BAND RUBBER #18 3X1/16 STRL (MISCELLANEOUS) IMPLANT
BENZOIN TINCTURE PRP APPL 2/3 (GAUZE/BANDAGES/DRESSINGS) IMPLANT
BLADE EYE SICKLE 84 5 BEAV (BLADE) IMPLANT
BLADE RAD40 ROTATE 4M 4 5PK (BLADE) IMPLANT
BLADE ROTATE TRICUT 4X13 M4 (BLADE) ×2 IMPLANT
BLADE SURG 15 STRL LF DISP TIS (BLADE) IMPLANT
BLADE SURG 15 STRL SS (BLADE)
BUR DIAMOND 13X5 70D (BURR) IMPLANT
BUR DIAMOND CURV 15X5 15D (BURR) IMPLANT
BUR MATCHSTICK NEURO 3.0 LAGG (BURR) IMPLANT
BUR TAPER CHOANAL ATRESIA 30K (BURR) ×2 IMPLANT
CANISTER SUCT 3000ML PPV (MISCELLANEOUS) ×6 IMPLANT
CARTRIDGE OIL MAESTRO DRILL (MISCELLANEOUS) IMPLANT
CATH ROBINSON RED A/P 14FR (CATHETERS) IMPLANT
COAGULATOR SUCT 8FR VV (MISCELLANEOUS) ×2 IMPLANT
COTTONBALL LRG STERILE PKG (GAUZE/BANDAGES/DRESSINGS) IMPLANT
COVER WAND RF STERILE (DRAPES) ×2 IMPLANT
DECANTER SPIKE VIAL GLASS SM (MISCELLANEOUS) ×2 IMPLANT
DERMABOND ADVANCED (GAUZE/BANDAGES/DRESSINGS) ×1
DERMABOND ADVANCED .7 DNX12 (GAUZE/BANDAGES/DRESSINGS) ×1 IMPLANT
DIFFUSER DRILL AIR PNEUMATIC (MISCELLANEOUS) ×2 IMPLANT
DRAIN SUBARACHNOID (WOUND CARE) IMPLANT
DRAPE C-ARM 42X72 X-RAY (DRAPES) IMPLANT
DRAPE MICROSCOPE LEICA (MISCELLANEOUS) IMPLANT
DRESSING NASAL POPE 10X1.5X2.5 (GAUZE/BANDAGES/DRESSINGS) IMPLANT
DRSG NASAL POPE 10X1.5X2.5 (GAUZE/BANDAGES/DRESSINGS)
DRSG NASOPORE 8CM (GAUZE/BANDAGES/DRESSINGS) ×2 IMPLANT
DURAPREP 26ML APPLICATOR (WOUND CARE) ×2 IMPLANT
DURASEAL APPLICATOR TIP (TIP) ×2 IMPLANT
DURASEAL SPINE SEALANT 3ML (MISCELLANEOUS) ×2 IMPLANT
ELECT COATED BLADE 2.86 ST (ELECTRODE) IMPLANT
ELECT NEEDLE TIP 2.8 STRL (NEEDLE) IMPLANT
ELECT REM PT RETURN 9FT ADLT (ELECTROSURGICAL) ×4
ELECTRODE REM PT RTRN 9FT ADLT (ELECTROSURGICAL) ×2 IMPLANT
FORCEPS BIPOLAR SPETZLER 8 1.0 (NEUROSURGERY SUPPLIES) ×2 IMPLANT
GAUZE PACKING FOLDED 2  STR (GAUZE/BANDAGES/DRESSINGS)
GAUZE PACKING FOLDED 2 STR (GAUZE/BANDAGES/DRESSINGS) IMPLANT
GAUZE SPONGE 4X4 12PLY STRL (GAUZE/BANDAGES/DRESSINGS) IMPLANT
GLOVE BIOGEL M 7.0 STRL (GLOVE) ×4 IMPLANT
GLOVE ECLIPSE 6.5 STRL STRAW (GLOVE) ×4 IMPLANT
GLOVE EXAM NITRILE XL STR (GLOVE) IMPLANT
GOWN STRL REUS W/ TWL LRG LVL3 (GOWN DISPOSABLE) ×4 IMPLANT
GOWN STRL REUS W/ TWL XL LVL3 (GOWN DISPOSABLE) ×1 IMPLANT
GOWN STRL REUS W/TWL 2XL LVL3 (GOWN DISPOSABLE) IMPLANT
GOWN STRL REUS W/TWL LRG LVL3 (GOWN DISPOSABLE) ×8
GOWN STRL REUS W/TWL XL LVL3 (GOWN DISPOSABLE) ×2
HEMOSTAT SURGICEL 2X14 (HEMOSTASIS) ×2 IMPLANT
KIT BASIN OR (CUSTOM PROCEDURE TRAY) ×4 IMPLANT
KIT TURNOVER KIT B (KITS) ×4 IMPLANT
NEEDLE HYPO 25GX1X1/2 BEV (NEEDLE) ×2 IMPLANT
NEEDLE HYPO 25X1 1.5 SAFETY (NEEDLE) ×2 IMPLANT
NEEDLE SPNL 22GX3.5 QUINCKE BK (NEEDLE) ×2 IMPLANT
NEEDLE SPNL 25GX3.5 QUINCKE BL (NEEDLE) ×2 IMPLANT
NS IRRIG 1000ML POUR BTL (IV SOLUTION) ×4 IMPLANT
OIL CARTRIDGE MAESTRO DRILL (MISCELLANEOUS)
PACK LAMINECTOMY NEURO (CUSTOM PROCEDURE TRAY) ×2 IMPLANT
PAD ARMBOARD 7.5X6 YLW CONV (MISCELLANEOUS) ×6 IMPLANT
PATTIES SURGICAL .25X.25 (GAUZE/BANDAGES/DRESSINGS) IMPLANT
PATTIES SURGICAL .5 X3 (DISPOSABLE) ×2 IMPLANT
SHEATH ENDOSCRUB 0 DEG (SHEATH) ×2 IMPLANT
SHEATH ENDOSCRUB 30 DEG (SHEATH) ×2 IMPLANT
SHEATH ENDOSCRUB 45 DEG (SHEATH) ×2 IMPLANT
SPECIMEN JAR SMALL (MISCELLANEOUS) ×2 IMPLANT
SPONGE LAP 4X18 RFD (DISPOSABLE) IMPLANT
SPONGE NEURO XRAY DETECT 1X3 (DISPOSABLE) ×2 IMPLANT
SPONGE SURGIFOAM ABS GEL SZ50 (HEMOSTASIS) IMPLANT
STAPLER SKIN PROX WIDE 3.9 (STAPLE) IMPLANT
STRIP CLOSURE SKIN 1/2X4 (GAUZE/BANDAGES/DRESSINGS) IMPLANT
SUT VIC AB 2-0 CT1 27 (SUTURE) ×2
SUT VIC AB 2-0 CT1 27XBRD (SUTURE) ×1 IMPLANT
SUT VIC AB 2-0 CT2 18 VCP726D (SUTURE) IMPLANT
SUT VIC AB 3-0 SH 8-18 (SUTURE) ×4 IMPLANT
SYR 5ML LL (SYRINGE) IMPLANT
SYR CONTROL 10ML LL (SYRINGE) ×2 IMPLANT
TOWEL GREEN STERILE (TOWEL DISPOSABLE) ×2 IMPLANT
TOWEL GREEN STERILE FF (TOWEL DISPOSABLE) ×4 IMPLANT
TRACKER ENT INSTRUMENT (MISCELLANEOUS) ×2 IMPLANT
TRACKER ENT PATIENT (MISCELLANEOUS) ×2 IMPLANT
TRAP SPECIMEN MUCUS 40CC (MISCELLANEOUS) IMPLANT
TRAY ENT MC OR (CUSTOM PROCEDURE TRAY) ×4 IMPLANT
TRAY FOLEY MTR SLVR 16FR STAT (SET/KITS/TRAYS/PACK) ×2 IMPLANT
TUBE CONNECTING 12X1/4 (SUCTIONS) ×2 IMPLANT
TUBING EXTENTION W/L.L. (IV SETS) ×2 IMPLANT
TUBING STRAIGHTSHOT EPS 5PK (TUBING) ×2 IMPLANT
UNDERPAD 30X36 HEAVY ABSORB (UNDERPADS AND DIAPERS) IMPLANT
WATER STERILE IRR 1000ML POUR (IV SOLUTION) ×4 IMPLANT

## 2020-04-24 NOTE — Progress Notes (Signed)
   ENT Progress Note:  Procedure(s): Transphenoidal resection of pituitary tumor TRANSPHENOIDAL APPROACH EXPOSURE   Subjective: Stable preop  Objective: Vital signs in last 24 hours: Temp:  [98.7 F (37.1 C)] 98.7 F (37.1 C) (02/11 0542) Pulse Rate:  [71] 71 (02/11 0542) Resp:  [17] 17 (02/11 0542) BP: (148)/(63) 148/63 (02/11 0542) SpO2:  [98 %] 98 % (02/11 0542) Weight:  [83.1 kg] 83.1 kg (02/11 0542) Weight change:     Intake/Output from previous day: No intake/output data recorded. Intake/Output this shift: No intake/output data recorded.  Labs: Recent Labs    04/21/20 1018  WBC 6.6  HGB 14.1  HCT 43.5  PLT 163   Recent Labs    04/21/20 1018  NA 137  K 4.3  CL 105  CO2 21*  GLUCOSE 123*  BUN 16  CALCIUM 9.6    Studies/Results: No results found.   PHYSICAL EXAM: Patent nasal passage No D/C   Assessment/Plan: Adm for Endoscopic trans-sphenoidal pituitary resection with Dr. Zenaida Deed 04/24/2020, 7:39 AM

## 2020-04-24 NOTE — Anesthesia Procedure Notes (Signed)
Arterial Line Insertion Start/End2/01/2021 6:55 AM, 04/24/2020 7:03 AM Performed by: Dorthea Cove, CRNA, CRNA  Patient location: Pre-op. Preanesthetic checklist: patient identified, IV checked, site marked, risks and benefits discussed, surgical consent, monitors and equipment checked, pre-op evaluation, timeout performed and anesthesia consent Lidocaine 1% used for infiltration Left, radial was placed Catheter size: 20 Fr Hand hygiene performed  and maximum sterile barriers used   Attempts: 1 Procedure performed without using ultrasound guided technique. Following insertion, dressing applied and Biopatch. Post procedure assessment: normal and unchanged  Patient tolerated the procedure well with no immediate complications.

## 2020-04-24 NOTE — Op Note (Signed)
Operative Note:  ENDOSCOPIC TRANSSPHENOIDAL PITUITARY RESECTION WITH NAVIGATION      Patient: Eric [name] record number: [id number]  Date:04/24/2020  Pre-operative Indications: 1.  Pituitary Mass       Postoperative Indications: Same  Surgical Procedure: 1. Endoscopic transsphenoidal pituitary resection with intraoperative navigation          Anesthesia: GET  Surgeon: Delsa Bern, M.D.  Neurosurgeon: Dr. Christella Noa  Complications: None  EBL: 100 cc  Findings: No evidence of intranasal pathology or infection. Naso-pore sphenoid packing placed.  Note: The neurosurgical component of the operative procedure is dictated as a separate operative note.   Brief History: The patient is a 75 y.o. male with a history of pituitary mass. The patient has a history of lightheadedness and head injury who underwent head CT scan after an acute fall.  Findings showed possible pituitary mass and further work-up including MRI was performed. The patient was referred to Dr. Christella Noa for neurosurgical evaluation.  Patient seen by me at [facility] preoperatively with review of nasal anatomy and sinus CT scan for navigation.  Given the patient's history and findings, the above surgical procedures were recommended, risks and benefits were discussed in detail with the patient.  They understand and agree with our plan for surgery which is scheduled at [facility] under general anesthesia.  Surgical Procedure: The patient is brought to the neurosurgical operating room on 04/24/2020 and placed in supine position on the operating table. General endotracheal anesthesia was established without difficulty. When the patient was adequately anesthetized, surgical timeout was performed with correct identification of the patient and the surgical procedure. The patient's nose was then injected with 7 cc of 1% lidocaine 1:100,000 dilution epinephrine which was injected in a submucosal  fashion. The patient's nose was then packed with Afrin-soaked cottonoid pledgets were left in place for approximately 10 minutes to allow for vasoconstriction and hemostasis.  The Xomed Fusion navigation headgear was applied and anatomic and surgical landmarks were identified and confirmed, navigation was used throughout the sinus component of the surgical procedure.  With the patient prepped draped and prepared for surgery, nasal endoscopy was performed on the patient's right.  The middle turbinate was carefully lateralized to allow access to the posterior aspect of the nasal passageway.  The right sphenoid sinus ostium was identified.  The inferior aspect of the superior turbinate was then resected with through-cutting forceps and a microdebrider.  The right sphenoid sinus ostium was enlarged in a superior and lateral direction using the microdebrider and through-cutting forceps to create a widely patent ostium.  Nasal endoscopy on the patient's left-hand side was then undertaken.  The left middle turbinate was lateralized and the posterior nasal cavity was visualized with identification of the left sphenoid sinus ostium using navigation.  The inferior aspect of the superior turbinate was resected and the sinus ostium was enlarged in the lateral and superior direction to create a wide sphenoid sinus ostium.  A posterior septectomy was then performed with a Surveyor, quantity.  Bone, cartilage and soft tissue was then resected to create a wide posterior septotomy.  The anterior face of the sphenoid sinus and sphenoid sinus septum were then resected with a combination of through-cutting forceps, osteotome and microdebrider to allow direct access to the entire posterior aspect of the sphenoid sinus and pituitary fossa.  Sphenoid sinus mucosa overlying the pituitary fossa was elevated and lateralized.  The anterior face of the pituitary fossa was demarcated using navigation.  With  adequate access to the pituitary  fossa the neurosurgical component of the procedure was begun by Dr. Christella Noa.  This is dictated as a separate operative report.  Resection of the pituitary tumor was undertaken using direct visualization of the 0 degree endoscope, navigation and blunt and sharp dissection.  With pituitary tumor resection completed, reconstruction was undertaken.  Previously obtained abdominal fat was placed in the pituitary fossa and DuraSeal was placed over the anterior face of the pituitary fossa.  No evidence of spinal fluid leak or active bleeding.  Surgicel was placed over the pituitary fossa defect and the sphenoid sinus was loosely packed with Naso-pore absorbable nasal packing.  There was no active bleeding and no evidence of spinal fluid leak.  The sphenoid sinus was carefully inspected, no further bleeding along the mucosal margins, sphenoidotomy sites or posterior septectomy.  The patient's nasal cavity was irrigated and suctioned.  Surgical sponge count was correct. An oral gastric tube was passed and the stomach contents were aspirated. Patient was awakened from anesthetic and transferred from the operating room to the recovery room in stable condition. There were no complications and blood loss was 100 cc.   Delsa Bern, M.D. [facility] 04/24/2020

## 2020-04-24 NOTE — Transfer of Care (Signed)
Immediate Anesthesia Transfer of Care Note  Patient: Eric [name]  Procedure(s) Performed: 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 (N/A ) TRANSPHENOIDAL APPROACH EXPOSURE (N/A )  Patient Location: PACU  Anesthesia Type:General  Level of Consciousness: awake and patient cooperative  Airway & Oxygen Therapy: Patient Spontanous Breathing and Patient connected to face mask oxygen  Post-op Assessment: Report given to RN and Post -op Vital signs reviewed and stable  Post vital signs: Reviewed and stable  Last Vitals:  Vitals Value Taken Time  BP 154/67   Temp    Pulse 67 04/24/20 1126  Resp 14 04/24/20 1126  SpO2 99 % 04/24/20 1126  Vitals shown include unvalidated device data.  Last Pain:  Vitals:   04/24/20 0609  TempSrc:   PainSc: 0-No pain         Complications: No complications documented.

## 2020-04-24 NOTE — Anesthesia Procedure Notes (Signed)
Procedure Name: Intubation Date/Time: 04/24/2020 7:57 AM Performed by: Dorthea Cove, CRNA Pre-anesthesia Checklist: Patient identified, Emergency Drugs available, Suction available and Patient being monitored Patient Re-evaluated:Patient Re-evaluated prior to induction Oxygen Delivery Method: Circle system utilized Preoxygenation: Pre-oxygenation with 100% oxygen Induction Type: IV induction Ventilation: Mask ventilation without difficulty Laryngoscope Size: Mac and 4 Grade View: Grade I Tube type: Oral Tube size: 7.5 mm Number of attempts: 1 Airway Equipment and Method: Stylet and Oral airway Placement Confirmation: ETT inserted through vocal cords under direct vision,  positive ETCO2 and breath sounds checked- equal and bilateral Secured at: 22 cm Tube secured with: Tape Dental Injury: Teeth and Oropharynx as per pre-operative assessment

## 2020-04-24 NOTE — Discharge Instructions (Signed)
Sinus/Nasal Instructions:  1. Limited activity 2. Liquid and soft diet 3. May bathe and shower 4. Saline nasal spray - 4 puffs/nostril every hour while awake, begin the morning after surgery 5. Elevate Head of Bed 6. No nose blowing/Open mouth sneeze 7. Alternate Tylenol and ibuprofen every 6 hours as needed for pain.  Call [facility]NT for any questions or concerns regarding sinus issues: [phone]

## 2020-04-24 NOTE — H&P (Signed)
BP (!) 148/63   Pulse 71   Temp 98.7 F (37.1 C) (Oral)   Resp 17   Ht 5\' 10"  (1.778 m)   Wt 83.1 kg   SpO2 98%   BMI 26.29 kg/m  Eric [name] comes in today for evaluation of an incidentally found pituitary mass.  He had a fall and some concussion-like symptoms, so he had a CT which showed a mass in the sellar region.  He then underwent an MRI and has a tumor which extends beyond the suprasellar portion and certainly it is compromising the optic nerves.  He denies any symptoms, pain, visual field changes, or any other kind of neurological problem.  From what he said, he does not remember getting lab testing.  It may have been done.  So, I do not know if this is a functioning pituitary tumor or not.     VITAL SIGNS :  He weighs 183 pounds.  Temperature is 97.1, blood pressure is 152/72, pulse is 67.  Pain is 0/10.     PAST MEDICAL HISTORY :  He has had knee surgery.  He has had lumbar surgery.  He has had a cholecystectomy.  He had no symptoms when he was initially evaluated.  He also has a history of insomnia, benign prostatic hypertrophy, benign essential hypertension; bladder cancer, which has been treated; hypercholesterolemia, and diabetes.  He has had a compression fracture of L4, former smoker, diverticulitis, hematuria, type 2 diabetes.     SOCIAL HISTORY :  He does not use alcohol.     MEDICATIONS :  Norvasc, Lisinopril, Alprazolam, Tamsulosin, Zinc, Dutasteride, Metamucil, Vitamin D3, Multivitamin, Gabapentin, Potassium, Cinnamon, Magnesium, Vitamin B12.  He was started on Ultram for pain medication.     FAMILY HISTORY :  Mother and father are both deceased.     PHYSICAL EXAMINATION :  Today, he is alert and oriented by 4, answering all questions appropriately.  Memory, language, attention span, and fund of knowledge are normal.  Pupils equal, round, and react to light.  Visual fields notable for what appears to be a left superior quadrant field deficit, but  I could not truly pick up anything else.  This is on simple confrontation. 5/5 strength in the upper and lower extremities.  No drift.  Normal muscle tone, bulk, and coordination.  Speech is clear and fluent.  Hearing intact to voice.  Uvula elevates to the midline.  Hearing though is diminished to voice.     IMAGING :  MRI was reviewed.  It shows a sellar suprasellar mass, consistent with a pituitary adenoma.  It does enhance uniformly.  It is compromising the optic nerves.  It is measured at 2.6 x 2.1 x 1.8 cm.    Eric [name] returns today with lab values. Lydia is slightly high at 13.1 with a reference interval of high of 12.4, LH is at 2.0, prolactin is 26.9 with a high of 15.2, IGF-1 is 47, and growth hormone is 0.1, so this is not a prolactinoma.  We need to proceed with a transsphenoidal pituitary resection.  His eyesight is worse than I had anticipated.  He has significant bitemporal loss of eyesight.  He understands that.      [name] :  I will get him in to see Dr. Wilburn Cornelia as quickly as we can and try to get him into the operating room.  I would like to see all of this done by the end of January.  I explained the transsphenoidal pituitary  resection to both he and his daughter.  They understand that there is some risk of brain damage.  There is a risk that we do not get all of the tumor, possible CSF leak, damage to the optic nerve and eyesight could be worse, but, I would not expect those things to occur.

## 2020-04-24 NOTE — Anesthesia Postprocedure Evaluation (Signed)
Anesthesia Post Note  Patient: Eric [name]  Procedure(s) Performed: 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 (N/A ) TRANSPHENOIDAL APPROACH EXPOSURE (N/A )     Patient location during evaluation: PACU Anesthesia Type: General Level of consciousness: sedated and patient cooperative Pain management: pain level controlled Vital Signs Assessment: post-procedure vital signs reviewed and stable Respiratory status: spontaneous breathing Cardiovascular status: stable Anesthetic complications: no   No complications documented.  Last Vitals:  Vitals:   04/24/20 1300 04/24/20 1400  BP:  (!) 114/55  Pulse:  64  Resp:  14  Temp: (!) 36.3 C   SpO2:  96%    Last Pain:  Vitals:   04/24/20 1300  TempSrc: Oral  PainSc:                  [name]

## 2020-04-25 ENCOUNTER — Encounter (HOSPITAL_COMMUNITY): Payer: Self-pay | Admitting: Neurosurgery

## 2020-04-25 LAB — BASIC METABOLIC PANEL
Anion gap: 11 (ref 5–15)
BUN: 17 mg/dL (ref 8–23)
CO2: 20 mmol/L — ABNORMAL LOW (ref 22–32)
Calcium: 8.9 mg/dL (ref 8.9–10.3)
Chloride: 111 mmol/L (ref 98–111)
Creatinine, Ser: 1.15 mg/dL (ref 0.61–1.24)
GFR, Estimated: >60 mL/min (ref >60)
Glucose, Bld: 144 mg/dL — ABNORMAL HIGH (ref 70–99)
Potassium: 4.1 mmol/L (ref 3.5–5.1)
Sodium: 142 mmol/L (ref 135–145)

## 2020-04-25 LAB — GLUCOSE, CAPILLARY
Glucose-Capillary: 146 mg/dL — ABNORMAL HIGH (ref 70–99)
Glucose-Capillary: 155 mg/dL — ABNORMAL HIGH (ref 70–99)
Glucose-Capillary: 184 mg/dL — ABNORMAL HIGH (ref 70–99)
Glucose-Capillary: 207 mg/dL — ABNORMAL HIGH (ref 70–99)

## 2020-04-25 LAB — CORTISOL-AM, BLOOD: Cortisol - AM: 13.2 ug/dL (ref 6.7–22.6)

## 2020-04-25 MED ORDER — HYDROCORTISONE 5 MG PO TABS
25.0000 mg | ORAL_TABLET | Freq: Three times a day (TID) | ORAL | Status: DC
  Administered 2020-04-25 – 2020-04-26 (×3): 25 mg via ORAL
  Filled 2020-04-25 (×3): qty 1

## 2020-04-25 NOTE — Evaluation (Signed)
Physical Therapy Evaluation Patient Details Name: Eric [name] MRN: [id number] DOB: Apr 12, 1945 Today's Date: 04/25/2020   History of Present Illness  Pt is a 75 y.o. M with significant PMH of diabetes, bladder CA who presents for evaluation of incidentally found pituitary mass. Now s/p Endoscopic transsphenoidal pituitary resection with intraoperative navigation.  Clinical Impression  Prior to admission, pt lives with his spouse and is independent. Pt presents fairly close to his functional baseline upon evaluation. Ambulating hallway distances with no assistive device without physical difficulty. Negotiated 12 steps with a right railing to simulate home set up. Did note a right lower quadrant visual deficit, although pt did not display any functional impairments I.e. able to locate signage and obstacles in hallway. Discussed supervision for activities such as cooking and no driving until cleared by physician. Pt with no further acute PT needs. Thank you for this consult.     Follow Up Recommendations No PT follow up    Equipment Recommendations  None recommended by PT    Recommendations for Other Services       Precautions / Restrictions Precautions Precautions: Fall Restrictions Weight Bearing Restrictions: No      Mobility  Bed Mobility Overal bed mobility: Modified Independent                  Transfers Overall transfer level: Independent Equipment used: None                Ambulation/Gait Ambulation/Gait assistance: Supervision Gait Distance (Feet): 250 Feet Assistive device: None Gait Pattern/deviations: Step-through pattern;Decreased stride length;Decreased dorsiflexion - right;Decreased dorsiflexion - left     General Gait Details: Supervision for safety slower pace, mild dynamic instability  Stairs Stairs: Yes Stairs assistance: Min guard Stair Management: One rail Right Number of Stairs: 12 General stair comments: Cues for step by  step  Wheelchair Mobility    Modified Rankin (Stroke Patients Only)       Balance Overall balance assessment: Mild deficits observed, not formally tested                                           Pertinent Vitals/Pain Pain Assessment: No/denies pain    Home Living Family/patient expects to be discharged to:: Private residence Living Arrangements: Spouse/significant other Available Help at Discharge: Family;Available 24 hours/day Type of Home: House Home Access: Stairs to enter   [employer] of Steps: 1 Home Layout: Able to live on main level with bedroom/bathroom;Two level Home Equipment: Walker - 2 wheels;Cane - single point      Prior Function Level of Independence: Independent               Hand Dominance        Extremity/Trunk Assessment   Upper Extremity Assessment Upper Extremity Assessment: Defer to OT evaluation    Lower Extremity Assessment Lower Extremity Assessment: RLE deficits/detail;LLE deficits/detail RLE Deficits / Details: Grossly 4/5 LLE Deficits / Details: Grossly 4/5       Communication   Communication: No difficulties  Cognition Arousal/Alertness: Awake/alert Behavior During Therapy: WFL for tasks assessed/performed Overall Cognitive Status: Within Functional Limits for tasks assessed                                        General Comments  Exercises     Assessment/Plan    PT Assessment Patent does not need any further PT services  PT Problem List Decreased strength;Decreased balance;Decreased mobility       PT Treatment Interventions      PT Goals (Current goals can be found in the Care Plan section)  Acute Rehab PT Goals Patient Stated Goal: continued independence PT Goal Formulation: All assessment and education complete, DC therapy    Frequency     Barriers to discharge        Co-evaluation               AM-PAC PT "6 Clicks" Mobility  Outcome  Measure Help needed turning from your back to your side while in a flat bed without using bedrails?: None Help needed moving from lying on your back to sitting on the side of a flat bed without using bedrails?: None Help needed moving to and from a bed to a chair (including a wheelchair)?: None Help needed standing up from a chair using your arms (e.g., wheelchair or bedside chair)?: None Help needed to walk in hospital room?: None Help needed climbing 3-5 steps with a railing? : A Little 6 Click Score: 23    End of Session Equipment Utilized During Treatment: Gait belt Activity Tolerance: Patient tolerated treatment well Patient left: in bed;with call bell/phone within reach;with family/visitor present Nurse Communication: Mobility status PT Visit Diagnosis: Unsteadiness on feet (R26.81)    Time: 5956-3875 PT Time Calculation (min) (ACUTE ONLY): 31 min   Charges:   PT Evaluation $PT Eval Moderate Complexity: 1 Mod PT Treatments $Therapeutic Activity: 8-22 mins        Wyona Almas, PT, DPT Acute Rehabilitation Services Pager [phone] Office [phone]   Deno Etienne 04/25/2020, 4:32 PM

## 2020-04-25 NOTE — Progress Notes (Signed)
  NEUROSURGERY PROGRESS NOTE   No issues overnight.  Minor HA today No rhinorrhea  EXAM:  BP (!) 110/48   Pulse 78   Temp (!) 100.8 F (38.2 C) (Axillary) Comment: RN notified, blankets removed  Resp 20   Ht 5\' 10"  (1.778 m)   Wt 83 kg   SpO2 90%   BMI 26.26 kg/m   Awake, alert, oriented  Speech fluent, appropriate  CN grossly intact  5/5 BUE/BLE  No rhinorrhea  IMPRESSION/PLAN 75 y.o. male POD1 transphenoidal resection of pituitary tumor. Doing well. No evidence of DI. Patient would like to stay one more day. - PT/OT today - anticipate d/c tomorrow

## 2020-04-26 LAB — GLUCOSE, CAPILLARY: Glucose-Capillary: 150 mg/dL — ABNORMAL HIGH (ref 70–99)

## 2020-04-26 MED ORDER — HYDROCODONE-ACETAMINOPHEN 5-325 MG PO TABS: 1.0000 | ORAL_TABLET | ORAL | 0 refills | Status: DC | PRN

## 2020-04-26 NOTE — Discharge Summary (Signed)
Physician Discharge Summary  Patient ID: Eric [name] MRN: [id number] DOB/AGE: [birth year] 77 y.o.  Admit date: 04/24/2020 Discharge date: 04/26/2020  Admission Diagnoses:  Pituitary mass  Discharge Diagnoses:  Same Principal Problem:   Pituitary mass [facility]) Active Problems:   Pituitary tumor   Discharged Condition: Stable  Hospital Course:  Eric [name] is a 75 y.o. male who was admitted for the below procedure. There were no post operative complications including DI. At time of discharge, pain was well controlled, ambulating with Pt/OT, tolerating po, voiding normal. Ready for discharge.   Treatments: Surgery Endoscopic transsphenoidal pituitary resection with intraoperative navigation  Discharge Exam: Blood pressure (!) 109/54, pulse 65, temperature 98.4 F (36.9 C), temperature source Oral, resp. rate (!) 21, height 5\' 10"  (1.778 m), weight 83 kg, SpO2 (!) 89 %. Awake, alert, oriented Speech fluent, appropriate CN grossly intact 5/5 BUE/BLE Wound c/d/i  Disposition: Discharge disposition: 01-Home or Self Care       Discharge Instructions    Call MD for:  difficulty breathing, headache or visual disturbances   Complete by: As directed    Call MD for:  persistant dizziness or light-headedness   Complete by: As directed    Call MD for:  redness, tenderness, or signs of infection (pain, swelling, redness, odor or green/yellow discharge around incision site)   Complete by: As directed    Call MD for:  severe uncontrolled pain   Complete by: As directed    Call MD for:  temperature >100.4   Complete by: As directed    Diet - low sodium heart healthy   Complete by: As directed    Driving Restrictions   Complete by: As directed    Do not drive until given clearance.   Increase activity slowly   Complete by: As directed    Lifting restrictions   Complete by: As directed    Do not lift anything >10lbs. Avoid bending and twisting in awkward  positions. Avoid bending at the back.   May shower / Bathe   Complete by: As directed    In 24 hours. Okay to wash wound with warm soapy water. Avoid scrubbing the wound. Pat dry.   No wound care   Complete by: As directed      Allergies as of 04/26/2020   No Known Allergies     Medication List    TAKE these medications   amLODipine 5 MG tablet Commonly known as: NORVASC Take 5 mg by mouth daily.   B-complex with vitamin C tablet Take 1 tablet by mouth daily.   Blood Sugar Balance Tabs Take 1 tablet by mouth in the morning and at bedtime. Glucocil   cephALEXin 500 MG capsule Commonly known as: Keflex Take 1 capsule (500 mg total) by mouth 3 (three) times daily for 10 days.   CoQ10 100 MG Caps Take 100 mg by mouth daily.   dutasteride 0.5 MG capsule Commonly known as: AVODART Take 0.5 mg by mouth daily.   gabapentin 300 MG capsule Commonly known as: NEURONTIN Take 300 mg by mouth in the morning, at noon, and at bedtime.   HYDROcodone-acetaminophen 5-325 MG tablet Commonly known as: NORCO/VICODIN Take 1 tablet by mouth every 4 (four) hours as needed for moderate pain.   lisinopril 20 MG tablet Commonly known as: ZESTRIL Take 20 mg by mouth daily.   Magnesium 400 MG Tabs Take 400 mg by mouth daily.   multivitamin with minerals Tabs tablet Take 1 tablet by  mouth daily.   Potassium 99 MG Tabs Take 99 mg by mouth daily.   psyllium 58.6 % powder Commonly known as: METAMUCIL Take 1 packet by mouth daily.   tamsulosin 0.4 MG Caps capsule Commonly known as: FLOMAX Take 0.4 mg by mouth daily.   temazepam 15 MG capsule Commonly known as: RESTORIL Take 15 mg by mouth at bedtime as needed for sleep.   vitamin C 1000 MG tablet Take 1,000 mg by mouth in the morning and at bedtime.   Vitamin D3 50 MCG (2000 UT) Tabs Take 2,000 Units by mouth daily.   Zinc 50 MG Tabs Take 50 mg by mouth daily.       Follow-up Information    Jerrell Belfast, MD In 2  weeks.   Specialty: Otolaryngology Contact information: [address] [place] [address] [phone]        Ashok Pall, MD Follow up.   Specialty: Neurosurgery Contact information: [address] [place] [address] [phone]               Signed: Traci Sermon 04/26/2020, 9:32 AM

## 2020-04-26 NOTE — Progress Notes (Signed)
  NEUROSURGERY PROGRESS NOTE   No issues overnight. Pt without complaint. No HA or visual changes. No nasal drainage x small amount of blood.  EXAM:  BP (!) 109/54 (BP Location: Right Arm)   Pulse 65   Temp 98.4 F (36.9 C) (Oral)   Resp (!) 21   Ht 5\' 10"  (1.778 m)   Wt 83 kg   SpO2 (!) 89%   BMI 26.26 kg/m   Awake, alert, oriented  Speech fluent, appropriate  CN grossly intact  5/5 BUE/BLE   LABS: Na 143 Am cortisol 13  IMPRESSION:  75 y.o. male POD#2 s/p transsphenoidal resection of pituitary tumor. Doing well neurologically, no DI, normal corticotropic function  PLAN: - can d/c home today

## 2020-04-26 NOTE — Progress Notes (Signed)
  NEUROSURGERY PROGRESS NOTE   No issues overnight. Doing well this am Minimal HA No rhinorrhea  EXAM:  BP (!) 109/54 (BP Location: Right Arm)   Pulse 65   Temp 98.4 F (36.9 C) (Oral)   Resp (!) 21   Ht 5\' 10"  (1.778 m)   Wt 83 kg   SpO2 (!) 89%   BMI 26.26 kg/m   Awake, alert, oriented  Speech fluent, appropriate  CN grossly intact  5/5 BUE/BLE  No obvious nasal drainage  IMPRESSION/PLAN 75 y.o. male POD2 transphenoidal resection of pituitary tumor. Doing well. No evidence of DI.  - D/C home today

## 2020-04-26 NOTE — Progress Notes (Signed)
Provided patient with discharge instructions. Answered all questions to satisfaction. All belongings sent home with patient.

## 2020-04-27 LAB — SURGICAL PATHOLOGY

## 2020-05-06 NOTE — Op Note (Signed)
04/24/2020  5:15 PM  PATIENT:  Eric [name]  75 y.o. male with a large mass in the sella extending above the sella compressing the optic nerves and optic chiasm. He is admitted for a pituitary adenoma resection.   PRE-OPERATIVE DIAGNOSIS:  Pituitary Adenoma  POST-OPERATIVE DIAGNOSIS:  Pituitary Adenoma  PROCEDURE:  Procedure(s): 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 TRANSPHENOIDAL APPROACH EXPOSURE  SURGEON: Surgeon(s): Ashok Pall, MD Jerrell Belfast, MD  ASSISTANTS:Shoemaker, Shanon Brow  ANESTHESIA:   general  EBL:  No intake/output data recorded.  BLOOD ADMINISTERED:none  CELL SAVER GIVEN:none  COUNT:per nursing  DRAINS: none   SPECIMEN:  Source of Specimen:  pituitary gland  DICTATION: Eric [name] was taken to the operating room, intubated, and placed under a general anesthetic without difficulty. He was positioned with his head on a horseshoe headrest.. He was prepped and draped in a sterile manner. His belly  Was prepped and draped also. I opened the skin with a 10 blade, and dissected to the layer of adipose under the skin surface. I then used scissors to develop a piece of fat large enough to occupy the sella once the resection was complete. I then controlled bleeding with monopolar cautery. I closed the incision with vicryl sutures, approximating the scarpa's fascia, subcutaneous and subcuticular layers.. I applied dermabond to dress the incision. Under separate cover Dr. Wilburn Cornelia will dictate the exposure and closure.  When the sphenoid sinus was identified I was called.  With Dr. Wilburn Cornelia holding the camera I proceeded with the tumor resection.  I cauterized the dura, then with a scalpel opened the dura. There was an immediate gush of fluid and tissue. I then using various dissectors removed tissue until visible brain was seen. At that point I felt that the resection was complete. I did explore the inferior, and  lateral quadrants, along with the superior aspect of the tumor. The fat graft was placed by Dr. Wilburn Cornelia and he completed his closure.   PLAN OF CARE: Admit to inpatient   PATIENT DISPOSITION:  PACU - hemodynamically stable.   Delay start of Pharmacological VTE agent (>24hrs) due to surgical blood loss or risk of bleeding:  yes

## 2020-05-11 DIAGNOSIS — E7849 Other hyperlipidemia: Secondary | ICD-10-CM | POA: Diagnosis not present

## 2020-05-11 DIAGNOSIS — I10 Essential (primary) hypertension: Secondary | ICD-10-CM | POA: Diagnosis not present

## 2020-05-27 ENCOUNTER — Other Ambulatory Visit: Payer: Self-pay | Admitting: Neurosurgery

## 2020-05-27 ENCOUNTER — Other Ambulatory Visit (HOSPITAL_COMMUNITY): Payer: Self-pay | Admitting: Neurosurgery

## 2020-05-27 DIAGNOSIS — D352 Benign neoplasm of pituitary gland: Secondary | ICD-10-CM

## 2020-06-02 DIAGNOSIS — E1142 Type 2 diabetes mellitus with diabetic polyneuropathy: Secondary | ICD-10-CM | POA: Diagnosis not present

## 2020-06-24 ENCOUNTER — Other Ambulatory Visit: Payer: Self-pay

## 2020-06-24 ENCOUNTER — Ambulatory Visit (HOSPITAL_COMMUNITY)
Admission: RE | Admit: 2020-06-24 | Discharge: 2020-06-24 | Disposition: A | Discharge status: Home / Self Care | Payer: Medicare Other | Source: Ambulatory Visit | Attending: Neurosurgery | Admitting: Neurosurgery

## 2020-06-24 DIAGNOSIS — R519 Headache, unspecified: Secondary | ICD-10-CM | POA: Diagnosis not present

## 2020-06-24 DIAGNOSIS — D352 Benign neoplasm of pituitary gland: Secondary | ICD-10-CM | POA: Diagnosis not present

## 2020-06-29 ENCOUNTER — Other Ambulatory Visit: Payer: Self-pay | Admitting: Neurosurgery

## 2020-06-29 DIAGNOSIS — S065X9A Traumatic subdural hemorrhage with loss of consciousness of unspecified duration, initial encounter: Secondary | ICD-10-CM

## 2020-06-29 DIAGNOSIS — S065XAA Traumatic subdural hemorrhage with loss of consciousness status unknown, initial encounter: Secondary | ICD-10-CM

## 2020-06-30 DIAGNOSIS — Z713 Dietary counseling and surveillance: Secondary | ICD-10-CM | POA: Diagnosis not present

## 2020-06-30 DIAGNOSIS — E1165 Type 2 diabetes mellitus with hyperglycemia: Secondary | ICD-10-CM | POA: Diagnosis not present

## 2020-06-30 DIAGNOSIS — Z299 Encounter for prophylactic measures, unspecified: Secondary | ICD-10-CM | POA: Diagnosis not present

## 2020-06-30 DIAGNOSIS — I10 Essential (primary) hypertension: Secondary | ICD-10-CM | POA: Diagnosis not present

## 2020-06-30 DIAGNOSIS — Z6826 Body mass index (BMI) 26.0-26.9, adult: Secondary | ICD-10-CM | POA: Diagnosis not present

## 2020-07-11 DIAGNOSIS — E7849 Other hyperlipidemia: Secondary | ICD-10-CM | POA: Diagnosis not present

## 2020-07-11 DIAGNOSIS — I10 Essential (primary) hypertension: Secondary | ICD-10-CM | POA: Diagnosis not present

## 2020-07-27 ENCOUNTER — Ambulatory Visit
Admission: RE | Admit: 2020-07-27 | Discharge: 2020-07-27 | Disposition: A | Discharge status: Home / Self Care | Payer: Medicare Other | Source: Ambulatory Visit | Attending: Neurosurgery | Admitting: Neurosurgery

## 2020-07-27 ENCOUNTER — Other Ambulatory Visit: Payer: Medicare Other

## 2020-07-27 DIAGNOSIS — I6200 Nontraumatic subdural hemorrhage, unspecified: Secondary | ICD-10-CM | POA: Diagnosis not present

## 2020-07-27 DIAGNOSIS — J3489 Other specified disorders of nose and nasal sinuses: Secondary | ICD-10-CM | POA: Diagnosis not present

## 2020-07-27 DIAGNOSIS — S065XAA Traumatic subdural hemorrhage with loss of consciousness status unknown, initial encounter: Secondary | ICD-10-CM

## 2020-07-27 DIAGNOSIS — E237 Disorder of pituitary gland, unspecified: Secondary | ICD-10-CM | POA: Diagnosis not present

## 2020-07-27 DIAGNOSIS — I619 Nontraumatic intracerebral hemorrhage, unspecified: Secondary | ICD-10-CM | POA: Diagnosis not present

## 2020-07-27 DIAGNOSIS — S065X9A Traumatic subdural hemorrhage with loss of consciousness of unspecified duration, initial encounter: Secondary | ICD-10-CM

## 2020-07-28 DIAGNOSIS — Z1339 Encounter for screening examination for other mental health and behavioral disorders: Secondary | ICD-10-CM | POA: Diagnosis not present

## 2020-07-28 DIAGNOSIS — I10 Essential (primary) hypertension: Secondary | ICD-10-CM | POA: Diagnosis not present

## 2020-07-28 DIAGNOSIS — R5383 Other fatigue: Secondary | ICD-10-CM | POA: Diagnosis not present

## 2020-07-28 DIAGNOSIS — E7800 Pure hypercholesterolemia, unspecified: Secondary | ICD-10-CM | POA: Diagnosis not present

## 2020-07-28 DIAGNOSIS — Z125 Encounter for screening for malignant neoplasm of prostate: Secondary | ICD-10-CM | POA: Diagnosis not present

## 2020-07-28 DIAGNOSIS — Z79899 Other long term (current) drug therapy: Secondary | ICD-10-CM | POA: Diagnosis not present

## 2020-07-28 DIAGNOSIS — M25512 Pain in left shoulder: Secondary | ICD-10-CM | POA: Diagnosis not present

## 2020-07-28 DIAGNOSIS — Z1331 Encounter for screening for depression: Secondary | ICD-10-CM | POA: Diagnosis not present

## 2020-07-28 DIAGNOSIS — Z0000 Encounter for general adult medical examination without abnormal findings: Secondary | ICD-10-CM | POA: Diagnosis not present

## 2020-07-28 DIAGNOSIS — Z299 Encounter for prophylactic measures, unspecified: Secondary | ICD-10-CM | POA: Diagnosis not present

## 2020-07-28 DIAGNOSIS — Z7189 Other specified counseling: Secondary | ICD-10-CM | POA: Diagnosis not present

## 2020-08-04 DIAGNOSIS — I10 Essential (primary) hypertension: Secondary | ICD-10-CM | POA: Diagnosis not present

## 2020-08-04 DIAGNOSIS — D352 Benign neoplasm of pituitary gland: Secondary | ICD-10-CM | POA: Diagnosis not present

## 2020-08-04 DIAGNOSIS — S065X9A Traumatic subdural hemorrhage with loss of consciousness of unspecified duration, initial encounter: Secondary | ICD-10-CM | POA: Diagnosis not present

## 2020-08-04 DIAGNOSIS — Z6825 Body mass index (BMI) 25.0-25.9, adult: Secondary | ICD-10-CM | POA: Diagnosis not present

## 2020-08-12 ENCOUNTER — Encounter: Payer: Self-pay | Admitting: Urology

## 2020-08-12 ENCOUNTER — Other Ambulatory Visit: Payer: Self-pay

## 2020-08-12 ENCOUNTER — Ambulatory Visit (INDEPENDENT_AMBULATORY_CARE_PROVIDER_SITE_OTHER): Payer: Medicare Other | Admitting: Urology

## 2020-08-12 VITALS — BP 131/73 | HR 66 | Ht 70.0 in

## 2020-08-12 DIAGNOSIS — H2513 Age-related nuclear cataract, bilateral: Secondary | ICD-10-CM | POA: Diagnosis not present

## 2020-08-12 DIAGNOSIS — H524 Presbyopia: Secondary | ICD-10-CM | POA: Diagnosis not present

## 2020-08-12 DIAGNOSIS — N401 Enlarged prostate with lower urinary tract symptoms: Secondary | ICD-10-CM | POA: Diagnosis not present

## 2020-08-12 DIAGNOSIS — C679 Malignant neoplasm of bladder, unspecified: Secondary | ICD-10-CM | POA: Diagnosis not present

## 2020-08-12 DIAGNOSIS — H5203 Hypermetropia, bilateral: Secondary | ICD-10-CM | POA: Diagnosis not present

## 2020-08-12 DIAGNOSIS — R339 Retention of urine, unspecified: Secondary | ICD-10-CM

## 2020-08-12 DIAGNOSIS — H52223 Regular astigmatism, bilateral: Secondary | ICD-10-CM | POA: Diagnosis not present

## 2020-08-12 DIAGNOSIS — N138 Other obstructive and reflux uropathy: Secondary | ICD-10-CM | POA: Diagnosis not present

## 2020-08-12 DIAGNOSIS — H43813 Vitreous degeneration, bilateral: Secondary | ICD-10-CM | POA: Diagnosis not present

## 2020-08-12 DIAGNOSIS — E119 Type 2 diabetes mellitus without complications: Secondary | ICD-10-CM | POA: Diagnosis not present

## 2020-08-12 DIAGNOSIS — H47631 Disorders of visual cortex in (due to) neoplasm, right side of brain: Secondary | ICD-10-CM | POA: Diagnosis not present

## 2020-08-12 DIAGNOSIS — H31113 Age-related choroidal atrophy, bilateral: Secondary | ICD-10-CM | POA: Diagnosis not present

## 2020-08-12 LAB — MICROSCOPIC EXAMINATION
Bacteria, UA: NONE SEEN
Epithelial Cells (non renal): NONE SEEN /hpf (ref 0–10)
RBC, Urine: NONE SEEN /hpf (ref 0–2)
Renal Epithel, UA: NONE SEEN /hpf
WBC, UA: NONE SEEN /hpf (ref 0–5)

## 2020-08-12 LAB — URINALYSIS, ROUTINE W REFLEX MICROSCOPIC
Bilirubin, UA: NEGATIVE
Glucose, UA: NEGATIVE
Ketones, UA: NEGATIVE
Leukocytes,UA: NEGATIVE
Nitrite, UA: NEGATIVE
Protein,UA: NEGATIVE
Specific Gravity, UA: 1.01 (ref 1.005–1.030)
Urobilinogen, Ur: 0.2 mg/dL (ref 0.2–1.0)
pH, UA: 5.5 (ref 5.0–7.5)

## 2020-08-12 MED ORDER — CIPROFLOXACIN HCL 500 MG PO TABS
500.0000 mg | ORAL_TABLET | Freq: Once | ORAL | Status: AC
  Administered 2020-08-12: 500 mg via ORAL

## 2020-08-12 MED ORDER — MIRABEGRON ER 25 MG PO TB24: 25.0000 mg | ORAL_TABLET | Freq: Every day | ORAL | 0 refills | Status: DC

## 2020-08-12 MED ORDER — TAMSULOSIN HCL 0.4 MG PO CAPS: 0.4000 mg | ORAL_CAPSULE | Freq: Two times a day (BID) | ORAL | 11 refills | Status: DC

## 2020-08-12 NOTE — Patient Instructions (Signed)

## 2020-08-12 NOTE — Progress Notes (Signed)
08/12/2020 3:11 PM   Eric [name] [birth year] [id number]  Referring provider: Arsenio Katz, NP [address],  [place] [address]  Hx of bladder cancer  HPI: Mr Eric [name] is a 75yo here for evaluation of bladder cancer and BPH. He is on flomax 0.4mg . He has a strong stream. He has urgency and urge incontinence 2-3x per day which bothers him. He has a hx of bladder cancer diagnosed in 2013 and then treated with BCG. No tumor recurrence since 2016. His last cystoscopy was 2021.  No hematuria or dysuria. UA today is normal.   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    Surgical History: Past Surgical History:  Procedure Laterality Date   BIOPSY  09/29/2016   Procedure: BIOPSY;  Surgeon: Rogene Houston, MD;  [facility];  Service: Endoscopy;;  colon   BIOPSY  11/14/2019   Procedure: BIOPSY;  Surgeon: Rogene Houston, MD;  [facility];  Service: Endoscopy;;   BLADDER SURGERY     2013   CHOLECYSTECTOMY     COLONOSCOPY N/A 09/29/2016   Procedure: COLONOSCOPY;  Surgeon: Rogene Houston, MD;  [facility];  Service: Endoscopy;  Laterality: N/A;  12:00   COLONOSCOPY N/A 11/14/2019   Procedure: COLONOSCOPY;  Surgeon: Rogene Houston, MD;  [facility];  Service: Endoscopy;  Laterality: N/A;  125, per office, pt knows new arrival time   CRANIOTOMY N/A 04/24/2020   Procedure: 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;  Surgeon: Ashok Pall, MD;  Location: [place];  Service: Neurosurgery;  Laterality: N/A;  3C/RM 20   ESOPHAGOGASTRODUODENOSCOPY N/A 11/14/2019   Procedure: ESOPHAGOGASTRODUODENOSCOPY (EGD);  Surgeon: Rogene Houston, MD;  [facility];  Service: Endoscopy;  Laterality: N/A;    knee athroscopy     TONSILLECTOMY     as child   TRANSPHENOIDAL APPROACH EXPOSURE N/A 04/24/2020   Procedure: TRANSPHENOIDAL APPROACH EXPOSURE;  Surgeon: Jerrell Belfast, MD;  Location: [place];  Service: ENT;  Laterality: N/A;    Home Medications:  Allergies as of 08/12/2020   No Known Allergies      Medication List        Accurate as of August 12, 2020  3:11 PM. If you have any questions, ask your nurse or doctor.          amLODipine 5 MG tablet Commonly known as: NORVASC Take 5 mg by mouth daily.   B-complex with vitamin C tablet Take 1 tablet by mouth daily.   Blood Sugar Balance Tabs Take 1 tablet by mouth in the morning and at bedtime. Glucocil   CoQ10 100 MG Caps Take 100 mg by mouth daily.   dutasteride 0.5 MG capsule Commonly known as: AVODART Take 0.5 mg by mouth daily.   gabapentin 300 MG capsule Commonly known as: NEURONTIN Take 300 mg by mouth in the morning, at noon, and at bedtime.   HYDROcodone-acetaminophen 5-325 MG tablet Commonly known as: NORCO/VICODIN Take 1 tablet by mouth every 4 (four) hours as needed for moderate pain.   lisinopril 20 MG tablet Commonly known as: ZESTRIL Take 20 mg by mouth daily.   Magnesium 400 MG Tabs Take 400 mg by mouth daily.  multivitamin with minerals Tabs tablet Take 1 tablet by mouth daily.   Potassium 99 MG Tabs Take 99 mg by mouth daily.   psyllium 58.6 % powder Commonly known as: METAMUCIL Take 1 packet by mouth daily.   tamsulosin 0.4 MG Caps capsule Commonly known as: FLOMAX Take 0.4 mg by mouth daily.   temazepam 15 MG capsule Commonly known as: RESTORIL Take 15 mg by mouth at bedtime as needed for sleep.   vitamin C 1000 MG tablet Take 1,000 mg by mouth in the morning and at bedtime.   Vitamin D3 50 MCG (2000 UT) Tabs Take 2,000 Units by mouth daily.   Zinc 50 MG Tabs Take 50 mg by mouth daily.        Allergies: No Known Allergies  Family History: Family History  Problem  Relation Age of Onset   Hypercholesterolemia Father    Hypercholesterolemia Mother    Colon cancer Neg Hx    Urolithiasis Neg Hx    Sudden death Neg Hx    Lupus Neg Hx    Sickle cell trait Neg Hx     Social History:  reports that he has never smoked. He has never used smokeless tobacco. He reports previous alcohol use. He reports that he does not use drugs.  ROS: All other review of systems were reviewed and are negative except what is noted above in HPI  Physical Exam: BP 131/73   Pulse 66   Ht 5\' 10"  (1.778 m)   BMI 26.26 kg/m   Constitutional:  Alert and oriented, No acute distress. HEENT: [place] AT, moist mucus membranes.  Trachea midline, no masses. Cardiovascular: No clubbing, cyanosis, or edema. Respiratory: Normal respiratory effort, no increased work of breathing. GI: Abdomen is soft, nontender, nondistended, no abdominal masses GU: No CVA tenderness.  Lymph: No cervical or inguinal lymphadenopathy. Skin: No rashes, bruises or suspicious lesions. Neurologic: Grossly intact, no focal deficits, moving all 4 extremities. Psychiatric: Normal mood and affect.  Laboratory Data: Lab Results  Component Value Date   WBC 8.6 04/24/2020   HGB 12.4 (L) 04/24/2020   HCT 37.2 (L) 04/24/2020   MCV 90.3 04/24/2020   PLT 160 04/24/2020    Lab Results  Component Value Date   CREATININE 1.15 04/25/2020    No results found for: PSA  No results found for: TESTOSTERONE  No results found for: HGBA1C  Urinalysis No results found for: COLORURINE, APPEARANCEUR, LABSPEC, PHURINE, GLUCOSEU, HGBUR, BILIRUBINUR, KETONESUR, PROTEINUR, UROBILINOGEN, NITRITE, LEUKOCYTESUR  No results found for: LABMICR, [place], RBCUA, LABEPIT, MUCUS, BACTERIA  Pertinent Imaging:  No results found for this or any previous visit.  No results found for this or any previous visit.  No results found for this or any previous visit.  No results found for this or any previous visit.  No results found  for this or any previous visit.  No results found for this or any previous visit.  No results found for this or any previous visit.  No results found for this or any previous visit.   Assessment & [name]:    1. Malignant neoplasm of urinary bladder, unspecified site (HCC) RTC 1 year for cystoscopy - Urinalysis, Routine w reflex microscopic  2. BPH with LUTS, incomplete bladder emptyin -Increase flomax to BID -RTC 6 weeks with PVR   No follow-ups on file.  Nicolette Bang, MD  [facility]    Cystoscopy Procedure Note  Patient identification was confirmed, informed consent was obtained, and patient was prepped  using Betadine solution.  Lidocaine jelly was administered per urethral meatus.     Pre-Procedure: - Inspection reveals a normal caliber ureteral meatus.  Procedure: The flexible cystoscope was introduced without difficulty - No urethral strictures/lesions are present. - Enlarged prostate  - Elevated bladder neck - Bilateral ureteral orifices identified - Bladder mucosa  reveals no ulcers, tumors, or lesions - No bladder stones - No trabeculation  Retroflexion shows no intravesical prostatic protrusion   Post-Procedure: - Patient tolerated the procedure well  Assessment/ [name]: Followup 1 year for cystoscopy  Nicolette Bang, MD

## 2020-08-12 NOTE — Progress Notes (Signed)
Urological Symptom Review  Patient is experiencing the following symptoms: Get up at night to urinate Leakage of urine Blood in urine Erection problems (male only) Kidney Stones  Review of Systems  Gastrointestinal (upper)  : Negative for upper GI symptoms  Gastrointestinal (lower) : Negative for lower GI symptoms  Constitutional : Negative for symptoms  Skin: Negative for skin symptoms  Eyes: Negative for eye symptoms  Ear/Nose/Throat : Negative for Ear/Nose/Throat symptoms  Hematologic/Lymphatic: Negative for Hematologic/Lymphatic symptoms  Cardiovascular : Negative for cardiovascular symptoms  Respiratory : Negative for respiratory symptoms  Endocrine: Negative for endocrine symptoms  Musculoskeletal: Negative for musculoskeletal symptoms  Neurological: Negative for neurological symptoms  Psychologic: Negative for psychiatric symptoms

## 2020-08-17 ENCOUNTER — Telehealth: Payer: Self-pay | Admitting: Urology

## 2020-08-17 NOTE — Telephone Encounter (Signed)
Patient called and repots that he was told that you would be sending in 2 prescriptions to [facility] in [place]. I see that you sent in the Flomax for 2 times a day but no other medications mentions in the note. Please advise.

## 2020-08-18 DIAGNOSIS — M25572 Pain in left ankle and joints of left foot: Secondary | ICD-10-CM | POA: Diagnosis not present

## 2020-08-18 DIAGNOSIS — E1142 Type 2 diabetes mellitus with diabetic polyneuropathy: Secondary | ICD-10-CM | POA: Diagnosis not present

## 2020-08-18 DIAGNOSIS — M79672 Pain in left foot: Secondary | ICD-10-CM | POA: Diagnosis not present

## 2020-08-21 DIAGNOSIS — I10 Essential (primary) hypertension: Secondary | ICD-10-CM | POA: Diagnosis not present

## 2020-08-21 DIAGNOSIS — K5790 Diverticulosis of intestine, part unspecified, without perforation or abscess without bleeding: Secondary | ICD-10-CM | POA: Diagnosis not present

## 2020-08-21 DIAGNOSIS — S32009A Unspecified fracture of unspecified lumbar vertebra, initial encounter for closed fracture: Secondary | ICD-10-CM | POA: Diagnosis not present

## 2020-08-21 DIAGNOSIS — C679 Malignant neoplasm of bladder, unspecified: Secondary | ICD-10-CM | POA: Diagnosis not present

## 2020-08-21 DIAGNOSIS — R32 Unspecified urinary incontinence: Secondary | ICD-10-CM | POA: Diagnosis not present

## 2020-08-21 DIAGNOSIS — E1165 Type 2 diabetes mellitus with hyperglycemia: Secondary | ICD-10-CM | POA: Diagnosis not present

## 2020-08-21 DIAGNOSIS — Z86010 Personal history of colonic polyps: Secondary | ICD-10-CM | POA: Diagnosis not present

## 2020-08-21 DIAGNOSIS — E236 Other disorders of pituitary gland: Secondary | ICD-10-CM | POA: Diagnosis not present

## 2020-09-07 ENCOUNTER — Telehealth: Payer: Self-pay

## 2020-09-07 NOTE — Telephone Encounter (Signed)
Ok to refill 

## 2020-09-07 NOTE — Telephone Encounter (Signed)
Patient needing a refill on:  dutasteride (AVODART) 0.5 MG capsule Requesting 90 day supply  Please fill at:   [facility] [address] [place] Phone:  [phone]  Fax:  [phone]     Thanks, Helene Kelp

## 2020-09-08 ENCOUNTER — Other Ambulatory Visit: Payer: Self-pay

## 2020-09-08 DIAGNOSIS — N138 Other obstructive and reflux uropathy: Secondary | ICD-10-CM

## 2020-09-08 MED ORDER — DUTASTERIDE 0.5 MG PO CAPS: 0.5000 mg | ORAL_CAPSULE | Freq: Every day | ORAL | 3 refills | Status: DC

## 2020-09-08 NOTE — Telephone Encounter (Signed)
Rx sent, Patient called with no answer. Message left to call office.

## 2020-09-25 ENCOUNTER — Emergency Department (HOSPITAL_COMMUNITY): Payer: Medicare Other

## 2020-09-25 ENCOUNTER — Inpatient Hospital Stay (HOSPITAL_COMMUNITY)
Admission: EM | Admit: 2020-09-25 | Discharge: 2020-09-27 | DRG: 177 | Disposition: A | Discharge status: Home / Self Care | Payer: Medicare Other | Attending: Internal Medicine | Admitting: Internal Medicine

## 2020-09-25 ENCOUNTER — Encounter (HOSPITAL_COMMUNITY): Payer: Self-pay

## 2020-09-25 ENCOUNTER — Ambulatory Visit: Payer: Medicare Other | Admitting: Urology

## 2020-09-25 DIAGNOSIS — J9601 Acute respiratory failure with hypoxia: Secondary | ICD-10-CM | POA: Diagnosis not present

## 2020-09-25 DIAGNOSIS — J189 Pneumonia, unspecified organism: Secondary | ICD-10-CM | POA: Diagnosis not present

## 2020-09-25 DIAGNOSIS — Z79899 Other long term (current) drug therapy: Secondary | ICD-10-CM

## 2020-09-25 DIAGNOSIS — I10 Essential (primary) hypertension: Secondary | ICD-10-CM | POA: Diagnosis not present

## 2020-09-25 DIAGNOSIS — E7800 Pure hypercholesterolemia, unspecified: Secondary | ICD-10-CM | POA: Diagnosis present

## 2020-09-25 DIAGNOSIS — G9341 Metabolic encephalopathy: Secondary | ICD-10-CM | POA: Diagnosis present

## 2020-09-25 DIAGNOSIS — E869 Volume depletion, unspecified: Secondary | ICD-10-CM | POA: Diagnosis present

## 2020-09-25 DIAGNOSIS — J9600 Acute respiratory failure, unspecified whether with hypoxia or hypercapnia: Secondary | ICD-10-CM

## 2020-09-25 DIAGNOSIS — U071 COVID-19: Secondary | ICD-10-CM | POA: Diagnosis not present

## 2020-09-25 DIAGNOSIS — D352 Benign neoplasm of pituitary gland: Secondary | ICD-10-CM | POA: Diagnosis present

## 2020-09-25 DIAGNOSIS — R0600 Dyspnea, unspecified: Secondary | ICD-10-CM | POA: Diagnosis not present

## 2020-09-25 DIAGNOSIS — Z743 Need for continuous supervision: Secondary | ICD-10-CM | POA: Diagnosis not present

## 2020-09-25 DIAGNOSIS — N400 Enlarged prostate without lower urinary tract symptoms: Secondary | ICD-10-CM | POA: Diagnosis present

## 2020-09-25 DIAGNOSIS — R531 Weakness: Secondary | ICD-10-CM | POA: Diagnosis not present

## 2020-09-25 DIAGNOSIS — R7401 Elevation of levels of liver transaminase levels: Secondary | ICD-10-CM

## 2020-09-25 DIAGNOSIS — D6959 Other secondary thrombocytopenia: Secondary | ICD-10-CM | POA: Diagnosis present

## 2020-09-25 DIAGNOSIS — R9431 Abnormal electrocardiogram [ECG] [EKG]: Secondary | ICD-10-CM

## 2020-09-25 DIAGNOSIS — Z8551 Personal history of malignant neoplasm of bladder: Secondary | ICD-10-CM

## 2020-09-25 DIAGNOSIS — E871 Hypo-osmolality and hyponatremia: Secondary | ICD-10-CM | POA: Diagnosis not present

## 2020-09-25 DIAGNOSIS — G9340 Encephalopathy, unspecified: Secondary | ICD-10-CM | POA: Diagnosis not present

## 2020-09-25 DIAGNOSIS — R0902 Hypoxemia: Secondary | ICD-10-CM | POA: Diagnosis not present

## 2020-09-25 DIAGNOSIS — R509 Fever, unspecified: Secondary | ICD-10-CM | POA: Diagnosis not present

## 2020-09-25 DIAGNOSIS — Z87442 Personal history of urinary calculi: Secondary | ICD-10-CM

## 2020-09-25 DIAGNOSIS — Z83438 Family history of other disorder of lipoprotein metabolism and other lipidemia: Secondary | ICD-10-CM

## 2020-09-25 DIAGNOSIS — T380X5A Adverse effect of glucocorticoids and synthetic analogues, initial encounter: Secondary | ICD-10-CM | POA: Diagnosis not present

## 2020-09-25 DIAGNOSIS — D649 Anemia, unspecified: Secondary | ICD-10-CM | POA: Diagnosis present

## 2020-09-25 DIAGNOSIS — J1282 Pneumonia due to coronavirus disease 2019: Secondary | ICD-10-CM | POA: Diagnosis not present

## 2020-09-25 DIAGNOSIS — R6889 Other general symptoms and signs: Secondary | ICD-10-CM | POA: Diagnosis not present

## 2020-09-25 DIAGNOSIS — I4510 Unspecified right bundle-branch block: Secondary | ICD-10-CM | POA: Diagnosis present

## 2020-09-25 DIAGNOSIS — E1165 Type 2 diabetes mellitus with hyperglycemia: Secondary | ICD-10-CM | POA: Diagnosis not present

## 2020-09-25 DIAGNOSIS — J9811 Atelectasis: Secondary | ICD-10-CM | POA: Diagnosis not present

## 2020-09-25 HISTORY — DX: Pneumonia, unspecified organism: J18.9

## 2020-09-25 LAB — COMPREHENSIVE METABOLIC PANEL WITH GFR
ALT: 57 U/L — ABNORMAL HIGH (ref 0–44)
AST: 76 U/L — ABNORMAL HIGH (ref 15–41)
Albumin: 4.1 g/dL (ref 3.5–5.0)
Alkaline Phosphatase: 40 U/L (ref 38–126)
Anion gap: 9 (ref 5–15)
BUN: 17 mg/dL (ref 8–23)
CO2: 20 mmol/L — ABNORMAL LOW (ref 22–32)
Calcium: 8.4 mg/dL — ABNORMAL LOW (ref 8.9–10.3)
Chloride: 98 mmol/L (ref 98–111)
Creatinine, Ser: 1.26 mg/dL — ABNORMAL HIGH (ref 0.61–1.24)
GFR, Estimated: 59 mL/min — ABNORMAL LOW
Glucose, Bld: 103 mg/dL — ABNORMAL HIGH (ref 70–99)
Potassium: 4 mmol/L (ref 3.5–5.1)
Sodium: 127 mmol/L — ABNORMAL LOW (ref 135–145)
Total Bilirubin: 1.1 mg/dL (ref 0.3–1.2)
Total Protein: 6.8 g/dL (ref 6.5–8.1)

## 2020-09-25 LAB — CBC WITH DIFFERENTIAL/PLATELET
Abs Immature Granulocytes: 0.01 10*3/uL (ref 0.00–0.07)
Basophils Absolute: 0 10*3/uL (ref 0.0–0.1)
Basophils Relative: 0 %
Eosinophils Absolute: 0 10*3/uL (ref 0.0–0.5)
Eosinophils Relative: 0 %
HCT: 38.3 % — ABNORMAL LOW (ref 39.0–52.0)
Hemoglobin: 12.9 g/dL — ABNORMAL LOW (ref 13.0–17.0)
Immature Granulocytes: 0 %
Lymphocytes Relative: 27 %
Lymphs Abs: 1.5 10*3/uL (ref 0.7–4.0)
MCH: 31.1 pg (ref 26.0–34.0)
MCHC: 33.7 g/dL (ref 30.0–36.0)
MCV: 92.3 fL (ref 80.0–100.0)
Monocytes Absolute: 0.4 10*3/uL (ref 0.1–1.0)
Monocytes Relative: 7 %
Neutro Abs: 3.8 10*3/uL (ref 1.7–7.7)
Neutrophils Relative %: 66 %
Platelets: 111 10*3/uL — ABNORMAL LOW (ref 150–400)
RBC: 4.15 MIL/uL — ABNORMAL LOW (ref 4.22–5.81)
RDW: 14.1 % (ref 11.5–15.5)
WBC: 5.7 10*3/uL (ref 4.0–10.5)
nRBC: 0 % (ref 0.0–0.2)

## 2020-09-25 LAB — C-REACTIVE PROTEIN: CRP: 2.5 mg/dL — ABNORMAL HIGH

## 2020-09-25 LAB — LACTATE DEHYDROGENASE: LDH: 204 U/L — ABNORMAL HIGH (ref 98–192)

## 2020-09-25 LAB — FIBRINOGEN: Fibrinogen: 297 mg/dL (ref 210–475)

## 2020-09-25 LAB — D-DIMER, QUANTITATIVE: D-Dimer, Quant: 0.74 ug{FEU}/mL — ABNORMAL HIGH (ref 0.00–0.50)

## 2020-09-25 LAB — PROCALCITONIN: Procalcitonin: <0.1 ng/mL

## 2020-09-25 LAB — TRIGLYCERIDES: Triglycerides: 69 mg/dL

## 2020-09-25 LAB — FERRITIN: Ferritin: 384 ng/mL — ABNORMAL HIGH (ref 24–336)

## 2020-09-25 MED ORDER — SODIUM CHLORIDE 0.9 % IV SOLN
100.0000 mg | Freq: Every day | INTRAVENOUS | Status: DC
  Administered 2020-09-27: 100 mg via INTRAVENOUS
  Filled 2020-09-25 (×2): qty 20

## 2020-09-25 MED ORDER — DEXAMETHASONE SODIUM PHOSPHATE 10 MG/ML IJ SOLN
10.0000 mg | Freq: Once | INTRAMUSCULAR | Status: AC
  Administered 2020-09-25: 10 mg via INTRAVENOUS
  Filled 2020-09-25: qty 1

## 2020-09-25 MED ORDER — SODIUM CHLORIDE 0.9 % IV BOLUS
500.0000 mL | Freq: Once | INTRAVENOUS | Status: AC
  Administered 2020-09-25: 500 mL via INTRAVENOUS

## 2020-09-25 MED ORDER — SODIUM CHLORIDE 0.9 % IV SOLN
100.0000 mg | INTRAVENOUS | Status: AC
  Administered 2020-09-26 (×2): 100 mg via INTRAVENOUS
  Filled 2020-09-25: qty 20

## 2020-09-25 NOTE — ED Provider Notes (Signed)
[facility] Provider Note   CSN: [id number] Arrival date & time: 09/25/20  2055     History Chief Complaint  Patient presents with   Weakness    Covid + home test    Eric [name] is a 75 y.o. male.  Patient with a history of diabetes, hypertension, high cholesterol here with generalized weakness and positive COVID test at home.  Unclear when he was found to be COVID-positive. Nursing staff reports that this was yesterday. Patient is oriented to person place and time.  He states he is here because his family was concerned about him being generally weak.  He denies any difficulty breathing, chest pain, abdominal pain, nausea, vomiting, cough or fever.  To be febrile on ED arrival.  He states he is COVID-positive but does not know when he had a positive test.  States he does not feel sick.  He denies any headache, difficulty breathing, chest pain, abdominal pain, nausea, vomiting or diarrhea.  No pain with urination or blood in the urine.  No sick contacts at home.  The history is provided by the patient.  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    Patient Active Problem List   Diagnosis Date Noted   Pituitary mass ([place]) 04/24/2020   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   Hemorrhoids 04/20/2020   Constipation 04/20/2020   Essential hypertension, benign 06/29/2016   High cholesterol 06/29/2016   Bladder cancer ([place]) 06/29/2016   Diabetes ([place]) 06/29/2016   Guaiac positive stools 06/29/2016    Past Surgical History:  Procedure  Laterality Date   BIOPSY  09/29/2016   Procedure: BIOPSY;  Surgeon: Rogene Houston, MD;  [facility];  Service: Endoscopy;;  colon   BIOPSY  11/14/2019   Procedure: BIOPSY;  Surgeon: Rogene Houston, MD;  [facility];  Service: Endoscopy;;   BLADDER SURGERY     2013   CHOLECYSTECTOMY     COLONOSCOPY N/A 09/29/2016   Procedure: COLONOSCOPY;  Surgeon: Rogene Houston, MD;  [facility];  Service: Endoscopy;  Laterality: N/A;  12:00   COLONOSCOPY N/A 11/14/2019   Procedure: COLONOSCOPY;  Surgeon: Rogene Houston, MD;  [facility];  Service: Endoscopy;  Laterality: N/A;  125, per office, pt knows new arrival time   CRANIOTOMY N/A 04/24/2020   Procedure: 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;  Surgeon: Ashok Pall, MD;  Location: [place];  Service: Neurosurgery;  Laterality: N/A;  3C/RM 20   ESOPHAGOGASTRODUODENOSCOPY N/A 11/14/2019   Procedure: ESOPHAGOGASTRODUODENOSCOPY (EGD);  Surgeon: Rogene Houston, MD;  [facility];  Service: Endoscopy;  Laterality: N/A;   knee athroscopy     TONSILLECTOMY     as child   TRANSPHENOIDAL APPROACH EXPOSURE N/A 04/24/2020   Procedure: TRANSPHENOIDAL APPROACH EXPOSURE;  Surgeon: Jerrell Belfast, MD;  Location: [place];  Service: ENT;  Laterality: N/A;       Family History  Problem Relation Age of Onset   Hypercholesterolemia Father  Hypercholesterolemia Mother    Colon cancer Neg Hx    Urolithiasis Neg Hx    Sudden death Neg Hx    Lupus Neg Hx    Sickle cell trait Neg Hx     Social History   Tobacco Use   Smoking status: Never   Smokeless tobacco: Never  Vaping Use   Vaping Use: Never used  Substance Use Topics   Alcohol use: Not Currently   Drug use: No    Home Medications Prior to Admission medications   Medication Sig Start Date End Date Taking? Authorizing Provider  amLODipine (NORVASC) 5 MG tablet Take 5 mg by mouth daily.    [provider]  Ascorbic Acid (VITAMIN C) 1000 MG tablet Take 1,000 mg by mouth in the morning and at bedtime.    [provider]  B Complex-C (B-COMPLEX WITH VITAMIN C) tablet Take 1 tablet by mouth daily.    [provider]  Cholecalciferol (VITAMIN D3) 50 MCG (2000 UT) TABS Take 2,000 Units by mouth daily.    [provider]  Coenzyme Q10 (COQ10) 100 MG CAPS Take 100 mg by mouth daily.    [provider]  dutasteride (AVODART) 0.5 MG capsule Take 1 capsule (0.5 mg total) by mouth daily. 09/08/20   McKenzie, Candee Furbish, MD  gabapentin (NEURONTIN) 300 MG capsule Take 300 mg by mouth in the morning, at noon, and at bedtime.  05/09/19   [provider]  HYDROcodone-acetaminophen (NORCO/VICODIN) 5-325 MG tablet Take 1 tablet by mouth every 4 (four) hours as needed for moderate pain. 04/26/20 04/26/21  Costella, Vista Mink, PA-C  lisinopril (PRINIVIL,ZESTRIL) 20 MG tablet Take 20 mg by mouth daily.    [provider]  Magnesium 400 MG TABS Take 400 mg by mouth daily.    [provider]  Misc Natural Products (BLOOD SUGAR BALANCE) TABS Take 1 tablet by mouth in the morning and at bedtime. Glucocil    [provider]  Multiple Vitamin (MULTIVITAMIN WITH MINERALS) TABS tablet Take 1 tablet by mouth daily.    [provider]  Potassium 99 MG TABS Take 99 mg by mouth daily.    [provider]  psyllium (METAMUCIL) 58.6 % powder Take 1 packet by mouth daily.    [provider]  tamsulosin (FLOMAX) 0.4 MG CAPS capsule Take 1 capsule (0.4 mg total) by mouth in the morning and at bedtime. 08/12/20   McKenzie, Candee Furbish, MD  temazepam (RESTORIL) 15 MG capsule Take 15 mg by mouth at bedtime as needed for sleep. 07/25/19   [provider]  Zinc 50 MG TABS Take 50 mg by mouth daily.    [provider]    Allergies    Patient has no known allergies.  Review of Systems   Review of Systems   Constitutional:  Positive for activity change, appetite change, fatigue and fever.  HENT:  Negative for congestion and rhinorrhea.   Respiratory:  Negative for cough, chest tightness and shortness of breath.   Cardiovascular:  Negative for chest pain and leg swelling.  Gastrointestinal:  Negative for abdominal pain, nausea and vomiting.  Genitourinary:  Negative for dysuria and hematuria.  Musculoskeletal:  Positive for arthralgias and myalgias.  Skin:  Negative for rash.  Neurological:  Positive for weakness. Negative for dizziness, light-headedness and headaches.   all other systems are negative except as noted in the HPI and PMH.   Physical Exam Updated Vital Signs BP 129/63   Pulse  93   Temp (!) 101.3 F (38.5 C) (Oral)   Resp 19   Ht 5\' 10"  (1.778 m)   Wt 78.9 kg   SpO2 95%   BMI 24.97 kg/m   Physical Exam Vitals and nursing note reviewed.  Constitutional:      General: He is not in acute distress.    Appearance: He is well-developed.     Comments: Oriented to person place and time  HENT:     Head: Normocephalic and atraumatic.     Mouth/Throat:     Pharynx: No oropharyngeal exudate.  Eyes:     Conjunctiva/sclera: Conjunctivae normal.     Pupils: Pupils are equal, round, and reactive to light.  Neck:     Comments: No meningismus. Cardiovascular:     Rate and Rhythm: Normal rate and regular rhythm.     Heart sounds: Normal heart sounds. No murmur heard. Pulmonary:     Effort: Pulmonary effort is normal. No respiratory distress.     Breath sounds: Normal breath sounds. No wheezing.  Chest:     Chest wall: No tenderness.  Abdominal:     Palpations: Abdomen is soft.     Tenderness: There is no abdominal tenderness. There is no guarding or rebound.  Musculoskeletal:        General: No tenderness. Normal range of motion.     Cervical back: Normal range of motion and neck supple.  Skin:    General: Skin is warm.  Neurological:     General: No focal deficit  present.     Mental Status: He is alert and oriented to person, place, and time. Mental status is at baseline.     Cranial Nerves: No cranial nerve deficit.     Motor: No abnormal muscle tone.     Coordination: Coordination normal.     Comments: No ataxia on finger to nose bilaterally. No pronator drift. 5/5 strength throughout. CN 2-12 intact.Equal grip strength. Sensation intact.   Psychiatric:        Behavior: Behavior normal.    ED Results / Procedures / Treatments   Labs (all labs ordered are listed, but only abnormal results are displayed) Labs Reviewed  RESP PANEL BY RT-PCR (FLU A&B, COVID) ARPGX2 - Abnormal; Notable for the following components:      Result Value   SARS Coronavirus 2 by RT PCR POSITIVE (*)    All other components within normal limits  CBC WITH DIFFERENTIAL/PLATELET - Abnormal; Notable for the following components:   RBC 4.15 (*)    Hemoglobin 12.9 (*)    HCT 38.3 (*)    Platelets 111 (*)    All other components within normal limits  COMPREHENSIVE METABOLIC PANEL - Abnormal; Notable for the following components:   Sodium 127 (*)    CO2 20 (*)    Glucose, Bld 103 (*)    Creatinine, Ser 1.26 (*)    Calcium 8.4 (*)    AST 76 (*)    ALT 57 (*)    GFR, Estimated 59 (*)    All other components within normal limits  D-DIMER, QUANTITATIVE - Abnormal; Notable for the following components:   D-Dimer, Quant 0.74 (*)    All other components within normal limits  LACTATE DEHYDROGENASE - Abnormal; Notable for the following components:   LDH 204 (*)    All other components within normal limits  FERRITIN - Abnormal; Notable for the following components:   Ferritin 384 (*)    All other components within  normal limits  C-REACTIVE PROTEIN - Abnormal; Notable for the following components:   CRP 2.5 (*)    All other components within normal limits  CULTURE, BLOOD (ROUTINE X 2)  CULTURE, BLOOD (ROUTINE X 2)  LACTIC ACID, PLASMA  LACTIC ACID, PLASMA  PROCALCITONIN   TRIGLYCERIDES  FIBRINOGEN  COMPREHENSIVE METABOLIC PANEL  CBC  PROTIME-INR  APTT  MAGNESIUM  PHOSPHORUS  TROPONIN I (HIGH SENSITIVITY)  TROPONIN I (HIGH SENSITIVITY)    EKG EKG Interpretation  Date/Time:  Friday September 25 2020 21:06:04 EDT Ventricular Rate:  93 PR Interval:  139 QRS Duration: 169 QT Interval:  453 QTC Calculation: 564 R Axis:   264 Text Interpretation: Sinus rhythm RBBB and LAFB No significant change since last tracing Confirmed by Calvert Cantor [phone]) on 09/25/2020 9:14:20 PM  Radiology DG Chest Port 1 View  Result Date: 09/25/2020 CLINICAL DATA:  Dyspnea, COVID pneumonia EXAM: PORTABLE CHEST 1 VIEW COMPARISON:  None. FINDINGS: Lung volumes are small, but are symmetric. Minimal left basilar atelectasis. Lungs are otherwise clear. No pneumothorax or pleural effusion. Cardiac size within normal limits. Pulmonary vascularity is normal. No acute bone abnormality. IMPRESSION: No active disease. Electronically Signed   By: Fidela Salisbury MD   On: 09/25/2020 22:57    Procedures Procedures   Medications Ordered in ED Medications - No data to display  ED Course  I have reviewed the triage vital signs and the nursing notes.  Pertinent labs & imaging results that were available during my care of the patient were reviewed by me and considered in my medical decision making (see chart for details).    MDM Rules/Calculators/A&P                         Patient from home with generalized weakness and reported positive COVID test.  Unclear when he was positive.  He denies any complaints.  No increased work of breathing or hypoxia.  From 12 markers are mildly elevated.  Sodium 127. Chest x-ray is negative.  No hypoxia or increased work of breathing.  Patient hydrated gently.  EKG is nonischemic and troponins remain negative.  Patient remains generally weak and unable to ambulate.  Does appear to be confused per family though he is oriented x3 here.  He is febrile  in the ED but has no meningismus. CT head will be obtained given his history of pituitary mass.  Borderline O2 saturations of 90% on arrival.  He is initiated on nasal cannula oxygen as well as IV steroids and IV remdesivir.  Admission discussed with Dr. Josephine Cables.  Sharon Stapel was evaluated in Emergency Department on 09/25/2020 for the symptoms described in the history of present illness. He was evaluated in the context of the global COVID-19 pandemic, which necessitated consideration that the patient might be at risk for infection with the SARS-CoV-2 virus that causes COVID-19. Institutional protocols and algorithms that pertain to the evaluation of patients at risk for COVID-19 are in a state of rapid change based on information released by regulatory bodies including the CDC and federal and state organizations. These policies and algorithms were followed during the patient's care in the ED.  Final Clinical Impression(s) / ED Diagnoses Final diagnoses:  Acute encephalopathy  COVID-19  Generalized weakness    Rx / DC Orders ED Discharge Orders     None        Suzzanne Brunkhorst, Annie Main, MD 09/26/20 [phone]

## 2020-09-25 NOTE — ED Triage Notes (Signed)
Pt BIBA for gen weakness s/p + Covid.

## 2020-09-26 ENCOUNTER — Other Ambulatory Visit: Payer: Self-pay

## 2020-09-26 ENCOUNTER — Observation Stay (HOSPITAL_COMMUNITY): Payer: Medicare Other

## 2020-09-26 DIAGNOSIS — I6200 Nontraumatic subdural hemorrhage, unspecified: Secondary | ICD-10-CM | POA: Diagnosis not present

## 2020-09-26 DIAGNOSIS — I4510 Unspecified right bundle-branch block: Secondary | ICD-10-CM | POA: Diagnosis present

## 2020-09-26 DIAGNOSIS — I10 Essential (primary) hypertension: Secondary | ICD-10-CM | POA: Diagnosis present

## 2020-09-26 DIAGNOSIS — Z79899 Other long term (current) drug therapy: Secondary | ICD-10-CM | POA: Diagnosis not present

## 2020-09-26 DIAGNOSIS — E1165 Type 2 diabetes mellitus with hyperglycemia: Secondary | ICD-10-CM | POA: Diagnosis not present

## 2020-09-26 DIAGNOSIS — R410 Disorientation, unspecified: Secondary | ICD-10-CM | POA: Diagnosis not present

## 2020-09-26 DIAGNOSIS — N400 Enlarged prostate without lower urinary tract symptoms: Secondary | ICD-10-CM

## 2020-09-26 DIAGNOSIS — J9600 Acute respiratory failure, unspecified whether with hypoxia or hypercapnia: Secondary | ICD-10-CM | POA: Diagnosis not present

## 2020-09-26 DIAGNOSIS — U071 COVID-19: Secondary | ICD-10-CM | POA: Diagnosis present

## 2020-09-26 DIAGNOSIS — G9341 Metabolic encephalopathy: Secondary | ICD-10-CM

## 2020-09-26 DIAGNOSIS — J9601 Acute respiratory failure with hypoxia: Secondary | ICD-10-CM | POA: Diagnosis present

## 2020-09-26 DIAGNOSIS — R7401 Elevation of levels of liver transaminase levels: Secondary | ICD-10-CM

## 2020-09-26 DIAGNOSIS — D649 Anemia, unspecified: Secondary | ICD-10-CM | POA: Diagnosis present

## 2020-09-26 DIAGNOSIS — E871 Hypo-osmolality and hyponatremia: Secondary | ICD-10-CM

## 2020-09-26 DIAGNOSIS — R9431 Abnormal electrocardiogram [ECG] [EKG]: Secondary | ICD-10-CM

## 2020-09-26 DIAGNOSIS — T380X5A Adverse effect of glucocorticoids and synthetic analogues, initial encounter: Secondary | ICD-10-CM | POA: Diagnosis not present

## 2020-09-26 DIAGNOSIS — R531 Weakness: Secondary | ICD-10-CM | POA: Diagnosis not present

## 2020-09-26 DIAGNOSIS — E869 Volume depletion, unspecified: Secondary | ICD-10-CM | POA: Diagnosis present

## 2020-09-26 DIAGNOSIS — Z8551 Personal history of malignant neoplasm of bladder: Secondary | ICD-10-CM | POA: Diagnosis not present

## 2020-09-26 DIAGNOSIS — E7800 Pure hypercholesterolemia, unspecified: Secondary | ICD-10-CM | POA: Diagnosis present

## 2020-09-26 DIAGNOSIS — D352 Benign neoplasm of pituitary gland: Secondary | ICD-10-CM | POA: Diagnosis present

## 2020-09-26 DIAGNOSIS — G9340 Encephalopathy, unspecified: Secondary | ICD-10-CM | POA: Diagnosis not present

## 2020-09-26 DIAGNOSIS — D6959 Other secondary thrombocytopenia: Secondary | ICD-10-CM | POA: Diagnosis present

## 2020-09-26 DIAGNOSIS — Z83438 Family history of other disorder of lipoprotein metabolism and other lipidemia: Secondary | ICD-10-CM | POA: Diagnosis not present

## 2020-09-26 DIAGNOSIS — Z87442 Personal history of urinary calculi: Secondary | ICD-10-CM | POA: Diagnosis not present

## 2020-09-26 DIAGNOSIS — J1282 Pneumonia due to coronavirus disease 2019: Secondary | ICD-10-CM | POA: Diagnosis present

## 2020-09-26 LAB — HEMOGLOBIN A1C
Hgb A1c MFr Bld: 6.5 % — ABNORMAL HIGH (ref 4.8–5.6)
Mean Plasma Glucose: 139.85 mg/dL

## 2020-09-26 LAB — URINALYSIS, COMPLETE (UACMP) WITH MICROSCOPIC
Bacteria, UA: NONE SEEN
Bilirubin Urine: NEGATIVE
Glucose, UA: NEGATIVE mg/dL
Ketones, ur: NEGATIVE mg/dL
Leukocytes,Ua: NEGATIVE
Nitrite: NEGATIVE
Protein, ur: NEGATIVE mg/dL
Specific Gravity, Urine: 1.005 (ref 1.005–1.030)
pH: 6 (ref 5.0–8.0)

## 2020-09-26 LAB — MAGNESIUM: Magnesium: 2.1 mg/dL (ref 1.7–2.4)

## 2020-09-26 LAB — PHOSPHORUS: Phosphorus: 3.7 mg/dL (ref 2.5–4.6)

## 2020-09-26 LAB — RESP PANEL BY RT-PCR (FLU A&B, COVID) ARPGX2
Influenza A by PCR: NEGATIVE
Influenza B by PCR: NEGATIVE
SARS Coronavirus 2 by RT PCR: POSITIVE — AB

## 2020-09-26 LAB — CBC
HCT: 40.2 % (ref 39.0–52.0)
Hemoglobin: 13.1 g/dL (ref 13.0–17.0)
MCH: 31 pg (ref 26.0–34.0)
MCHC: 32.6 g/dL (ref 30.0–36.0)
MCV: 95 fL (ref 80.0–100.0)
Platelets: 105 10*3/uL — ABNORMAL LOW (ref 150–400)
RBC: 4.23 MIL/uL (ref 4.22–5.81)
RDW: 14.1 % (ref 11.5–15.5)
WBC: 9.5 10*3/uL (ref 4.0–10.5)
nRBC: 0 % (ref 0.0–0.2)

## 2020-09-26 LAB — APTT: aPTT: 36 seconds (ref 24–36)

## 2020-09-26 LAB — COMPREHENSIVE METABOLIC PANEL
ALT: 58 U/L — ABNORMAL HIGH (ref 0–44)
AST: 80 U/L — ABNORMAL HIGH (ref 15–41)
Albumin: 3.8 g/dL (ref 3.5–5.0)
Alkaline Phosphatase: 35 U/L — ABNORMAL LOW (ref 38–126)
Anion gap: 9 (ref 5–15)
BUN: 18 mg/dL (ref 8–23)
CO2: 19 mmol/L — ABNORMAL LOW (ref 22–32)
Calcium: 8.4 mg/dL — ABNORMAL LOW (ref 8.9–10.3)
Chloride: 101 mmol/L (ref 98–111)
Creatinine, Ser: 1.33 mg/dL — ABNORMAL HIGH (ref 0.61–1.24)
GFR, Estimated: 55 mL/min — ABNORMAL LOW (ref >60)
Glucose, Bld: 204 mg/dL — ABNORMAL HIGH (ref 70–99)
Potassium: 3.7 mmol/L (ref 3.5–5.1)
Sodium: 129 mmol/L — ABNORMAL LOW (ref 135–145)
Total Bilirubin: 0.9 mg/dL (ref 0.3–1.2)
Total Protein: 6.6 g/dL (ref 6.5–8.1)

## 2020-09-26 LAB — VITAMIN B12: Vitamin B-12: 925 pg/mL — ABNORMAL HIGH (ref 180–914)

## 2020-09-26 LAB — LACTIC ACID, PLASMA
Lactic Acid, Venous: 1.4 mmol/L (ref 0.5–1.9)
Lactic Acid, Venous: 1.4 mmol/L (ref 0.5–1.9)

## 2020-09-26 LAB — GLUCOSE, CAPILLARY
Glucose-Capillary: 287 mg/dL — ABNORMAL HIGH (ref 70–99)
Glucose-Capillary: 201 mg/dL — ABNORMAL HIGH (ref 70–99)
Glucose-Capillary: 163 mg/dL — ABNORMAL HIGH (ref 70–99)

## 2020-09-26 LAB — PROTIME-INR
INR: 1.3 — ABNORMAL HIGH (ref 0.8–1.2)
Prothrombin Time: 15.9 seconds — ABNORMAL HIGH (ref 11.4–15.2)

## 2020-09-26 LAB — FOLATE: Folate: 59.7 ng/mL (ref >5.9)

## 2020-09-26 LAB — PROCALCITONIN: Procalcitonin: 1.05 ng/mL

## 2020-09-26 LAB — TSH: TSH: 0.213 u[IU]/mL — ABNORMAL LOW (ref 0.350–4.500)

## 2020-09-26 LAB — TROPONIN I (HIGH SENSITIVITY)
Troponin I (High Sensitivity): 6 ng/L (ref <18)
Troponin I (High Sensitivity): 6 ng/L (ref <18)

## 2020-09-26 MED ORDER — ACETAMINOPHEN 500 MG PO TABS
1000.0000 mg | ORAL_TABLET | Freq: Once | ORAL | Status: AC
  Administered 2020-09-26: 1000 mg via ORAL
  Filled 2020-09-26: qty 2

## 2020-09-26 MED ORDER — INSULIN ASPART 100 UNIT/ML IJ SOLN: 0.0000 [IU] | Freq: Every day | INTRAMUSCULAR | Status: DC

## 2020-09-26 MED ORDER — METHYLPREDNISOLONE SODIUM SUCC 125 MG IJ SOLR
60.0000 mg | Freq: Two times a day (BID) | INTRAMUSCULAR | Status: DC
  Administered 2020-09-26 – 2020-09-27 (×3): 60 mg via INTRAVENOUS
  Filled 2020-09-26 (×3): qty 2

## 2020-09-26 MED ORDER — DOXYCYCLINE HYCLATE 100 MG PO TABS
100.0000 mg | ORAL_TABLET | Freq: Two times a day (BID) | ORAL | Status: DC
  Administered 2020-09-26 – 2020-09-27 (×3): 100 mg via ORAL
  Filled 2020-09-26 (×3): qty 1

## 2020-09-26 MED ORDER — POTASSIUM CHLORIDE IN NACL 20-0.9 MEQ/L-% IV SOLN: INTRAVENOUS | Status: DC

## 2020-09-26 MED ORDER — INSULIN ASPART 100 UNIT/ML IJ SOLN
0.0000 [IU] | Freq: Three times a day (TID) | INTRAMUSCULAR | Status: DC
  Administered 2020-09-26: 5 [IU] via SUBCUTANEOUS
  Administered 2020-09-26: 8 [IU] via SUBCUTANEOUS
  Administered 2020-09-27: 3 [IU] via SUBCUTANEOUS
  Administered 2020-09-27: 5 [IU] via SUBCUTANEOUS

## 2020-09-26 MED ORDER — SODIUM CHLORIDE 0.9 % IV SOLN: Freq: Once | INTRAVENOUS | Status: AC

## 2020-09-26 MED ORDER — ENOXAPARIN SODIUM 40 MG/0.4ML IJ SOSY
40.0000 mg | PREFILLED_SYRINGE | INTRAMUSCULAR | Status: DC
  Administered 2020-09-26 – 2020-09-27 (×2): 40 mg via SUBCUTANEOUS
  Filled 2020-09-26 (×2): qty 0.4

## 2020-09-26 MED ORDER — SODIUM CHLORIDE 0.9 % IV SOLN
1.0000 g | INTRAVENOUS | Status: DC
  Administered 2020-09-26 – 2020-09-27 (×2): 1 g via INTRAVENOUS
  Filled 2020-09-26 (×2): qty 10

## 2020-09-26 NOTE — Progress Notes (Signed)
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  The patient's spouse was positive for COVID.  As result, the decision was made to check the patient for COVID because of his worsening generalized weakness.  The patient also been complaining of a nonproductive cough and dizziness.  He had 1 episode of nausea and vomiting on 09/25/2020.  There is no diarrhea, abdominal pain, dysuria, hematuria.  There is no hemoptysis.  He denied any chest pain, shortness of breath, headache, neck pain. In the emergency department, the patient was febrile up to 103.2 F.  He has been hemodynamically stable.  Oxygen saturation was 90% on room air.  The patient was placed on supplemental oxygen and started on remdesivir and steroids.  Assessment/Plan: Acute respiratory failure with hypoxia secondary to COVID-19 pneumonia -CRP 2.5>> -Ferritin 384>> -D-dimer 0.74>> -Continue IV Solu-Medrol -Continue remdesivir -PCT 1.05 -Start empiric azithromycin and ceftriaxone -Personally reviewed chest x-ray--increased interstitial markings -Personally reviewed EKG sinus rhythm, RBBB  Thrombocytopenia -Secondary to COVID-19 infection -Check serum G86 -Folic acid -TSH  Acute metabolic encephalopathy -Secondary to COVID-19 infection -Obtain UA  Essential hypertension -Holding amlodipine and lisinopril secondary to soft blood pressure  BPH -Continue tamsulosin  Hyperglycemia -Check hemoglobin A1c -CBGs elevated secondary to steroids  Transaminasemia -due to COVID-19 -no abd pain -monitor serially  Hyponatremia -due to volume depletion and poor solute intake -continue  NS    Status is: Observation  The patient will require care spanning > 2 midnights and should be moved to inpatient because: Altered mental status and IV treatments appropriate due to intensity of illness or inability to take PO  Dispo: The patient is from: Home              Anticipated d/c is to: Home              Patient currently is not medically stable to d/c.   Difficult to place patient No    Total time spent 35 minutes.  Greater than 50% spent face to face counseling and coordinating care.    Family Communication:   spouse updated 7/16  Consultants:  none  Code Status:  FULL   DVT Prophylaxis:  [place] Lovenox   Procedures: As Listed in Progress Note Above  Antibiotics: Ceftriaxone 7/16>> Azithro 7/16>>      Subjective: Patient denies fevers, chills, headache, chest pain, dyspnea, nausea, vomiting, diarrhea, abdominal pain, dysuria, hematuria, hematochezia, and melena.   Objective: Vitals:   09/26/20 0145 09/26/20 0246 09/26/20 0257 09/26/20 0355  BP:   (!) 114/49 (!) 110/45  Pulse: 93  70 66  Resp: (!) 29  (!) 22   Temp:   99 F (37.2 C) 98.9 F (37.2 C)  TempSrc:    Oral  SpO2: 95% 95% 98% 99%  Weight:   79.8 kg   Height:   5\' 10"  (1.778 m)     Intake/Output Summary (Last 24 hours) at 09/26/2020 0745 Last data filed at 09/26/2020 0644 Gross per 24 hour  Intake 320 ml  Output --  Net 320 ml   Weight change:  Exam:  General:  Pt is alert, follows commands appropriately, not in acute distress HEENT: No icterus, No thrush,  No neck mass, [place]/AT Cardiovascular: RRR, S1/S2, no rubs, no gallops Respiratory: R basilar rales. No wheeze Abdomen: Soft/+BS, non tender, non distended, no guarding Extremities: No edema, No lymphangitis, No petechiae, No rashes, no synovitis   Data Reviewed: I have personally reviewed following labs and imaging studies Basic Metabolic Panel: Recent Labs  Lab 09/25/20 2216 09/26/20 0556  NA 127* 129*  K 4.0 3.7  CL  98 101  CO2 20* 19*  GLUCOSE 103* 204*  BUN 17 18  CREATININE 1.26* 1.33*  CALCIUM 8.4* 8.4*  MG  --  2.1  PHOS  --  3.7   Liver Function Tests: Recent Labs  Lab 09/25/20 2216 09/26/20 0556  AST 76* 80*  ALT 57* 58*  ALKPHOS 40 35*  BILITOT 1.1 0.9  PROT 6.8 6.6  ALBUMIN 4.1 3.8   No results for input(s): LIPASE, AMYLASE in the last 168 hours. No results for input(s): AMMONIA in the last 168 hours. Coagulation Profile: Recent Labs  Lab 09/26/20 0556  INR 1.3*   CBC: Recent Labs  Lab 09/25/20 2216 09/26/20 0556  WBC 5.7 9.5  NEUTROABS 3.8  --   HGB 12.9* 13.1  HCT 38.3* 40.2  MCV 92.3 95.0  PLT 111* 105*   Cardiac Enzymes: No results for input(s): CKTOTAL, CKMB, CKMBINDEX, TROPONINI in the last 168 hours. BNP: Invalid input(s): POCBNP CBG: No results for input(s): GLUCAP in the last 168 hours. HbA1C: No results for input(s): HGBA1C in the last 72 hours. Urine analysis:    Component Value Date/Time   APPEARANCEUR Clear 08/12/2020 1444   GLUCOSEU Negative 08/12/2020 1444   BILIRUBINUR Negative 08/12/2020 1444   PROTEINUR Negative 08/12/2020 1444   NITRITE Negative 08/12/2020 1444   LEUKOCYTESUR Negative 08/12/2020 1444   Sepsis Labs: @LABRCNTIP (procalcitonin:4,lacticidven:4) ) Recent Results (from the past 240 hour(s))  Resp Panel by RT-PCR (Flu A&B, Covid) Nasopharyngeal Swab     Status: Abnormal   Collection Time: 09/25/20 11:28 PM   Specimen: Nasopharyngeal Swab; Nasopharyngeal(NP) swabs in vial transport medium  Result Value Ref Range Status   SARS Coronavirus 2 by RT PCR POSITIVE (A) NEGATIVE Final    Comment: CRITICAL RESULT CALLED TO, READ BACK BY AND VERIFIED WITH: DELL,T 0050 09/26/2020 COLEMAN,R (NOTE) SARS-CoV-2 target nucleic acids are DETECTED.  The SARS-CoV-2 RNA is generally detectable in upper respiratory specimens during the acute phase of infection. Positive results are indicative of the presence of the identified virus, but do  not rule out bacterial infection or co-infection with other pathogens not detected by the test. Clinical correlation with patient history and other diagnostic information is necessary to determine patient infection status. The expected result is Negative.  Fact Sheet for Patients: EntrepreneurPulse.com.au  Fact Sheet for Healthcare Providers: IncredibleEmployment.be  This test is not yet approved or cleared by the [place] FDA and  has been authorized for detection and/or diagnosis of SARS-CoV-2 by FDA under an Emergency Use Authorization (EUA).  This EUA will remain in effect (meaning this tes t can be used) for the duration of  the COVID-19 declaration under Section 564(b)(1) of the Act, 21 U.S.C. section 360bbb-3(b)(1), unless the authorization is terminated or revoked sooner.     Influenza A by PCR NEGATIVE NEGATIVE Final   Influenza B by PCR NEGATIVE NEGATIVE Final    Comment: (NOTE) The Xpert Xpress SARS-CoV-2/FLU/RSV plus assay is intended as an aid in the diagnosis of influenza from Nasopharyngeal swab specimens and should not be used as a sole basis for treatment. Nasal  washings and aspirates are unacceptable for Xpert Xpress SARS-CoV-2/FLU/RSV testing.  Fact Sheet for Patients: EntrepreneurPulse.com.au  Fact Sheet for Healthcare Providers: IncredibleEmployment.be  This test is not yet approved or cleared by the [place] FDA and has been authorized for detection and/or diagnosis of SARS-CoV-2 by FDA under an Emergency Use Authorization (EUA). This EUA will remain in effect (meaning this test can be used) for the duration of the COVID-19 declaration under Section 564(b)(1) of the Act, 21 U.S.C. section 360bbb-3(b)(1), unless the authorization is terminated or revoked.  Performed at [facility], [address], [place], [place] [address]      Scheduled Meds:  enoxaparin (LOVENOX)  injection  40 mg Subcutaneous Q24H   Continuous Infusions:  [START ON 09/27/2020] remdesivir 100 mg in NS 100 mL      Procedures/Studies: DG Chest Port 1 View  Result Date: 09/25/2020 CLINICAL DATA:  Dyspnea, COVID pneumonia EXAM: PORTABLE CHEST 1 VIEW COMPARISON:  None. FINDINGS: Lung volumes are small, but are symmetric. Minimal left basilar atelectasis. Lungs are otherwise clear. No pneumothorax or pleural effusion. Cardiac size within normal limits. Pulmonary vascularity is normal. No acute bone abnormality. IMPRESSION: No active disease. Electronically Signed   By: Fidela Salisbury MD   On: 09/25/2020 22:57    Orson Eva, DO  [facility]  If 7PM-7AM, please contact night-coverage [email or web address] Password [email or web address] 09/26/2020, 7:45 AM   LOS: 0 days

## 2020-09-26 NOTE — Evaluation (Signed)
hysical Therapy Evaluation Patient Details Name: Kamden Stanislaw MRN: [id number] DOB: 11/19/1945 Today's Date: 09/26/2020   History of Present Illness  Kareem Cathey is a 75 y.o. male with medical history significant for hypertension, BPH who presents to the emergency department due to generalized weakness and positive COVID test at home.  Patient states that his family wanted him to go to the ED due to generalized weakness.  Patient appears to have had sick contact at home and it was unknown when exactly he tested positive for COVID.  He denies chest pain, shortness of breath, nausea, vomiting or abdominal pain  Clinical Impression  PT is able to complete bed mobility and household ambulation with mod I  with RW.     Follow Up Recommendations No PT follow up    Equipment Recommendations       Recommendations for Other Services       Precautions / Restrictions        Mobility  Bed Mobility Overal bed mobility: Modified Independent                  Transfers Overall transfer level: Modified independent Equipment used: Rolling walker (2 wheeled)             General transfer comment: PT able to stand at sink and brush his teeth at end of ambulation  Ambulation/Gait Ambulation/Gait assistance: Modified independent (Device/Increase time) Gait Distance (Feet): 50 Feet Assistive device: Rolling walker (2 wheeled) Gait Pattern/deviations: WFL(Within Functional Limits)   Gait velocity interpretation: 1.31 - 2.62 ft/sec, indicative of limited community Conservation officer, historic buildings [place] (Stroke Patients Only)       Balance                                             Pertinent Vitals/Pain Pain Assessment: No/denies pain    Home Living Family/patient expects to be discharged to:: Shelter/Homeless Living Arrangements: Spouse/significant other                    Prior  Function Level of Independence: Independent               Hand Dominance        Extremity/Trunk Assessment        Lower Extremity Assessment Lower Extremity Assessment: Overall WFL for tasks assessed       Communication   Communication: No difficulties            Assessment/Plan    PT Assessment Patent does not need any further PT services  PT Problem List         PT Treatment Interventions      PT Goals (Current goals can be found in the Care Plan section)  Acute Rehab PT Goals Patient Stated Goal: to go home PT Goal Formulation: With patient    Frequency     Barriers to discharge    none       AM-PAC PT "6 Clicks" Mobility  Outcome Measure Help needed turning from your back to your side while in a flat bed without using bedrails?: None Help needed moving from lying on your back to sitting on the side of a flat bed without using bedrails?: None Help needed moving to and from  a bed to a chair (including a wheelchair)?: None Help needed standing up from a chair using your arms (e.g., wheelchair or bedside chair)?: None Help needed to walk in hospital room?: None Help needed climbing 3-5 steps with a railing? : A Little 6 Click Score: 23    End of Session Equipment Utilized During Treatment: Gait belt Activity Tolerance: Patient tolerated treatment well Patient left: in bed;with call bell/phone within reach;with family/visitor present Nurse Communication: Mobility status PT Visit Diagnosis: Muscle weakness (generalized) (M62.81)    Time: 6659-9357 PT Time Calculation (min) (ACUTE ONLY): 30 min   Charges:   PT Evaluation $PT Eval Low Complexity: [place], PT CLT [phone]  09/26/2020, 12:39 PM

## 2020-09-26 NOTE — Progress Notes (Signed)
Pt has had a good day. States feels so much better than yesterday. A&O, mentation clear and appropriate conversations. Pt has been up OOB to restroom x4 today with small to medium type 5 stools each time. Pt sat up in recliner for 2 hours this am, tolerated well.  Pt's son-in-law here earlier, states pt is back to baseline with memory and attention. Pt has eaten well today, denies any n/v. Denies any SOB, even with exertion and has been on room air all day with SaO2's 94-98%. Still has congested, occasionally productive cough with yellow to tan sputum, small amount. HR regular, 65-80 bpm, NSR per tele. Pt currently talking to wife on the phone.

## 2020-09-26 NOTE — H&P (Signed)
History and Physical  Eric [name] IZT:[id number] DOB: [birth year] DOA: 09/25/2020  Referring physician: Lorie Apley  PCP: Arsenio Katz, NP  Patient coming from:Home  Chief Complaint: Generalized weakness  HPI: Eric [name] is a 75 y.o. male with medical history significant for hypertension, BPH who presents to the emergency department due to generalized weakness and positive COVID test at home.  Patient states that his family wanted him to go to the ED due to generalized weakness.  Patient appears to have had sick contact at home and it was unknown when exactly he tested positive for COVID.  He denies chest pain, shortness of breath, nausea, vomiting or abdominal pain.  ED Course:  In the emergency department, patient was febrile with a temperature of 103.17F and was tachypneic.  Work-up in the ED showed normocytic anemia, hyponatremia, transaminitis, D-dimer 0.74, CRP 2.5, ferritin 384, triglycerides , Lactic acid 1.4x2.  Troponin x2 was flat at 6.  Influenza A, B was negative.  SARS coronavirus 2 was positive. Chest x-ray showed no active disease. Patient was treated with Tylenol, IV Decadron and IV rhythm severe.  IV hydration was provided.  Hospitalist was asked to admit patient for further evaluation and management.  Review of Systems: Constitutional: Positive for fever.  Negative for chills HENT: Negative for ear pain and sore throat.   Eyes: Negative for pain and visual disturbance.  Respiratory: Negative for cough, chest tightness and shortness of breath.   Cardiovascular: Negative for chest pain and palpitations.  Gastrointestinal: Negative for abdominal pain and vomiting.  Endocrine: Negative for polyphagia and polyuria.  Genitourinary: Negative for decreased urine volume, dysuria, enuresis Musculoskeletal: Negative for arthralgias and back pain.  Skin: Negative for color change and rash.  Allergic/Immunologic: Negative for immunocompromised state.   Neurological: Positive for weakness.  Negative for tremors, syncope, speech difficulty Hematological: Does not bruise/bleed easily.  All other systems reviewed and are negative   Past Medical History:  Diagnosis Date   Bladder cancer ([place]) 06/29/2016   2013   Diabetes ([place]) 06/29/2016   Essential hypertension, benign 06/29/2016   High cholesterol 06/29/2016   History of kidney stones    Mass of pituitary [facility]) 04/2020   Past Surgical History:  Procedure Laterality Date   BIOPSY  09/29/2016   Procedure: BIOPSY;  Surgeon: Rogene Houston, MD;  [facility];  Service: Endoscopy;;  colon   BIOPSY  11/14/2019   Procedure: BIOPSY;  Surgeon: Rogene Houston, MD;  [facility];  Service: Endoscopy;;   BLADDER SURGERY     2013   CHOLECYSTECTOMY     COLONOSCOPY N/A 09/29/2016   Procedure: COLONOSCOPY;  Surgeon: Rogene Houston, MD;  [facility];  Service: Endoscopy;  Laterality: N/A;  12:00   COLONOSCOPY N/A 11/14/2019   Procedure: COLONOSCOPY;  Surgeon: Rogene Houston, MD;  [facility];  Service: Endoscopy;  Laterality: N/A;  125, per office, pt knows new arrival time   CRANIOTOMY N/A 04/24/2020   Procedure: 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;  Surgeon: Ashok Pall, MD;  Location: [place];  Service: Neurosurgery;  Laterality: N/A;  3C/RM 20   ESOPHAGOGASTRODUODENOSCOPY N/A 11/14/2019   Procedure: ESOPHAGOGASTRODUODENOSCOPY (EGD);  Surgeon: Rogene Houston, MD;  [facility];  Service: Endoscopy;  Laterality: N/A;   knee athroscopy     TONSILLECTOMY     as child   TRANSPHENOIDAL APPROACH EXPOSURE N/A 04/24/2020   Procedure: TRANSPHENOIDAL  APPROACH EXPOSURE;  Surgeon: Jerrell Belfast, MD;  Location: [facility];  Service: ENT;  Laterality: N/A;    Social History:  reports that he has never smoked. He has never used smokeless tobacco. He reports previous alcohol use. He reports that he does not use  drugs.   No Known Allergies  Family History  Problem Relation Age of Onset   Hypercholesterolemia Father    Hypercholesterolemia Mother    Colon cancer Neg Hx    Urolithiasis Neg Hx    Sudden death Neg Hx    Lupus Neg Hx    Sickle cell trait Neg Hx      Prior to Admission medications   Medication Sig Start Date End Date Taking? Authorizing Provider  amLODipine (NORVASC) 5 MG tablet Take 5 mg by mouth daily.    [provider]  Ascorbic Acid (VITAMIN C) 1000 MG tablet Take 1,000 mg by mouth in the morning and at bedtime.    [provider]  B Complex-C (B-COMPLEX WITH VITAMIN C) tablet Take 1 tablet by mouth daily.    [provider]  Cholecalciferol (VITAMIN D3) 50 MCG (2000 UT) TABS Take 2,000 Units by mouth daily.    [provider]  Coenzyme Q10 (COQ10) 100 MG CAPS Take 100 mg by mouth daily.    [provider]  dutasteride (AVODART) 0.5 MG capsule Take 1 capsule (0.5 mg total) by mouth daily. 09/08/20   McKenzie, Candee Furbish, MD  gabapentin (NEURONTIN) 300 MG capsule Take 300 mg by mouth in the morning, at noon, and at bedtime.  05/09/19   [provider]  HYDROcodone-acetaminophen (NORCO/VICODIN) 5-325 MG tablet Take 1 tablet by mouth every 4 (four) hours as needed for moderate pain. 04/26/20 04/26/21  Costella, Vista Mink, PA-C  lisinopril (PRINIVIL,ZESTRIL) 20 MG tablet Take 20 mg by mouth daily.    [provider]  Magnesium 400 MG TABS Take 400 mg by mouth daily.    [provider]  Misc Natural Products (BLOOD SUGAR BALANCE) TABS Take 1 tablet by mouth in the morning and at bedtime. Glucocil    [provider]  Multiple Vitamin (MULTIVITAMIN WITH MINERALS) TABS tablet Take 1 tablet by mouth daily.    [provider]  Potassium 99 MG TABS Take 99 mg by mouth daily.    [provider]  psyllium (METAMUCIL) 58.6 % powder Take 1 packet by mouth daily.    [provider]   tamsulosin (FLOMAX) 0.4 MG CAPS capsule Take 1 capsule (0.4 mg total) by mouth in the morning and at bedtime. 08/12/20   McKenzie, Candee Furbish, MD  temazepam (RESTORIL) 15 MG capsule Take 15 mg by mouth at bedtime as needed for sleep. 07/25/19   [provider]  Zinc 50 MG TABS Take 50 mg by mouth daily.    [provider]    Physical Exam: BP (!) 110/45   Pulse 66   Temp 98.9 F (37.2 C) (Oral)   Resp (!) 22   Ht 5\' 10"  (1.778 m)   Wt 79.8 kg   SpO2 99%   BMI 25.24 kg/m   General: 75 y.o. year-old male well developed well nourished in no acute distress.  Alert and oriented x3. HEENT: NCAT, EOMI Neck: Supple, trachea medial Cardiovascular: Regular rate and rhythm with no rubs or gallops.  No thyromegaly or JVD noted.  No lower extremity edema. 2/4 pulses in all 4 extremities. Respiratory: Clear to auscultation with no wheezes or rales. Good inspiratory  effort. Abdomen: Soft, nontender nondistended with normal bowel sounds x4 quadrants. Muskuloskeletal: No cyanosis, clubbing or edema noted bilaterally Neuro: CN II-XII intact, strength 5/5 x 4, sensation, reflexes intact Skin: No ulcerative lesions noted or rashes Psychiatry: Mood is appropriate for condition and setting          Labs on Admission:  Basic Metabolic Panel: Recent Labs  Lab 09/25/20 2216  NA 127*  K 4.0  CL 98  CO2 20*  GLUCOSE 103*  BUN 17  CREATININE 1.26*  CALCIUM 8.4*   Liver Function Tests: Recent Labs  Lab 09/25/20 2216  AST 76*  ALT 57*  ALKPHOS 40  BILITOT 1.1  PROT 6.8  ALBUMIN 4.1   No results for input(s): LIPASE, AMYLASE in the last 168 hours. No results for input(s): AMMONIA in the last 168 hours. CBC: Recent Labs  Lab 09/25/20 2216  WBC 5.7  NEUTROABS 3.8  HGB 12.9*  HCT 38.3*  MCV 92.3  PLT 111*   Cardiac Enzymes: No results for input(s): CKTOTAL, CKMB, CKMBINDEX, TROPONINI in the last 168 hours.  BNP (last 3 results) No results for input(s): BNP in the  last 8760 hours.  ProBNP (last 3 results) No results for input(s): PROBNP in the last 8760 hours.  CBG: No results for input(s): GLUCAP in the last 168 hours.  Radiological Exams on Admission: DG Chest Port 1 View  Result Date: 09/25/2020 CLINICAL DATA:  Dyspnea, COVID pneumonia EXAM: PORTABLE CHEST 1 VIEW COMPARISON:  None. FINDINGS: Lung volumes are small, but are symmetric. Minimal left basilar atelectasis. Lungs are otherwise clear. No pneumothorax or pleural effusion. Cardiac size within normal limits. Pulmonary vascularity is normal. No acute bone abnormality. IMPRESSION: No active disease. Electronically Signed   By: Fidela Salisbury MD   On: 09/25/2020 22:57    EKG: I independently viewed the EKG done and my findings are as followed: Normal sinus rhythm at a rate of 93 bpm with RBBB, LAFB and prolonged QTc (564 ms)  Assessment/Plan Present on Admission:  COVID-19 virus infection  Essential hypertension, benign  Principal Problem:   COVID-19 virus infection Active Problems:   Essential hypertension, benign   Generalized weakness   Hyponatremia   Transaminitis   Prolonged QT interval   BPH (benign prostatic hyperplasia)   Generalized weakness in the setting of COVID-19 virus infection SARS coronavirus 2 was positive, he was febrile on arrival to the ED Chest x-ray showed no active disease Procalcitonin will be checked to rule out concomitant bacterial infection IV Decadron 10 mg was given in the ED Continue albuterol q.6h Patient was started on IV Remdesivir per pharmacy protocol, we shall continue with same at this time Continue vitamin-C 500 mg p.o. Daily Continue zinc 220 mg p.o. Daily Continue Mucinex, Robitussin and Tussionex Continue Tylenol p.r.n. for fever Continue supplemental oxygen to maintain O2 sat > or = 94% with plan to wean patient off supplemental oxygen as tolerated (of note, patient does not use oxygen at baseline) Continue incentive spirometry and  flutter valve q12min as tolerated Encourage proning, early ambulation, and side laying as tolerated Continue airborne isolation precaution Inflammatory markers: LDH:  204  CRP: 2.5 D-dimer: 0.74 Ferritin: 384 Continue monitoring daily inflammatory markers  Hyponatremia Na 127; this is possibly due to above/dehydration Gentle hydration will be provided  Transaminitis AST 76, ALT 357, continue to monitor liver enzymes  Prolonged QTc (564 ms) Avoid QT prolonging drugs Magnesium level will be checked Repeat EKG in the morning  Essential hypertension/BPH Amlodipine  and Flomax will be held at this time due to soft BP  DVT prophylaxis: Lovenox  Code Status: Full code  Family Communication: None at bedside  Disposition Plan:  Patient is from:                        home Anticipated DC to:                   SNF or family members home Anticipated DC date:               2-3 days Anticipated DC barriers:          Patient requires inpatient management due to COVID-19 virus infection  Consults called: None  Admission status: Observation    Bernadette Hoit MD [facility]  09/26/2020, 6:11 AM

## 2020-09-27 DIAGNOSIS — U071 COVID-19: Secondary | ICD-10-CM | POA: Diagnosis not present

## 2020-09-27 DIAGNOSIS — G9341 Metabolic encephalopathy: Secondary | ICD-10-CM | POA: Diagnosis not present

## 2020-09-27 DIAGNOSIS — R531 Weakness: Secondary | ICD-10-CM | POA: Diagnosis not present

## 2020-09-27 DIAGNOSIS — J9601 Acute respiratory failure with hypoxia: Secondary | ICD-10-CM | POA: Diagnosis not present

## 2020-09-27 LAB — COMPREHENSIVE METABOLIC PANEL
ALT: 53 U/L — ABNORMAL HIGH (ref 0–44)
AST: 63 U/L — ABNORMAL HIGH (ref 15–41)
Albumin: 3.7 g/dL (ref 3.5–5.0)
Alkaline Phosphatase: 34 U/L — ABNORMAL LOW (ref 38–126)
Anion gap: 8 (ref 5–15)
BUN: 26 mg/dL — ABNORMAL HIGH (ref 8–23)
CO2: 20 mmol/L — ABNORMAL LOW (ref 22–32)
Calcium: 9.1 mg/dL (ref 8.9–10.3)
Chloride: 110 mmol/L (ref 98–111)
Creatinine, Ser: 1.17 mg/dL (ref 0.61–1.24)
GFR, Estimated: >60 mL/min (ref >60)
Glucose, Bld: 188 mg/dL — ABNORMAL HIGH (ref 70–99)
Potassium: 4.2 mmol/L (ref 3.5–5.1)
Sodium: 138 mmol/L (ref 135–145)
Total Bilirubin: 0.8 mg/dL (ref 0.3–1.2)
Total Protein: 6.8 g/dL (ref 6.5–8.1)

## 2020-09-27 LAB — CBC WITH DIFFERENTIAL/PLATELET
Abs Immature Granulocytes: 0.32 10*3/uL — ABNORMAL HIGH (ref 0.00–0.07)
Basophils Absolute: 0 10*3/uL (ref 0.0–0.1)
Basophils Relative: 0 %
Eosinophils Absolute: 0 10*3/uL (ref 0.0–0.5)
Eosinophils Relative: 0 %
HCT: 37.2 % — ABNORMAL LOW (ref 39.0–52.0)
Hemoglobin: 12.5 g/dL — ABNORMAL LOW (ref 13.0–17.0)
Immature Granulocytes: 2 %
Lymphocytes Relative: 10 %
Lymphs Abs: 1.4 10*3/uL (ref 0.7–4.0)
MCH: 30.9 pg (ref 26.0–34.0)
MCHC: 33.6 g/dL (ref 30.0–36.0)
MCV: 92.1 fL (ref 80.0–100.0)
Monocytes Absolute: 0.5 10*3/uL (ref 0.1–1.0)
Monocytes Relative: 3 %
Neutro Abs: 11.8 10*3/uL — ABNORMAL HIGH (ref 1.7–7.7)
Neutrophils Relative %: 85 %
Platelets: 119 10*3/uL — ABNORMAL LOW (ref 150–400)
RBC: 4.04 MIL/uL — ABNORMAL LOW (ref 4.22–5.81)
RDW: 14.1 % (ref 11.5–15.5)
WBC: 14 10*3/uL — ABNORMAL HIGH (ref 4.0–10.5)
nRBC: 0 % (ref 0.0–0.2)

## 2020-09-27 LAB — GLUCOSE, CAPILLARY: Glucose-Capillary: 249 mg/dL — ABNORMAL HIGH (ref 70–99)

## 2020-09-27 LAB — FERRITIN: Ferritin: 445 ng/mL — ABNORMAL HIGH (ref 24–336)

## 2020-09-27 LAB — D-DIMER, QUANTITATIVE: D-Dimer, Quant: 1 ug/mL-FEU — ABNORMAL HIGH (ref 0.00–0.50)

## 2020-09-27 LAB — C-REACTIVE PROTEIN: CRP: 10.5 mg/dL — ABNORMAL HIGH (ref <1.0)

## 2020-09-27 MED ORDER — PREDNISONE 50 MG PO TABS: 50.0000 mg | ORAL_TABLET | Freq: Two times a day (BID) | ORAL | 0 refills | Status: DC

## 2020-09-27 MED ORDER — PREDNISONE 20 MG PO TABS: 50.0000 mg | ORAL_TABLET | Freq: Every day | ORAL | Status: DC

## 2020-09-27 MED ORDER — DOXYCYCLINE HYCLATE 100 MG PO TABS: 100.0000 mg | ORAL_TABLET | Freq: Two times a day (BID) | ORAL | 0 refills | Status: DC

## 2020-09-27 MED ORDER — CEFDINIR 300 MG PO CAPS
300.0000 mg | ORAL_CAPSULE | Freq: Two times a day (BID) | ORAL | 0 refills | Status: DC
Start: 2020-09-27 — End: 2020-12-21

## 2020-09-27 NOTE — Progress Notes (Signed)
Pt discharged to POV via [place] with belongings in his possession.

## 2020-09-27 NOTE — Discharge Summary (Signed)
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  The patient's spouse was positive for COVID.  As result, the decision was made to check the patient for COVID because of his worsening generalized weakness.  The patient also been complaining of a nonproductive cough and dizziness.  He had 1 episode of nausea and vomiting on 09/25/2020.  There is no diarrhea, abdominal pain, dysuria, hematuria.  There is no hemoptysis.  He denied any chest pain, shortness of breath, headache, neck pain. In the emergency department, the patient was febrile up to 103.2 F.  He has been hemodynamically stable.  Oxygen saturation was 90% on room air.  The patient was placed on supplemental oxygen and started on remdesivir and steroids.  Discharge Diagnoses:  Acute respiratory failure with hypoxia secondary to COVID-19 pneumonia -CRP 2.5>>10.5 -Ferritin 384>>445 -D-dimer 0.74>>1.00 -Continue IV Solu-Medrol>>d/c home with 5 more days prednisone -Continue remdesivir--received 2 days during hospitalization -PCT 1.05 -Start empiric doxy and ceftriaxone>>d/c home with cefdinir and doxy x 4 more days -Personally reviewed chest x-ray--increased interstitial markings -Personally reviewed EKG sinus rhythm, RBBB   Thrombocytopenia -Secondary to COVID-19 infection -Check  serum K16--010 -Folic XNAT55.7 -DUK--0.254>>>YHCWCB in one month -repeat CBC in 2-3 weeks   Acute metabolic encephalopathy -Secondary to COVID-19 infection -Obtain UA--no pyuria -overall improved and back to baseline   Essential hypertension -Holding amlodipine and lisinopril secondary to soft blood pressure -restart amlodipine -will not restart lisinopril as BP well controlled   BPH -Continue tamsulosin   Hyperglycemia -7/16 hemoglobin A1c--6.5 -CBGs elevated secondary to steroids   Transaminasemia -due to COVID-19 -no abd pain -monitor serially   Hyponatremia -due to volume depletion and poor solute intake -continue NS>>improved   Discharge Instructions   Allergies as of 09/27/2020   No Known Allergies      Medication List     STOP taking these medications    HYDROcodone-acetaminophen 5-325 MG tablet Commonly known as: NORCO/VICODIN   lisinopril 20 MG tablet Commonly known as: ZESTRIL       TAKE these medications    amLODipine 5 MG tablet Commonly known as: NORVASC Take 5 mg by mouth daily.   B-complex with vitamin C tablet Take 1 tablet by mouth daily.   Blood Sugar Balance Tabs Take 1 tablet by mouth in the morning and at bedtime. Glucocil   CoQ10 100 MG Caps Take 100 mg by mouth daily.   doxycycline 100 MG tablet Commonly known as: VIBRA-TABS Take 1 tablet (100 mg total) by mouth every 12 (twelve) hours.   dutasteride 0.5 MG capsule Commonly known as: AVODART Take 1 capsule (0.5 mg total) by mouth daily.   gabapentin 300 MG capsule Commonly known as: NEURONTIN Take 300 mg by mouth in the morning, at noon, and at bedtime.   Magnesium 400 MG Tabs Take 400 mg by mouth daily.   multivitamin with minerals Tabs tablet Take 1 tablet by mouth daily.   Potassium 99  MG Tabs Take 99 mg by mouth daily.   predniSONE 50 MG tablet Commonly known as: DELTASONE Take 1 tablet (50 mg total) by mouth 2 (two) times daily with a meal.    psyllium 58.6 % powder Commonly known as: METAMUCIL Take 1 packet by mouth daily.   tamsulosin 0.4 MG Caps capsule Commonly known as: FLOMAX Take 1 capsule (0.4 mg total) by mouth in the morning and at bedtime.   temazepam 15 MG capsule Commonly known as: RESTORIL Take 15 mg by mouth at bedtime as needed for sleep.   vitamin C 1000 MG tablet Take 1,000 mg by mouth in the morning and at bedtime.   Vitamin D3 50 MCG (2000 UT) Tabs Take 2,000 Units by mouth daily.   Zinc 50 MG Tabs Take 50 mg by mouth daily.        No Known Allergies  Consultations: none   Procedures/Studies: CT Head Wo Contrast  Result Date: 09/26/2020 CLINICAL DATA:  Delirium. Prior pituitary resection 04/26/2020. Recent subdural hemorrhage. EXAM: CT HEAD WITHOUT CONTRAST TECHNIQUE: Contiguous axial images were obtained from the base of the skull through the vertex without intravenous contrast. COMPARISON:  CT head 07/27/2020 FINDINGS: Brain: Interval resolution of previously noted left frontal subdural hematoma anteriorly. No new area of hemorrhage or fluid collection. Ventricle size normal. Negative for acute infarct, hemorrhage, mass Prior trans-sphenoidal resection of pituitary lesion. No pituitary mass identified. Sella enlarged. Vascular: Negative for hyperdense vessel Skull: Negative Sinuses/Orbits: Negative Other: None IMPRESSION: No acute abnormality. Interval resolution of left frontal subdural hematoma. Electronically Signed   By: Franchot Gallo M.D.   On: 09/26/2020 12:01   DG Chest Port 1 View  Result Date: 09/25/2020 CLINICAL DATA:  Dyspnea, COVID pneumonia EXAM: PORTABLE CHEST 1 VIEW COMPARISON:  None. FINDINGS: Lung volumes are small, but are symmetric. Minimal left basilar atelectasis. Lungs are otherwise clear. No pneumothorax or pleural effusion. Cardiac size within normal limits. Pulmonary vascularity is normal. No acute bone abnormality. IMPRESSION: No active disease. Electronically Signed    By: Fidela Salisbury MD   On: 09/25/2020 22:57        Discharge Exam: Vitals:   09/27/20 0527 09/27/20 0925  BP: 124/71 (!) 107/52  Pulse: 65 74  Resp: 18 18  Temp: 98.4 F (36.9 C)   SpO2: 93% 97%   Vitals:   09/26/20 1600 09/26/20 2148 09/27/20 0527 09/27/20 0925  BP: (!) 126/53 (!) 113/57 124/71 (!) 107/52  Pulse: 67 67 65 74  Resp: 18 18 18 18   Temp:  98.7 F (37.1 C) 98.4 F (36.9 C)   TempSrc:  Oral Oral   SpO2: 95% 91% 93% 97%  Weight:      Height:        General: Pt is alert, awake, not in acute distress Cardiovascular: RRR, S1/S2 +, no rubs, no gallops Respiratory: right basilar rales. No wheeze Abdominal: Soft, NT, ND, bowel sounds + Extremities: no edema, no cyanosis   The results of significant diagnostics from this hospitalization (including imaging, microbiology, ancillary and laboratory) are listed below for reference.    Significant Diagnostic Studies: CT Head Wo Contrast  Result Date: 09/26/2020 CLINICAL DATA:  Delirium. Prior pituitary resection 04/26/2020. Recent subdural hemorrhage. EXAM: CT HEAD WITHOUT CONTRAST TECHNIQUE: Contiguous axial images were obtained from the base of the skull through the vertex without intravenous contrast. COMPARISON:  CT head 07/27/2020 FINDINGS: Brain: Interval resolution of previously noted left frontal subdural hematoma anteriorly. No new area of hemorrhage or fluid  collection. Ventricle size normal. Negative for acute infarct, hemorrhage, mass Prior trans-sphenoidal resection of pituitary lesion. No pituitary mass identified. Sella enlarged. Vascular: Negative for hyperdense vessel Skull: Negative Sinuses/Orbits: Negative Other: None IMPRESSION: No acute abnormality. Interval resolution of left frontal subdural hematoma. Electronically Signed   By: Franchot Gallo M.D.   On: 09/26/2020 12:01   DG Chest Port 1 View  Result Date: 09/25/2020 CLINICAL DATA:  Dyspnea, COVID pneumonia EXAM: PORTABLE CHEST 1 VIEW COMPARISON:   None. FINDINGS: Lung volumes are small, but are symmetric. Minimal left basilar atelectasis. Lungs are otherwise clear. No pneumothorax or pleural effusion. Cardiac size within normal limits. Pulmonary vascularity is normal. No acute bone abnormality. IMPRESSION: No active disease. Electronically Signed   By: Fidela Salisbury MD   On: 09/25/2020 22:57    Microbiology: Recent Results (from the past 240 hour(s))  Blood Culture (routine x 2)     Status: None (Preliminary result)   Collection Time: 09/25/20 11:05 PM   Specimen: BLOOD RIGHT HAND  Result Value Ref Range Status   Specimen Description BLOOD RIGHT HAND  Final   Special Requests   Final    BOTTLES DRAWN AEROBIC AND ANAEROBIC Blood Culture adequate volume   Culture   Final    NO GROWTH 2 DAYS Performed at [facility], [address] [place], [place] [address]    Report Status PENDING  Incomplete  Blood Culture (routine x 2)     Status: None (Preliminary result)   Collection Time: 09/25/20 11:22 PM   Specimen: BLOOD LEFT ARM  Result Value Ref Range Status   Specimen Description BLOOD LEFT ARM  Final   Special Requests   Final    BOTTLES DRAWN AEROBIC AND ANAEROBIC Blood Culture adequate volume   Culture   Final    NO GROWTH 2 DAYS Performed at [facility], [address], [place], [place] [address]    Report Status PENDING  Incomplete  Resp Panel by RT-PCR (Flu A&B, Covid) Nasopharyngeal Swab     Status: Abnormal   Collection Time: 09/25/20 11:28 PM   Specimen: Nasopharyngeal Swab; Nasopharyngeal(NP) swabs in vial transport medium  Result Value Ref Range Status   SARS Coronavirus 2 by RT PCR POSITIVE (A) NEGATIVE Final    Comment: CRITICAL RESULT CALLED TO, READ BACK BY AND VERIFIED WITH: DELL,T 0050 09/26/2020 COLEMAN,R (NOTE) SARS-CoV-2 target nucleic acids are DETECTED.  The SARS-CoV-2 RNA is generally detectable in upper respiratory specimens during the acute phase of infection. Positive results are indicative of the  presence of the identified virus, but do not rule out bacterial infection or co-infection with other pathogens not detected by the test. Clinical correlation with patient history and other diagnostic information is necessary to determine patient infection status. The expected result is Negative.  Fact Sheet for Patients: EntrepreneurPulse.com.au  Fact Sheet for Healthcare Providers: IncredibleEmployment.be  This test is not yet approved or cleared by the [place] FDA and  has been authorized for detection and/or diagnosis of SARS-CoV-2 by FDA under an Emergency Use Authorization (EUA).  This EUA will remain in effect (meaning this tes t can be used) for the duration of  the COVID-19 declaration under Section 564(b)(1) of the Act, 21 U.S.C. section 360bbb-3(b)(1), unless the authorization is terminated or revoked sooner.     Influenza A by PCR NEGATIVE NEGATIVE Final   Influenza B by PCR NEGATIVE NEGATIVE Final    Comment: (NOTE) The Xpert Xpress SARS-CoV-2/FLU/RSV plus assay is intended as an aid in  the diagnosis of influenza from Nasopharyngeal swab specimens and should not be used as a sole basis for treatment. Nasal washings and aspirates are unacceptable for Xpert Xpress SARS-CoV-2/FLU/RSV testing.  Fact Sheet for Patients: EntrepreneurPulse.com.au  Fact Sheet for Healthcare Providers: IncredibleEmployment.be  This test is not yet approved or cleared by the [place] FDA and has been authorized for detection and/or diagnosis of SARS-CoV-2 by FDA under an Emergency Use Authorization (EUA). This EUA will remain in effect (meaning this test can be used) for the duration of the COVID-19 declaration under Section 564(b)(1) of the Act, 21 U.S.C. section 360bbb-3(b)(1), unless the authorization is terminated or revoked.  Performed at [facility], [address], [place], [place] [address]       Labs: Basic Metabolic Panel: Recent Labs  Lab 09/25/20 2216 09/26/20 0556 09/27/20 0543  NA 127* 129* 138  K 4.0 3.7 4.2  CL 98 101 110  CO2 20* 19* 20*  GLUCOSE 103* 204* 188*  BUN 17 18 26*  CREATININE 1.26* 1.33* 1.17  CALCIUM 8.4* 8.4* 9.1  MG  --  2.1  --   PHOS  --  3.7  --    Liver Function Tests: Recent Labs  Lab 09/25/20 2216 09/26/20 0556 09/27/20 0543  AST 76* 80* 63*  ALT 57* 58* 53*  ALKPHOS 40 35* 34*  BILITOT 1.1 0.9 0.8  PROT 6.8 6.6 6.8  ALBUMIN 4.1 3.8 3.7   No results for input(s): LIPASE, AMYLASE in the last 168 hours. No results for input(s): AMMONIA in the last 168 hours. CBC: Recent Labs  Lab 09/25/20 2216 09/26/20 0556 09/27/20 0543  WBC 5.7 9.5 14.0*  NEUTROABS 3.8  --  11.8*  HGB 12.9* 13.1 12.5*  HCT 38.3* 40.2 37.2*  MCV 92.3 95.0 92.1  PLT 111* 105* 119*   Cardiac Enzymes: No results for input(s): CKTOTAL, CKMB, CKMBINDEX, TROPONINI in the last 168 hours. BNP: Invalid input(s): POCBNP CBG: Recent Labs  Lab 09/26/20 1222 09/26/20 1627 09/26/20 2150  GLUCAP 287* 201* 163*    Time coordinating discharge:  36 minutes  Signed:  Orson Eva, DO [facility] Pager: [phone] 09/27/2020, 10:08 AM

## 2020-09-29 LAB — URINE CULTURE: Culture: <10000 — AB

## 2020-09-30 LAB — CULTURE, BLOOD (ROUTINE X 2)
Culture: NO GROWTH
Culture: NO GROWTH
Special Requests: ADEQUATE
Special Requests: ADEQUATE

## 2020-09-30 LAB — GLUCOSE, CAPILLARY: Glucose-Capillary: 167 mg/dL — ABNORMAL HIGH (ref 70–99)

## 2020-10-05 DIAGNOSIS — R059 Cough, unspecified: Secondary | ICD-10-CM | POA: Diagnosis not present

## 2020-10-05 DIAGNOSIS — J029 Acute pharyngitis, unspecified: Secondary | ICD-10-CM | POA: Diagnosis not present

## 2020-10-05 DIAGNOSIS — Z20828 Contact with and (suspected) exposure to other viral communicable diseases: Secondary | ICD-10-CM | POA: Diagnosis not present

## 2020-10-08 DIAGNOSIS — U099 Post covid-19 condition, unspecified: Secondary | ICD-10-CM | POA: Diagnosis not present

## 2020-10-08 DIAGNOSIS — R059 Cough, unspecified: Secondary | ICD-10-CM | POA: Diagnosis not present

## 2020-10-08 DIAGNOSIS — J029 Acute pharyngitis, unspecified: Secondary | ICD-10-CM | POA: Diagnosis not present

## 2020-10-11 DIAGNOSIS — I10 Essential (primary) hypertension: Secondary | ICD-10-CM | POA: Diagnosis not present

## 2020-10-11 DIAGNOSIS — E7849 Other hyperlipidemia: Secondary | ICD-10-CM | POA: Diagnosis not present

## 2020-10-16 ENCOUNTER — Other Ambulatory Visit (HOSPITAL_COMMUNITY): Payer: Self-pay | Admitting: Neurosurgery

## 2020-10-16 DIAGNOSIS — D352 Benign neoplasm of pituitary gland: Secondary | ICD-10-CM

## 2020-10-22 DIAGNOSIS — R911 Solitary pulmonary nodule: Secondary | ICD-10-CM | POA: Diagnosis not present

## 2020-10-22 DIAGNOSIS — I700 Atherosclerosis of aorta: Secondary | ICD-10-CM | POA: Diagnosis not present

## 2020-10-26 ENCOUNTER — Other Ambulatory Visit: Payer: Self-pay | Admitting: Family Medicine

## 2020-10-26 ENCOUNTER — Other Ambulatory Visit (HOSPITAL_COMMUNITY): Payer: Self-pay | Admitting: Family Medicine

## 2020-10-26 DIAGNOSIS — R918 Other nonspecific abnormal finding of lung field: Secondary | ICD-10-CM

## 2020-10-29 ENCOUNTER — Ambulatory Visit (HOSPITAL_COMMUNITY)
Admission: RE | Admit: 2020-10-29 | Discharge: 2020-10-29 | Disposition: A | Discharge status: Home / Self Care | Payer: Medicare Other | Source: Ambulatory Visit | Attending: Neurosurgery | Admitting: Neurosurgery

## 2020-10-29 ENCOUNTER — Other Ambulatory Visit: Payer: Self-pay

## 2020-10-29 DIAGNOSIS — D352 Benign neoplasm of pituitary gland: Secondary | ICD-10-CM | POA: Insufficient documentation

## 2020-10-29 DIAGNOSIS — Z01818 Encounter for other preprocedural examination: Secondary | ICD-10-CM | POA: Diagnosis not present

## 2020-10-29 DIAGNOSIS — Z9889 Other specified postprocedural states: Secondary | ICD-10-CM | POA: Diagnosis not present

## 2020-10-29 DIAGNOSIS — I6200 Nontraumatic subdural hemorrhage, unspecified: Secondary | ICD-10-CM | POA: Diagnosis not present

## 2020-10-29 DIAGNOSIS — G9389 Other specified disorders of brain: Secondary | ICD-10-CM | POA: Diagnosis not present

## 2020-10-29 MED ORDER — GADOBUTROL 1 MMOL/ML IV SOLN
7.0000 mL | Freq: Once | INTRAVENOUS | Status: AC | PRN
  Administered 2020-10-29: 7 mL via INTRAVENOUS

## 2020-11-06 ENCOUNTER — Other Ambulatory Visit (HOSPITAL_COMMUNITY): Payer: Self-pay | Admitting: Podiatry

## 2020-11-06 ENCOUNTER — Ambulatory Visit (HOSPITAL_COMMUNITY)
Admission: RE | Admit: 2020-11-06 | Discharge: 2020-11-06 | Disposition: A | Discharge status: Home / Self Care | Payer: Medicare Other | Source: Ambulatory Visit | Attending: Podiatry | Admitting: Podiatry

## 2020-11-06 ENCOUNTER — Other Ambulatory Visit: Payer: Self-pay

## 2020-11-06 DIAGNOSIS — R600 Localized edema: Secondary | ICD-10-CM | POA: Diagnosis not present

## 2020-11-06 DIAGNOSIS — I82432 Acute embolism and thrombosis of left popliteal vein: Secondary | ICD-10-CM | POA: Diagnosis not present

## 2020-11-06 DIAGNOSIS — M109 Gout, unspecified: Secondary | ICD-10-CM | POA: Diagnosis not present

## 2020-11-06 DIAGNOSIS — Z6826 Body mass index (BMI) 26.0-26.9, adult: Secondary | ICD-10-CM | POA: Diagnosis not present

## 2020-11-09 ENCOUNTER — Encounter (HOSPITAL_COMMUNITY)
Admission: RE | Admit: 2020-11-09 | Discharge: 2020-11-09 | Disposition: A | Discharge status: Home / Self Care | Payer: Medicare Other | Source: Ambulatory Visit | Attending: Family Medicine | Admitting: Family Medicine

## 2020-11-09 ENCOUNTER — Other Ambulatory Visit: Payer: Self-pay

## 2020-11-09 DIAGNOSIS — I700 Atherosclerosis of aorta: Secondary | ICD-10-CM | POA: Insufficient documentation

## 2020-11-09 DIAGNOSIS — K5730 Diverticulosis of large intestine without perforation or abscess without bleeding: Secondary | ICD-10-CM | POA: Insufficient documentation

## 2020-11-09 DIAGNOSIS — R918 Other nonspecific abnormal finding of lung field: Secondary | ICD-10-CM | POA: Insufficient documentation

## 2020-11-09 DIAGNOSIS — I2510 Atherosclerotic heart disease of native coronary artery without angina pectoris: Secondary | ICD-10-CM | POA: Insufficient documentation

## 2020-11-09 DIAGNOSIS — C679 Malignant neoplasm of bladder, unspecified: Secondary | ICD-10-CM | POA: Diagnosis not present

## 2020-11-09 DIAGNOSIS — U071 COVID-19: Secondary | ICD-10-CM | POA: Diagnosis not present

## 2020-11-09 LAB — GLUCOSE, CAPILLARY: Glucose-Capillary: 137 mg/dL — ABNORMAL HIGH (ref 70–99)

## 2020-11-09 MED ORDER — FLUDEOXYGLUCOSE F - 18 (FDG) INJECTION
8.7400 | Freq: Once | INTRAVENOUS | Status: AC | PRN
  Administered 2020-11-09: 8.74 via INTRAVENOUS

## 2020-11-11 DIAGNOSIS — I10 Essential (primary) hypertension: Secondary | ICD-10-CM | POA: Diagnosis not present

## 2020-11-11 DIAGNOSIS — E7849 Other hyperlipidemia: Secondary | ICD-10-CM | POA: Diagnosis not present

## 2020-11-13 DIAGNOSIS — I82432 Acute embolism and thrombosis of left popliteal vein: Secondary | ICD-10-CM | POA: Diagnosis not present

## 2020-11-17 DIAGNOSIS — I82432 Acute embolism and thrombosis of left popliteal vein: Secondary | ICD-10-CM | POA: Diagnosis not present

## 2020-11-17 DIAGNOSIS — Z8616 Personal history of COVID-19: Secondary | ICD-10-CM | POA: Diagnosis not present

## 2020-11-17 DIAGNOSIS — I10 Essential (primary) hypertension: Secondary | ICD-10-CM | POA: Diagnosis not present

## 2020-11-17 DIAGNOSIS — I700 Atherosclerosis of aorta: Secondary | ICD-10-CM | POA: Diagnosis not present

## 2020-11-17 DIAGNOSIS — Z6826 Body mass index (BMI) 26.0-26.9, adult: Secondary | ICD-10-CM | POA: Diagnosis not present

## 2020-11-17 DIAGNOSIS — Z20828 Contact with and (suspected) exposure to other viral communicable diseases: Secondary | ICD-10-CM | POA: Diagnosis not present

## 2020-11-17 DIAGNOSIS — D696 Thrombocytopenia, unspecified: Secondary | ICD-10-CM | POA: Diagnosis not present

## 2020-11-17 DIAGNOSIS — R911 Solitary pulmonary nodule: Secondary | ICD-10-CM | POA: Diagnosis not present

## 2020-11-17 DIAGNOSIS — J9601 Acute respiratory failure with hypoxia: Secondary | ICD-10-CM | POA: Diagnosis not present

## 2020-11-17 DIAGNOSIS — E1165 Type 2 diabetes mellitus with hyperglycemia: Secondary | ICD-10-CM | POA: Diagnosis not present

## 2020-11-17 DIAGNOSIS — R7989 Other specified abnormal findings of blood chemistry: Secondary | ICD-10-CM | POA: Diagnosis not present

## 2020-11-18 ENCOUNTER — Encounter: Payer: Self-pay | Admitting: Family Medicine

## 2020-11-24 DIAGNOSIS — Z1331 Encounter for screening for depression: Secondary | ICD-10-CM | POA: Diagnosis not present

## 2020-11-24 DIAGNOSIS — Z1389 Encounter for screening for other disorder: Secondary | ICD-10-CM | POA: Diagnosis not present

## 2020-11-24 DIAGNOSIS — D696 Thrombocytopenia, unspecified: Secondary | ICD-10-CM | POA: Diagnosis not present

## 2020-11-24 DIAGNOSIS — I10 Essential (primary) hypertension: Secondary | ICD-10-CM | POA: Diagnosis not present

## 2020-11-24 DIAGNOSIS — R7989 Other specified abnormal findings of blood chemistry: Secondary | ICD-10-CM | POA: Diagnosis not present

## 2020-11-24 DIAGNOSIS — I82432 Acute embolism and thrombosis of left popliteal vein: Secondary | ICD-10-CM | POA: Diagnosis not present

## 2020-11-24 DIAGNOSIS — E1169 Type 2 diabetes mellitus with other specified complication: Secondary | ICD-10-CM | POA: Diagnosis not present

## 2020-11-24 DIAGNOSIS — I700 Atherosclerosis of aorta: Secondary | ICD-10-CM | POA: Diagnosis not present

## 2020-11-26 ENCOUNTER — Telehealth: Payer: Self-pay

## 2020-11-26 NOTE — Telephone Encounter (Signed)
-----   Message from Garner Nash, DO sent at 11/26/2020  9:48 AM EDT ----- Please schedule with me for next available nodule slot consult  Thanks Brad  ----- Message ----- From: Garner Nash, DO Sent: 11/24/2020   9:19 PM EDT To: Jerrilyn Cairo, RN, #  Ill get scheduled for a consult  Thanks  BLI   ----- Message ----- From: Lajuana Matte, MD Sent: 11/24/2020   1:11 PM EDT To: Jerrilyn Cairo, RN, #  Icard should be able to do a bronch EBUS  ----- Message ----- From: Laury Deep, RN Sent: 11/24/2020  12:15 PM EDT To: Oleta Mouse, DO, #  Dr. Valeta Harms: see below.  Is this someone you would see or does he need to come here first?  Thanks, Ryan ----- Message ----- From: Ned Clines Sent: 11/24/2020  10:01 AM EDT To: Laury Deep, RN  LUL nodule w/hypermetabolic lymph nodes eval bx vs observation, , ct chest 8/11, PET 8/29 MRI brain 8/18,  [id number], notes here, disk here--where do you want me to add this one?  Thanks  cindy

## 2020-11-26 NOTE — Telephone Encounter (Signed)
Called and spoke with patient who is now scheduled for consult with Dr. Valeta Harms. Nothing further needed at this time.   Next Appt With Pulmonology Octavio Graves Icard, DO)12/21/2020 at  2:30 PM

## 2020-12-03 DIAGNOSIS — Z6825 Body mass index (BMI) 25.0-25.9, adult: Secondary | ICD-10-CM | POA: Diagnosis not present

## 2020-12-03 DIAGNOSIS — I10 Essential (primary) hypertension: Secondary | ICD-10-CM | POA: Diagnosis not present

## 2020-12-03 DIAGNOSIS — S065X9A Traumatic subdural hemorrhage with loss of consciousness of unspecified duration, initial encounter: Secondary | ICD-10-CM | POA: Diagnosis not present

## 2020-12-03 DIAGNOSIS — D352 Benign neoplasm of pituitary gland: Secondary | ICD-10-CM | POA: Diagnosis not present

## 2020-12-21 ENCOUNTER — Encounter: Payer: Self-pay | Admitting: Pulmonary Disease

## 2020-12-21 ENCOUNTER — Other Ambulatory Visit: Payer: Self-pay

## 2020-12-21 ENCOUNTER — Ambulatory Visit (INDEPENDENT_AMBULATORY_CARE_PROVIDER_SITE_OTHER): Payer: Medicare Other | Admitting: Pulmonary Disease

## 2020-12-21 VITALS — BP 120/78 | HR 71 | Temp 97.8°F | Ht 68.0 in | Wt 176.0 lb

## 2020-12-21 DIAGNOSIS — R911 Solitary pulmonary nodule: Secondary | ICD-10-CM | POA: Diagnosis not present

## 2020-12-21 DIAGNOSIS — R599 Enlarged lymph nodes, unspecified: Secondary | ICD-10-CM

## 2020-12-21 DIAGNOSIS — Z8616 Personal history of COVID-19: Secondary | ICD-10-CM | POA: Diagnosis not present

## 2020-12-21 NOTE — Progress Notes (Signed)
Synopsis: Referred in Oct 2022 for lung nodules by Curlene Labrum, MD  Subjective:   PATIENT ID: Eric [name] GENDER: male DOB: [birth year], MRN: [id number]  Chief Complaint  Patient presents with   Consult    75 YO M, ho bladder cancer in 2013-2016, covid in July 2022, history of kidney stones, former smoker, started smoking back in high school and smoked for just a few years.  From a respiratory standpoint patient is doing well today.  He is slowly recovering from [place] in July.  He does feel more rundown than he had in the past but he is feeling better.  He had a CT scan of the chest which revealed a macrolobulated left upper lobe nodule.  He had a PET scan which showed low-level PET uptake as well as some mild PET uptake within the mediastinum however these nodes were also very small in size.  Recommending short-term CT scan follow-up.  Does not have a very significant smoking history but does have a history of bladder cancer.   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     Family History  Problem Relation Age of Onset   Hypercholesterolemia Father    Hypercholesterolemia Mother    Colon cancer Neg Hx    Urolithiasis Neg Hx    Sudden death Neg Hx    Lupus Neg Hx    Sickle cell trait Neg Hx      Past Surgical History:  Procedure Laterality Date   BIOPSY  09/29/2016   Procedure: BIOPSY;  Surgeon: Rogene Houston, MD;  [facility];  Service: Endoscopy;;  colon   BIOPSY  11/14/2019   Procedure: BIOPSY;  Surgeon: Rogene Houston, MD;  [facility];  Service: Endoscopy;;   BLADDER SURGERY     2013   CHOLECYSTECTOMY     COLONOSCOPY N/A 09/29/2016   Procedure: COLONOSCOPY;  Surgeon: Rogene Houston, MD;  [facility];  Service:  Endoscopy;  Laterality: N/A;  12:00   COLONOSCOPY N/A 11/14/2019   Procedure: COLONOSCOPY;  Surgeon: Rogene Houston, MD;  [facility];  Service: Endoscopy;  Laterality: N/A;  125, per office, pt knows new arrival time   CRANIOTOMY N/A 04/24/2020   Procedure: 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;  Surgeon: Ashok Pall, MD;  Location: [place];  Service: Neurosurgery;  Laterality: N/A;  3C/RM 20   ESOPHAGOGASTRODUODENOSCOPY N/A 11/14/2019   Procedure: ESOPHAGOGASTRODUODENOSCOPY (EGD);  Surgeon: Rogene Houston, MD;  [facility];  Service: Endoscopy;  Laterality: N/A;   knee athroscopy     TONSILLECTOMY     as child   TRANSPHENOIDAL APPROACH EXPOSURE N/A 04/24/2020   Procedure: TRANSPHENOIDAL APPROACH EXPOSURE;  Surgeon: Jerrell Belfast, MD;  Location: [place];  Service: ENT;  Laterality: N/A;    Social History   Socioeconomic History   Marital status: Married    Spouse name: Not on file   Number of children: Not on file   Years of education: Not on file   Highest education level: Not on file  Occupational History   Not on file  Tobacco Use   Smoking status: Never   Smokeless tobacco: Never  Vaping Use   Vaping Use:  Never used  Substance and Sexual Activity   Alcohol use: Not Currently   Drug use: No   Sexual activity: Not Currently  Other Topics Concern   Not on file  Social History Narrative   Not on file   Social Determinants of Health   Financial Resource Strain: Not on file  Food Insecurity: Not on file  Transportation Needs: Not on file  Physical Activity: Not on file  Stress: Not on file  Social Connections: Not on file  Intimate Partner Violence: Not on file     No Known Allergies   Outpatient Medications Prior to Visit  Medication Sig Dispense Refill   Ascorbic Acid (VITAMIN C) 1000 MG tablet Take 1,000 mg by mouth in the morning and at bedtime.     B Complex-C (B-COMPLEX WITH VITAMIN C) tablet Take 1  tablet by mouth daily.     Cholecalciferol (VITAMIN D3) 50 MCG (2000 UT) TABS Take 2,000 Units by mouth daily.     Coenzyme Q10 (COQ10) 100 MG CAPS Take 100 mg by mouth daily.     dutasteride (AVODART) 0.5 MG capsule Take 1 capsule (0.5 mg total) by mouth daily. 90 capsule 3   gabapentin (NEURONTIN) 300 MG capsule Take 300 mg by mouth in the morning, at noon, and at bedtime.      Magnesium 400 MG TABS Take 400 mg by mouth daily.     Misc Natural Products (BLOOD SUGAR BALANCE) TABS Take 1 tablet by mouth in the morning and at bedtime. Glucocil     Multiple Vitamin (MULTIVITAMIN WITH MINERALS) TABS tablet Take 1 tablet by mouth daily.     Potassium 99 MG TABS Take 99 mg by mouth daily.     psyllium (METAMUCIL) 58.6 % powder Take 1 packet by mouth daily.     tamsulosin (FLOMAX) 0.4 MG CAPS capsule Take 1 capsule (0.4 mg total) by mouth in the morning and at bedtime. 60 capsule 11   temazepam (RESTORIL) 15 MG capsule Take 15 mg by mouth at bedtime as needed for sleep.     Zinc 50 MG TABS Take 50 mg by mouth daily.     amLODipine (NORVASC) 5 MG tablet Take 5 mg by mouth daily.     cefdinir (OMNICEF) 300 MG capsule Take 1 capsule (300 mg total) by mouth 2 (two) times daily. 8 capsule 0   doxycycline (VIBRA-TABS) 100 MG tablet Take 1 tablet (100 mg total) by mouth every 12 (twelve) hours. 8 tablet 0   predniSONE (DELTASONE) 50 MG tablet Take 1 tablet (50 mg total) by mouth 2 (two) times daily with a meal. 10 tablet 0   No facility-administered medications prior to visit.    Review of Systems  Constitutional:  Negative for chills, fever, malaise/fatigue and weight loss.  HENT:  Negative for hearing loss, sore throat and tinnitus.   Eyes:  Negative for blurred vision and double vision.  Respiratory:  Negative for cough, hemoptysis, sputum production, shortness of breath, wheezing and stridor.   Cardiovascular:  Negative for chest pain, palpitations, orthopnea, leg swelling and PND.   Gastrointestinal:  Negative for abdominal pain, constipation, diarrhea, heartburn, nausea and vomiting.  Genitourinary:  Negative for dysuria, hematuria and urgency.  Musculoskeletal:  Negative for joint pain and myalgias.  Skin:  Negative for itching and rash.  Neurological:  Negative for dizziness, tingling, weakness and headaches.  Endo/Heme/Allergies:  Negative for environmental allergies. Does not bruise/bleed easily.  Psychiatric/Behavioral:  Negative for depression. The patient is not  nervous/anxious and does not have insomnia.   All other systems reviewed and are negative.   Objective:  Physical Exam Vitals reviewed.  Constitutional:      General: He is not in acute distress.    Appearance: He is well-developed.  HENT:     Head: Normocephalic and atraumatic.  Eyes:     General: No scleral icterus.    Conjunctiva/sclera: Conjunctivae normal.     Pupils: Pupils are equal, round, and reactive to light.  Neck:     Vascular: No JVD.     Trachea: No tracheal deviation.  Cardiovascular:     Rate and Rhythm: Normal rate and regular rhythm.     Heart sounds: Normal heart sounds. No murmur heard. Pulmonary:     Effort: Pulmonary effort is normal. No tachypnea, accessory muscle usage or respiratory distress.     Breath sounds: No stridor. No wheezing, rhonchi or rales.  Abdominal:     General: Bowel sounds are normal. There is no distension.     Palpations: Abdomen is soft.     Tenderness: There is no abdominal tenderness.  Musculoskeletal:        General: No tenderness.     Cervical back: Neck supple.  Lymphadenopathy:     Cervical: No cervical adenopathy.  Skin:    General: Skin is warm and dry.     Capillary Refill: Capillary refill takes less than 2 seconds.     Findings: No rash.  Neurological:     Mental Status: He is alert and oriented to person, place, and time.  Psychiatric:        Behavior: Behavior normal.     Vitals:   12/21/20 1431  BP: 120/78  Pulse:  71  Temp: 97.8 F (36.6 C)  TempSrc: Oral  SpO2: 99%  Weight: 176 lb (79.8 kg)  Height: 5\' 8"  (1.727 m)   99% on RA BMI Readings from Last 3 Encounters:  12/21/20 26.76 kg/m  09/26/20 25.24 kg/m  08/12/20 26.26 kg/m   Wt Readings from Last 3 Encounters:  12/21/20 176 lb (79.8 kg)  09/26/20 175 lb 14.8 oz (79.8 kg)  04/24/20 182 lb 15.7 oz (83 kg)     CBC    Component Value Date/Time   WBC 14.0 (H) 09/27/2020 0543   RBC 4.04 (L) 09/27/2020 0543   HGB 12.5 (L) 09/27/2020 0543   HCT 37.2 (L) 09/27/2020 0543   PLT 119 (L) 09/27/2020 0543   MCV 92.1 09/27/2020 0543   MCH 30.9 09/27/2020 0543   MCHC 33.6 09/27/2020 0543   RDW 14.1 09/27/2020 0543   LYMPHSABS 1.4 09/27/2020 0543   MONOABS 0.5 09/27/2020 0543   EOSABS 0.0 09/27/2020 0543   BASOSABS 0.0 09/27/2020 0543    Chest Imaging: CT chest August 2022: Upper lobe left-sided lobulated nodule concerning for malignancy. The patient's images have been independently reviewed by me.    11/10/2020 nuclear medicine PET scan: Patient has low-level PET uptake within the lesion.  Also small amount of hypermetabolic adenopathy within the chest. The patient's images have been independently reviewed by me.     Pulmonary Functions Testing Results: No flowsheet data found.  FeNO:   Pathology:   Echocardiogram:   Heart Catheterization:     Assessment & Plan:     ICD-10-CM   1. Lung nodule  R91.1 CT Super D Chest Wo Contrast    2. Adenopathy  R59.9     3. History of COVID-19  Z86.16  Discussion:  This is a 75 year old gentleman, history of COVID-19, was a smoker for a few weeks back in high school, history of bladder cancer 20 13-20 16.  Found to have incidental left upper lobe lobulated nodule concerning for malignancy.  Patient had PET scan imaging with low-level PET uptake as well as some mild metabolic uptake within the hilum.  He was just a few weeks out from his COVID diagnosis.  I do believe that  differential diagnosis includes an inflammatory nodule and the adenopathy within the chest could be related to his recent COVID history.  Plan: I think he needs a repeat noncontrasted CT scan of the chest before consideration for bronchoscopy and biopsy. Overall patient is agreeable with this plan. We will have him set up for his noncontrasted CT and then a 2-week follow-up for a televisit to discuss results with me.     Current Outpatient Medications:    Ascorbic Acid (VITAMIN C) 1000 MG tablet, Take 1,000 mg by mouth in the morning and at bedtime., Disp: , Rfl:    B Complex-C (B-COMPLEX WITH VITAMIN C) tablet, Take 1 tablet by mouth daily., Disp: , Rfl:    Cholecalciferol (VITAMIN D3) 50 MCG (2000 UT) TABS, Take 2,000 Units by mouth daily., Disp: , Rfl:    Coenzyme Q10 (COQ10) 100 MG CAPS, Take 100 mg by mouth daily., Disp: , Rfl:    dutasteride (AVODART) 0.5 MG capsule, Take 1 capsule (0.5 mg total) by mouth daily., Disp: 90 capsule, Rfl: 3   gabapentin (NEURONTIN) 300 MG capsule, Take 300 mg by mouth in the morning, at noon, and at bedtime. , Disp: , Rfl:    Magnesium 400 MG TABS, Take 400 mg by mouth daily., Disp: , Rfl:    Misc Natural Products (BLOOD SUGAR BALANCE) TABS, Take 1 tablet by mouth in the morning and at bedtime. Glucocil, Disp: , Rfl:    Multiple Vitamin (MULTIVITAMIN WITH MINERALS) TABS tablet, Take 1 tablet by mouth daily., Disp: , Rfl:    Potassium 99 MG TABS, Take 99 mg by mouth daily., Disp: , Rfl:    psyllium (METAMUCIL) 58.6 % powder, Take 1 packet by mouth daily., Disp: , Rfl:    tamsulosin (FLOMAX) 0.4 MG CAPS capsule, Take 1 capsule (0.4 mg total) by mouth in the morning and at bedtime., Disp: 60 capsule, Rfl: 11   temazepam (RESTORIL) 15 MG capsule, Take 15 mg by mouth at bedtime as needed for sleep., Disp: , Rfl:    Zinc 50 MG TABS, Take 50 mg by mouth daily., Disp: , Rfl:     Garner Nash, DO [facility] 12/21/2020 2:54 PM

## 2020-12-21 NOTE — Patient Instructions (Addendum)
Thank you for visiting Dr. Valeta Harms at [facility]. Today we recommend the following:  Orders Placed This Encounter  Procedures   CT Super D Chest Wo Contrast   Return in about 2 weeks (around 01/04/2021) for Telephone visit with Dr. Valeta Harms .    Please do your part to reduce the spread of COVID-19.

## 2020-12-28 ENCOUNTER — Other Ambulatory Visit: Payer: Self-pay

## 2020-12-28 ENCOUNTER — Ambulatory Visit (HOSPITAL_COMMUNITY)
Admission: RE | Admit: 2020-12-28 | Discharge: 2020-12-28 | Disposition: A | Discharge status: Home / Self Care | Payer: Medicare Other | Source: Ambulatory Visit | Attending: Pulmonary Disease | Admitting: Pulmonary Disease

## 2020-12-28 DIAGNOSIS — R911 Solitary pulmonary nodule: Secondary | ICD-10-CM | POA: Insufficient documentation

## 2021-01-04 ENCOUNTER — Ambulatory Visit (INDEPENDENT_AMBULATORY_CARE_PROVIDER_SITE_OTHER): Payer: Medicare Other | Admitting: Pulmonary Disease

## 2021-01-04 ENCOUNTER — Other Ambulatory Visit: Payer: Self-pay

## 2021-01-04 DIAGNOSIS — R911 Solitary pulmonary nodule: Secondary | ICD-10-CM

## 2021-01-04 NOTE — Progress Notes (Signed)
Virtual Visit via Telephone Note  I connected with Eric [name] on 01/04/21 at  9:00 AM EDT by telephone and verified that I am speaking with the correct person using two identifiers.  Location: Patient: Home Provider: Office   I discussed the limitations, risks, security and privacy concerns of performing an evaluation and management service by telephone and the availability of in person appointments. I also discussed with the patient that there may be a patient responsible charge related to this service. The patient expressed understanding and agreed to proceed.  History of Present Illness:  75 YO M, ho bladder cancer in 2013-2016, covid in July 2022, history of kidney stones, former smoker, started smoking back in high school and smoked for just a few years.  From a respiratory standpoint patient is doing well today.  He is slowly recovering from [place] in July.  He does feel more rundown than he had in the past but he is feeling better.  He had a CT scan of the chest which revealed a macrolobulated left upper lobe nodule.  He had a PET scan which showed low-level PET uptake as well as some mild PET uptake within the mediastinum however these nodes were also very small in size.  Recommending short-term CT scan follow-up.  Does not have a very significant smoking history but does have a history of bladder cancer.  OV  01/04/2021: Reviewed recent super D CT imaging.  Left upper lobe bilobed nodule still persistent in size and shape.  We discussed this today in the office.  We discussed all of the next options to include serial CT imaging versus biopsy versus consideration for single anesthetic event with removal.  Patient is most interested in removal.  He is a former smoker has not had PFTs we discussed importance of getting these done.   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 Former smoker History of bladder cancer however this was in 2016 Plan: Discussed possibility of biopsy versus consideration for single anesthetic event with Dr. Kipp Brood from cardiothoracic surgery. Patient would like to speak with thoracic surgery and would like to have this removed if at all possible. We will obtain pulmonary function test. Referral placed for evaluation by thoracic surgery.  We appreciate their input.  Follow Up Instructions:  I discussed the assessment and treatment plan with the patient. The patient was provided an opportunity to ask questions and all were answered. The patient agreed with the plan and demonstrated an understanding of the instructions.   The patient was advised to call back or seek an in-person evaluation if the symptoms worsen or if the condition fails to improve as anticipated.  I provided 22 minutes of non-face-to-face time during this encounter.   Garner Nash, DO

## 2021-01-05 DIAGNOSIS — I82432 Acute embolism and thrombosis of left popliteal vein: Secondary | ICD-10-CM | POA: Diagnosis not present

## 2021-01-05 DIAGNOSIS — E236 Other disorders of pituitary gland: Secondary | ICD-10-CM | POA: Diagnosis not present

## 2021-01-05 DIAGNOSIS — Z20828 Contact with and (suspected) exposure to other viral communicable diseases: Secondary | ICD-10-CM | POA: Diagnosis not present

## 2021-01-05 DIAGNOSIS — G5790 Unspecified mononeuropathy of unspecified lower limb: Secondary | ICD-10-CM | POA: Diagnosis not present

## 2021-01-05 DIAGNOSIS — I700 Atherosclerosis of aorta: Secondary | ICD-10-CM | POA: Diagnosis not present

## 2021-01-05 DIAGNOSIS — I10 Essential (primary) hypertension: Secondary | ICD-10-CM | POA: Diagnosis not present

## 2021-01-05 DIAGNOSIS — R911 Solitary pulmonary nodule: Secondary | ICD-10-CM | POA: Diagnosis not present

## 2021-01-05 DIAGNOSIS — E1169 Type 2 diabetes mellitus with other specified complication: Secondary | ICD-10-CM | POA: Diagnosis not present

## 2021-01-08 ENCOUNTER — Other Ambulatory Visit: Payer: Self-pay

## 2021-01-08 ENCOUNTER — Ambulatory Visit: Payer: Medicare Other | Admitting: Pulmonary Disease

## 2021-01-08 DIAGNOSIS — R911 Solitary pulmonary nodule: Secondary | ICD-10-CM

## 2021-01-08 NOTE — Progress Notes (Signed)
Unable to complete any of PFT today. Pt had hard time hearing and therefore could not understand how to preform correct technique.

## 2021-01-14 NOTE — Progress Notes (Signed)
[address]       [place] [address]             [phone]                    Eric [name] [place] Medical Record #[id number] Date of Birth: 08/26/1945  Referring: Garner Nash, DO Primary Care: Curlene Labrum, MD Primary Cardiologist: None  Chief Complaint:    Chief Complaint  Patient presents with   Lung Lesion    Surgical consult, PET Scan 11/09/20, Chest CT 12/25/20, PFT's 01/08/21    History of Present Illness:    Eric [name] 75 y.o. male with a history of bladder cancer originally treated in 2013 presents for surgical evaluation of a left upper lobe pulmonary nodule measuring 1.4 cm, and a right middle lobe pulmonary nodule measuring 5 mm.  He is a previous smoker, and was also treated with COVID-19 in July 2022.  He occasionally has some shortness of breath when he exerts himself.  He denies any chest pain.    Smoking Hx: Quit during high school   Zubrod Score: At the time of surgery this patient's most appropriate activity status/level should be described as: [x]     0    Normal activity, no symptoms []     1    Restricted in physical strenuous activity but ambulatory, able to do out light work []     2    Ambulatory and capable of self care, unable to do work activities, up and about               >50 % of waking hours                              []     3    Only limited self care, in bed greater than 50% of waking hours []     4    Completely disabled, no self care, confined to bed or chair []     5    Moribund   Past Medical History:  Diagnosis Date   Bladder cancer ([place]) 06/29/2016   2013   Diabetes ([place]) 06/29/2016   Essential hypertension, benign 06/29/2016   High cholesterol 06/29/2016   History of kidney stones    Mass of pituitary ([place]) 04/2020    Past Surgical History:  Procedure Laterality Date   BIOPSY  09/29/2016   Procedure: BIOPSY;  Surgeon: Rogene Houston, MD;  [facility];   Service: Endoscopy;;  colon   BIOPSY  11/14/2019   Procedure: BIOPSY;  Surgeon: Rogene Houston, MD;  [facility];  Service: Endoscopy;;   BLADDER SURGERY     2013   CHOLECYSTECTOMY     COLONOSCOPY N/A 09/29/2016   Procedure: COLONOSCOPY;  Surgeon: Rogene Houston, MD;  [facility];  Service: Endoscopy;  Laterality: N/A;  12:00   COLONOSCOPY N/A 11/14/2019   Procedure: COLONOSCOPY;  Surgeon: Rogene Houston, MD;  [facility];  Service: Endoscopy;  Laterality: N/A;  125, per office, pt knows new arrival time   CRANIOTOMY N/A 04/24/2020   Procedure: 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;  Surgeon: Ashok Pall, MD;  Location: [facility];  Service: Neurosurgery;  Laterality: N/A;  3C/RM 20   ESOPHAGOGASTRODUODENOSCOPY N/A 11/14/2019   Procedure: ESOPHAGOGASTRODUODENOSCOPY (EGD);  Surgeon: Laural Golden,  Mechele Dawley, MD;  [facility];  Service: Endoscopy;  Laterality: N/A;   knee athroscopy     TONSILLECTOMY     as child   TRANSPHENOIDAL APPROACH EXPOSURE N/A 04/24/2020   Procedure: TRANSPHENOIDAL APPROACH EXPOSURE;  Surgeon: Jerrell Belfast, MD;  Location: [facility] OR;  Service: ENT;  Laterality: N/A;    Family History  Problem Relation Age of Onset   Hypercholesterolemia Father    Hypercholesterolemia Mother    Colon cancer Neg Hx    Urolithiasis Neg Hx    Sudden death Neg Hx    Lupus Neg Hx    Sickle cell trait Neg Hx      Social History   Tobacco Use  Smoking Status Never  Smokeless Tobacco Never    Social History   Substance and Sexual Activity  Alcohol Use Not Currently     No Known Allergies  Current Outpatient Medications  Medication Sig Dispense Refill   Ascorbic Acid (VITAMIN C) 1000 MG tablet Take 1,000 mg by mouth in the morning and at bedtime.     B Complex-C (B-COMPLEX WITH VITAMIN C) tablet Take 1 tablet by mouth daily.     Cholecalciferol (VITAMIN D3) 50 MCG (2000 UT) TABS Take 2,000 Units by mouth  daily.     Coenzyme Q10 (COQ10) 100 MG CAPS Take 100 mg by mouth daily.     dutasteride (AVODART) 0.5 MG capsule Take 1 capsule (0.5 mg total) by mouth daily. 90 capsule 3   gabapentin (NEURONTIN) 300 MG capsule Take 300 mg by mouth in the morning, at noon, and at bedtime.      Magnesium 400 MG TABS Take 400 mg by mouth daily.     Misc Natural Products (BLOOD SUGAR BALANCE) TABS Take 1 tablet by mouth in the morning and at bedtime. Glucocil     Multiple Vitamin (MULTIVITAMIN WITH MINERALS) TABS tablet Take 1 tablet by mouth daily.     Potassium 99 MG TABS Take 99 mg by mouth daily.     psyllium (METAMUCIL) 58.6 % powder Take 1 packet by mouth daily.     tamsulosin (FLOMAX) 0.4 MG CAPS capsule Take 1 capsule (0.4 mg total) by mouth in the morning and at bedtime. 60 capsule 11   temazepam (RESTORIL) 15 MG capsule Take 15 mg by mouth at bedtime as needed for sleep.     Zinc 50 MG TABS Take 50 mg by mouth daily.     No current facility-administered medications for this visit.    Review of Systems  Constitutional:  Negative for malaise/fatigue and weight loss.  Respiratory:  Positive for shortness of breath.   Cardiovascular:  Negative for chest pain.    PHYSICAL EXAMINATION: BP (!) 167/76 (BP Location: Left Arm, Patient Position: Sitting)   Pulse 68   Resp 20   Ht 5\' 8"  (1.727 m)   Wt 176 lb (79.8 kg)   SpO2 99% Comment: RA  BMI 26.76 kg/m  Physical Exam Constitutional:      General: He is not in acute distress.    Appearance: Normal appearance. He is normal weight. He is not ill-appearing.  HENT:     Head: Normocephalic and atraumatic.  Cardiovascular:     Rate and Rhythm: Normal rate.  Pulmonary:     Effort: Pulmonary effort is normal. No respiratory distress.  Abdominal:     General: Abdomen is flat. There is no distension.  Musculoskeletal:     Cervical back: Normal range of motion.  Neurological:  General: No focal deficit present.     Mental Status: He is alert and  oriented to person, place, and time.    Diagnostic Studies & Laboratory data:     Recent Radiology Findings:   CT Super D Chest Wo Contrast  Result Date: 12/29/2020 CLINICAL DATA:  LEFT upper lobe pulmonary nodule. EXAM: CT CHEST WITHOUT CONTRAST TECHNIQUE: Multidetector CT imaging of the chest was performed using thin slice collimation for electromagnetic bronchoscopy planning purposes, without intravenous contrast. COMPARISON:  CT 10/22/2020, PET-CT scan 11/09/2020 FINDINGS: Cardiovascular: Coronary artery calcification and aortic atherosclerotic calcification. Mediastinum/Nodes: No axillary or supraclavicular adenopathy. No mediastinal or hilar adenopathy. No pericardial fluid. Esophagus normal. Lungs/Pleura: Bilobed LEFT upper lobe pulmonary nodule measures 14 mm in greatest dimension (image 32/4) compared to 16 mm on CT 10/22/2020. Nodule is mildly hypermetabolic on comparison FDG PET scan. Upper Abdomen: Limited view of the liver, kidneys, pancreas are unremarkable. Normal adrenal glands. Peribronchial nodule in the RIGHT middle lobe measuring 5 mm (image 93/4) is unchanged Musculoskeletal: No aggressive osseous lesion. IMPRESSION: 1. Persistent bilobed LEFT upper lobe pulmonary nodule. 2. Small RIGHT middle lobe pulmonary nodule (5 mm). Recommend attention on follow-up. Electronically Signed   By: Suzy Bouchard M.D.   On: 12/29/2020 21:38       I have independently reviewed the above radiology studies  and reviewed the findings with the patient.   Recent Lab Findings: Lab Results  Component Value Date   WBC 14.0 (H) 09/27/2020   HGB 12.5 (L) 09/27/2020   HCT 37.2 (L) 09/27/2020   PLT 119 (L) 09/27/2020   GLUCOSE 188 (H) 09/27/2020   TRIG 69 09/25/2020   ALT 53 (H) 09/27/2020   AST 63 (H) 09/27/2020   NA 138 09/27/2020   K 4.2 09/27/2020   CL 110 09/27/2020   CREATININE 1.17 09/27/2020   BUN 26 (H) 09/27/2020   CO2 20 (L) 09/27/2020   TSH 0.213 (L) 09/26/2020   INR 1.3 (H)  09/26/2020   HGBA1C 6.5 (H) 09/26/2020     PFTs: Pending   Problem List: 1.4 cm left upper lobe pulmonary nodule 5 mm right middle lobe pulmonary nodule History of bladder cancer treated in 2016 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  The left upper lobe pulmonary nodule has an SUV max of 3 on PET/CT.  He also has mild hypermetabolic uptake in the right middle lobe nodule bilateral hilar nodes.  The right middle lobe nodule has minimal uptake on PET/CT.    He does not have many risk factors for primary lung cancer however he has had bladder cancer and a pituitary adenoma that has been removed.  I will need to speak with Dr. Valeta Harms to see his comfort level about performing a combination procedure.  I do think it may be in her best interest for him to undergo a navigational bronchoscopy and biopsy for definitive diagnosis prior to undergoing resection.  If the nodule is not a primary lung cancer that he will only require a wedge resection.  He has undergone pulmonary function testing in Dr. Juline Patch office, but I am not able to review the results.  I do think that he should undergo a stress test prior to any surgical invention.  I think that regardless of the biopsy results the nodule should be removed, but if this is a  primary lung cancer then we will just go straight to a lobectomy.      I  spent 40 minutes with  the patient face to face in counseling and coordination of care.    Lajuana Matte 01/15/2021 10:06 AM

## 2021-01-15 ENCOUNTER — Institutional Professional Consult (permissible substitution) (INDEPENDENT_AMBULATORY_CARE_PROVIDER_SITE_OTHER): Payer: Medicare Other | Admitting: Thoracic Surgery (Cardiothoracic Vascular Surgery)

## 2021-01-15 ENCOUNTER — Other Ambulatory Visit: Payer: Self-pay

## 2021-01-15 VITALS — BP 167/76 | HR 68 | Resp 20 | Ht 68.0 in | Wt 176.0 lb

## 2021-01-15 DIAGNOSIS — R911 Solitary pulmonary nodule: Secondary | ICD-10-CM

## 2021-01-20 ENCOUNTER — Other Ambulatory Visit: Payer: Self-pay | Admitting: *Deleted

## 2021-01-20 DIAGNOSIS — R911 Solitary pulmonary nodule: Secondary | ICD-10-CM

## 2021-01-20 LAB — PULMONARY FUNCTION TEST
FEF 25-75 Pre: 1.16 L/sec
FEF2575-%Pred-Pre: 53 %
FEV1-%Pred-Pre: 50 %
FEV1-Pre: 1.54 L
FEV1FVC-%Pred-Pre: 62 %
FEV6-%Pred-Pre: 84 %
FEV6-Pre: 3.32 L
FEV6FVC-%Pred-Pre: 106 %
FVC-%Pred-Pre: 80 %
FVC-Pre: 3.4 L
Pre FEV1/FVC ratio: 45 %
Pre FEV6/FVC Ratio: 100 %

## 2021-01-20 NOTE — Progress Notes (Unsigned)
pf

## 2021-01-22 ENCOUNTER — Other Ambulatory Visit (HOSPITAL_COMMUNITY)
Admission: RE | Admit: 2021-01-22 | Discharge: 2021-01-22 | Disposition: A | Discharge status: Home / Self Care | Payer: Medicare Other | Source: Ambulatory Visit | Attending: Thoracic Surgery (Cardiothoracic Vascular Surgery) | Admitting: Thoracic Surgery (Cardiothoracic Vascular Surgery)

## 2021-01-22 DIAGNOSIS — Z01812 Encounter for preprocedural laboratory examination: Secondary | ICD-10-CM | POA: Insufficient documentation

## 2021-01-22 DIAGNOSIS — Z20822 Contact with and (suspected) exposure to covid-19: Secondary | ICD-10-CM | POA: Diagnosis not present

## 2021-01-22 DIAGNOSIS — Z01818 Encounter for other preprocedural examination: Secondary | ICD-10-CM

## 2021-01-22 LAB — SARS CORONAVIRUS 2 (TAT 6-24 HRS): SARS Coronavirus 2: NEGATIVE

## 2021-01-26 ENCOUNTER — Ambulatory Visit (HOSPITAL_COMMUNITY)
Admission: RE | Admit: 2021-01-26 | Discharge: 2021-01-26 | Disposition: A | Discharge status: Home / Self Care | Payer: Medicare Other | Source: Ambulatory Visit | Attending: Thoracic Surgery (Cardiothoracic Vascular Surgery) | Admitting: Thoracic Surgery (Cardiothoracic Vascular Surgery)

## 2021-01-26 ENCOUNTER — Other Ambulatory Visit: Payer: Self-pay

## 2021-01-26 DIAGNOSIS — R059 Cough, unspecified: Secondary | ICD-10-CM | POA: Insufficient documentation

## 2021-01-26 DIAGNOSIS — R911 Solitary pulmonary nodule: Secondary | ICD-10-CM | POA: Insufficient documentation

## 2021-01-26 DIAGNOSIS — R0600 Dyspnea, unspecified: Secondary | ICD-10-CM | POA: Diagnosis not present

## 2021-01-26 LAB — PULMONARY FUNCTION TEST
DL/VA % pred: 79 %
DL/VA: 3.18 ml/min/mmHg/L
DLCO unc % pred: 77 %
DLCO unc: 18.17 ml/min/mmHg
FEF 25-75 Post: 2.85 L/sec
FEF 25-75 Pre: 3.01 L/sec
FEF2575-%Change-Post: -5 %
FEF2575-%Pred-Post: 143 %
FEF2575-%Pred-Pre: 151 %
FEV1-%Change-Post: 0 %
FEV1-%Pred-Post: 113 %
FEV1-%Pred-Pre: 114 %
FEV1-Post: 3.16 L
FEV1-Pre: 3.19 L
FEV1FVC-%Change-Post: 5 %
FEV1FVC-%Pred-Pre: 107 %
FEV6-%Change-Post: -6 %
FEV6-%Pred-Post: 105 %
FEV6-%Pred-Pre: 112 %
FEV6-Post: 3.81 L
FEV6-Pre: 4.06 L
FEV6FVC-%Change-Post: 0 %
FEV6FVC-%Pred-Post: 106 %
FEV6FVC-%Pred-Pre: 106 %
FVC-%Change-Post: -6 %
FVC-%Pred-Post: 99 %
FVC-%Pred-Pre: 105 %
FVC-Post: 3.84 L
FVC-Pre: 4.09 L
Post FEV1/FVC ratio: 82 %
Post FEV6/FVC ratio: 100 %
Pre FEV1/FVC ratio: 78 %
Pre FEV6/FVC Ratio: 99 %
RV % pred: 88 %
RV: 2.19 L
TLC % pred: 92 %
TLC: 6.16 L

## 2021-01-26 MED ORDER — ALBUTEROL SULFATE (2.5 MG/3ML) 0.083% IN NEBU
2.5000 mg | INHALATION_SOLUTION | Freq: Once | RESPIRATORY_TRACT | Status: AC
  Administered 2021-01-26: 2.5 mg via RESPIRATORY_TRACT

## 2021-01-27 ENCOUNTER — Telehealth: Payer: Self-pay | Admitting: Pulmonary Disease

## 2021-01-27 DIAGNOSIS — R911 Solitary pulmonary nodule: Secondary | ICD-10-CM | POA: Diagnosis present

## 2021-01-27 NOTE — Telephone Encounter (Signed)
PCCM:  Case discussed with thoracic surgery.  PFTs completed and reviewed.  Next best step has made decision for robotic assisted bronchoscopy and tissue sampling of the nodule before considerations for surgery.  Orders placed for bronchoscopy to be completed on 02/02/2021.  Garner Nash, DO [facility] 01/27/2021 10:07 AM

## 2021-01-27 NOTE — Telephone Encounter (Signed)
Pt already scheduled for 11/22 at 2:30.  Pt will go for covid test on 11/18.  Spoke to pt and gave him appt info.  Spoke to April at [facility] & she is going to have disk sent to [facility].

## 2021-01-29 ENCOUNTER — Other Ambulatory Visit: Payer: Self-pay | Admitting: Pulmonary Disease

## 2021-01-29 ENCOUNTER — Encounter (HOSPITAL_COMMUNITY): Payer: Self-pay | Admitting: Pulmonary Disease

## 2021-01-29 ENCOUNTER — Other Ambulatory Visit: Payer: Self-pay

## 2021-01-29 LAB — SARS CORONAVIRUS 2 (TAT 6-24 HRS): SARS Coronavirus 2: NEGATIVE

## 2021-01-29 NOTE — Progress Notes (Signed)
Eric [name] denies chest pain or shortness of breath. Patient denies having any s/s of Covid in his household.  Patient denies any known exposure to Covid.   PCP is Dr. Judd Lien.  I instructed patient to shower with antibiotic soap, if it is available.  Dry off with a clean towel. Do not put lotion, powder, cologne or deodorant or makeup.No jewelry or piercings. Men may shave their face and neck. Woman should not shave. No nail polish, artificial or acrylic nails. Wear clean clothes, brush your teeth. Glasses, contact lens,dentures or partials may not be worn in the OR. If you need to wear them, please bring a case for glasses, do not wear contacts or bring a case, the hospital does not have contact cases, dentures or partials will have to be removed , make sure they are clean, we will provide a denture cup to put them in. You will need some one to drive you home and a responsible person over the age of 64 to stay with you for the first 24 hours after surgery.

## 2021-02-02 ENCOUNTER — Ambulatory Visit (HOSPITAL_COMMUNITY): Payer: Medicare Other | Admitting: Certified Registered"

## 2021-02-02 ENCOUNTER — Other Ambulatory Visit: Payer: Self-pay

## 2021-02-02 ENCOUNTER — Ambulatory Visit (HOSPITAL_COMMUNITY): Payer: Medicare Other

## 2021-02-02 ENCOUNTER — Ambulatory Visit (HOSPITAL_COMMUNITY)
Admission: RE | Admit: 2021-02-02 | Discharge: 2021-02-02 | Disposition: A | Discharge status: Home / Self Care | Payer: Medicare Other | Attending: Pulmonary Disease | Admitting: Pulmonary Disease

## 2021-02-02 ENCOUNTER — Encounter (HOSPITAL_COMMUNITY): Payer: Self-pay | Admitting: Pulmonary Disease

## 2021-02-02 ENCOUNTER — Encounter (HOSPITAL_COMMUNITY)
Admission: RE | Disposition: A | Discharge status: Home / Self Care | Payer: Self-pay | Source: Home / Self Care | Attending: Pulmonary Disease

## 2021-02-02 DIAGNOSIS — J9601 Acute respiratory failure with hypoxia: Secondary | ICD-10-CM | POA: Diagnosis not present

## 2021-02-02 DIAGNOSIS — Z8616 Personal history of COVID-19: Secondary | ICD-10-CM | POA: Diagnosis not present

## 2021-02-02 DIAGNOSIS — Z419 Encounter for procedure for purposes other than remedying health state, unspecified: Secondary | ICD-10-CM

## 2021-02-02 DIAGNOSIS — Z8551 Personal history of malignant neoplasm of bladder: Secondary | ICD-10-CM | POA: Insufficient documentation

## 2021-02-02 DIAGNOSIS — R911 Solitary pulmonary nodule: Secondary | ICD-10-CM | POA: Diagnosis not present

## 2021-02-02 DIAGNOSIS — E119 Type 2 diabetes mellitus without complications: Secondary | ICD-10-CM | POA: Diagnosis not present

## 2021-02-02 DIAGNOSIS — Z87891 Personal history of nicotine dependence: Secondary | ICD-10-CM | POA: Insufficient documentation

## 2021-02-02 DIAGNOSIS — Z79899 Other long term (current) drug therapy: Secondary | ICD-10-CM | POA: Diagnosis not present

## 2021-02-02 DIAGNOSIS — K649 Unspecified hemorrhoids: Secondary | ICD-10-CM | POA: Diagnosis not present

## 2021-02-02 DIAGNOSIS — Z87442 Personal history of urinary calculi: Secondary | ICD-10-CM | POA: Diagnosis not present

## 2021-02-02 DIAGNOSIS — I10 Essential (primary) hypertension: Secondary | ICD-10-CM | POA: Diagnosis not present

## 2021-02-02 DIAGNOSIS — K5900 Constipation, unspecified: Secondary | ICD-10-CM | POA: Diagnosis not present

## 2021-02-02 DIAGNOSIS — I509 Heart failure, unspecified: Secondary | ICD-10-CM

## 2021-02-02 HISTORY — PX: BRONCHIAL BIOPSY: SHX5109

## 2021-02-02 HISTORY — PX: FIDUCIAL MARKER PLACEMENT: SHX6858

## 2021-02-02 HISTORY — PX: BRONCHIAL NEEDLE ASPIRATION BIOPSY: SHX5106

## 2021-02-02 HISTORY — DX: Unspecified osteoarthritis, unspecified site: M19.90

## 2021-02-02 HISTORY — DX: Polyneuropathy, unspecified: G62.9

## 2021-02-02 HISTORY — PX: BRONCHIAL WASHINGS: SHX5105

## 2021-02-02 HISTORY — DX: Acute embolism and thrombosis of unspecified deep veins of unspecified lower extremity: I82.409

## 2021-02-02 HISTORY — PX: VIDEO BRONCHOSCOPY WITH RADIAL ENDOBRONCHIAL ULTRASOUND: SHX6849

## 2021-02-02 HISTORY — PX: BRONCHIAL BRUSHINGS: SHX5108

## 2021-02-02 HISTORY — DX: Other complications of anesthesia, initial encounter: T88.59XA

## 2021-02-02 LAB — BASIC METABOLIC PANEL
Anion gap: 9 (ref 5–15)
BUN: 16 mg/dL (ref 8–23)
CO2: 20 mmol/L — ABNORMAL LOW (ref 22–32)
Calcium: 9.2 mg/dL (ref 8.9–10.3)
Chloride: 112 mmol/L — ABNORMAL HIGH (ref 98–111)
Creatinine, Ser: 1.08 mg/dL (ref 0.61–1.24)
GFR, Estimated: >60 mL/min (ref >60)
Glucose, Bld: 121 mg/dL — ABNORMAL HIGH (ref 70–99)
Potassium: 3.9 mmol/L (ref 3.5–5.1)
Sodium: 141 mmol/L (ref 135–145)

## 2021-02-02 LAB — CBC
HCT: 41.7 % (ref 39.0–52.0)
Hemoglobin: 13.2 g/dL (ref 13.0–17.0)
MCH: 29.5 pg (ref 26.0–34.0)
MCHC: 31.7 g/dL (ref 30.0–36.0)
MCV: 93.3 fL (ref 80.0–100.0)
Platelets: 135 10*3/uL — ABNORMAL LOW (ref 150–400)
RBC: 4.47 MIL/uL (ref 4.22–5.81)
RDW: 13.4 % (ref 11.5–15.5)
WBC: 5.6 10*3/uL (ref 4.0–10.5)
nRBC: 0 % (ref 0.0–0.2)

## 2021-02-02 LAB — GLUCOSE, CAPILLARY
Glucose-Capillary: 119 mg/dL — ABNORMAL HIGH (ref 70–99)
Glucose-Capillary: 128 mg/dL — ABNORMAL HIGH (ref 70–99)

## 2021-02-02 SURGERY — BRONCHOSCOPY, WITH BIOPSY USING ELECTROMAGNETIC NAVIGATION
Anesthesia: General | Laterality: Left

## 2021-02-02 MED ORDER — EPHEDRINE SULFATE-NACL 50-0.9 MG/10ML-% IV SOSY
PREFILLED_SYRINGE | INTRAVENOUS | Status: DC | PRN
  Administered 2021-02-02: 15 mg via INTRAVENOUS
  Administered 2021-02-02 (×2): 10 mg via INTRAVENOUS
  Administered 2021-02-02: 5 mg via INTRAVENOUS

## 2021-02-02 MED ORDER — LACTATED RINGERS IV SOLN: INTRAVENOUS | Status: DC

## 2021-02-02 MED ORDER — FENTANYL CITRATE (PF) 250 MCG/5ML IJ SOLN
INTRAMUSCULAR | Status: DC | PRN
  Administered 2021-02-02: 100 ug via INTRAVENOUS

## 2021-02-02 MED ORDER — LIDOCAINE 2% (20 MG/ML) 5 ML SYRINGE
INTRAMUSCULAR | Status: DC | PRN
  Administered 2021-02-02: 60 mg via INTRAVENOUS

## 2021-02-02 MED ORDER — ONDANSETRON HCL 4 MG/2ML IJ SOLN
INTRAMUSCULAR | Status: DC | PRN
  Administered 2021-02-02: 4 mg via INTRAVENOUS

## 2021-02-02 MED ORDER — PROPOFOL 10 MG/ML IV BOLUS
INTRAVENOUS | Status: DC | PRN
  Administered 2021-02-02: 80 mg via INTRAVENOUS

## 2021-02-02 MED ORDER — CHLORHEXIDINE GLUCONATE 0.12 % MT SOLN
15.0000 mL | Freq: Once | OROMUCOSAL | Status: AC
  Administered 2021-02-02: 15 mL via OROMUCOSAL
  Filled 2021-02-02 (×2): qty 15

## 2021-02-02 MED ORDER — ROCURONIUM BROMIDE 10 MG/ML (PF) SYRINGE
PREFILLED_SYRINGE | INTRAVENOUS | Status: DC | PRN
  Administered 2021-02-02: 50 mg via INTRAVENOUS

## 2021-02-02 MED ORDER — DEXAMETHASONE SODIUM PHOSPHATE 10 MG/ML IJ SOLN
INTRAMUSCULAR | Status: DC | PRN
  Administered 2021-02-02: 10 mg via INTRAVENOUS

## 2021-02-02 MED ORDER — SUGAMMADEX SODIUM 200 MG/2ML IV SOLN
INTRAVENOUS | Status: DC | PRN
  Administered 2021-02-02: 200 mg via INTRAVENOUS

## 2021-02-02 MED ORDER — ACETAMINOPHEN 500 MG PO TABS
1000.0000 mg | ORAL_TABLET | Freq: Once | ORAL | Status: AC
  Administered 2021-02-02: 1000 mg via ORAL
  Filled 2021-02-02: qty 2

## 2021-02-02 SURGICAL SUPPLY — 1 items: Superlock fiducial marker ×3 IMPLANT

## 2021-02-02 NOTE — H&P (Signed)
Synopsis: Referred in Oct 2022 for lung nodules by Curlene Labrum, MD   Subjective:    PATIENT ID: Eric [name] GENDER: male DOB: 1945-11-11, MRN: [id number]      Chief Complaint  Patient presents with   Consult      75 YO M, ho bladder cancer in 2013-2016, covid in July 2022, history of kidney stones, former smoker, started smoking back in high school and smoked for just a few years.  From a respiratory standpoint patient is doing well today.  He is slowly recovering from [place] in July.  He does feel more rundown than he had in the past but he is feeling better.  He had a CT scan of the chest which revealed a macrolobulated left upper lobe nodule.  He had a PET scan which showed low-level PET uptake as well as some mild PET uptake within the mediastinum however these nodes were also very small in size.  Recommending short-term CT scan follow-up.  Does not have a very significant smoking history but does have a history of bladder cancer.  02/03/2021: Here today for outpatient bronchoscopy.         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           Family History  Problem Relation Age of Onset   Hypercholesterolemia Father     Hypercholesterolemia Mother     Colon cancer Neg Hx     Urolithiasis Neg Hx     Sudden death Neg Hx     Lupus Neg Hx     Sickle cell trait Neg Hx             Past Surgical History:  Procedure Laterality Date   BIOPSY   09/29/2016    Procedure: BIOPSY;  Surgeon: Rogene Houston, MD;  [facility];  Service: Endoscopy;;  colon   BIOPSY   11/14/2019    Procedure: BIOPSY;  Surgeon: Rogene Houston, MD;  [facility];  Service: Endoscopy;;   BLADDER SURGERY        2013   CHOLECYSTECTOMY       COLONOSCOPY N/A  09/29/2016    Procedure: COLONOSCOPY;  Surgeon: Rogene Houston, MD;  [facility];  Service: Endoscopy;  Laterality: N/A;  12:00   COLONOSCOPY N/A 11/14/2019    Procedure: COLONOSCOPY;  Surgeon: Rogene Houston, MD;  [facility];  Service: Endoscopy;  Laterality: N/A;  125, per office, pt knows new arrival time   CRANIOTOMY N/A 04/24/2020    Procedure: 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;  Surgeon: Ashok Pall, MD;  Location: [facility];  Service: Neurosurgery;  Laterality: N/A;  3C/RM 20   ESOPHAGOGASTRODUODENOSCOPY N/A 11/14/2019    Procedure: ESOPHAGOGASTRODUODENOSCOPY (EGD);  Surgeon: Rogene Houston, MD;  [facility];  Service: Endoscopy;  Laterality: N/A;   knee athroscopy       TONSILLECTOMY        as child   TRANSPHENOIDAL APPROACH EXPOSURE N/A 04/24/2020    Procedure: TRANSPHENOIDAL APPROACH EXPOSURE;  Surgeon: Jerrell Belfast, MD;  Location: [facility];  Service: ENT;  Laterality: N/A;      Social History  Socioeconomic History   Marital status: Married      Spouse name: Not on file   Number of children: Not on file   Years of education: Not on file   Highest education level: Not on file  Occupational History   Not on file  Tobacco Use   Smoking status: Never   Smokeless tobacco: Never  Vaping Use   Vaping Use: Never used  Substance and Sexual Activity   Alcohol use: Not Currently   Drug use: No   Sexual activity: Not Currently  Other Topics Concern   Not on file  Social History Narrative   Not on file    Social Determinants of Health    Financial Resource Strain: Not on file  Food Insecurity: Not on file  Transportation Needs: Not on file  Physical Activity: Not on file  Stress: Not on file  Social Connections: Not on file  Intimate Partner Violence: Not on file      No Known Allergies          Outpatient Medications Prior to Visit  Medication Sig Dispense Refill   Ascorbic Acid  (VITAMIN C) 1000 MG tablet Take 1,000 mg by mouth in the morning and at bedtime.       B Complex-C (B-COMPLEX WITH VITAMIN C) tablet Take 1 tablet by mouth daily.       Cholecalciferol (VITAMIN D3) 50 MCG (2000 UT) TABS Take 2,000 Units by mouth daily.       Coenzyme Q10 (COQ10) 100 MG CAPS Take 100 mg by mouth daily.       dutasteride (AVODART) 0.5 MG capsule Take 1 capsule (0.5 mg total) by mouth daily. 90 capsule 3   gabapentin (NEURONTIN) 300 MG capsule Take 300 mg by mouth in the morning, at noon, and at bedtime.        Magnesium 400 MG TABS Take 400 mg by mouth daily.       Misc Natural Products (BLOOD SUGAR BALANCE) TABS Take 1 tablet by mouth in the morning and at bedtime. Glucocil       Multiple Vitamin (MULTIVITAMIN WITH MINERALS) TABS tablet Take 1 tablet by mouth daily.       Potassium 99 MG TABS Take 99 mg by mouth daily.       psyllium (METAMUCIL) 58.6 % powder Take 1 packet by mouth daily.       tamsulosin (FLOMAX) 0.4 MG CAPS capsule Take 1 capsule (0.4 mg total) by mouth in the morning and at bedtime. 60 capsule 11   temazepam (RESTORIL) 15 MG capsule Take 15 mg by mouth at bedtime as needed for sleep.       Zinc 50 MG TABS Take 50 mg by mouth daily.       amLODipine (NORVASC) 5 MG tablet Take 5 mg by mouth daily.       cefdinir (OMNICEF) 300 MG capsule Take 1 capsule (300 mg total) by mouth 2 (two) times daily. 8 capsule 0   doxycycline (VIBRA-TABS) 100 MG tablet Take 1 tablet (100 mg total) by mouth every 12 (twelve) hours. 8 tablet 0   predniSONE (DELTASONE) 50 MG tablet Take 1 tablet (50 mg total) by mouth 2 (two) times daily with a meal. 10 tablet 0    No facility-administered medications prior to visit.      Review of Systems  Constitutional:  Negative for chills, fever, malaise/fatigue and weight loss.  HENT:  Negative for hearing loss, sore throat and tinnitus.   Eyes:  Negative for blurred vision and double vision.  Respiratory:  Negative for cough, hemoptysis,  sputum production, shortness of breath, wheezing and stridor.   Cardiovascular:  Negative for chest pain, palpitations, orthopnea, leg swelling and PND.  Gastrointestinal:  Negative for abdominal pain, constipation, diarrhea, heartburn, nausea and vomiting.  Genitourinary:  Negative for dysuria, hematuria and urgency.  Musculoskeletal:  Negative for joint pain and myalgias.  Skin:  Negative for itching and rash.  Neurological:  Negative for dizziness, tingling, weakness and headaches.  Endo/Heme/Allergies:  Negative for environmental allergies. Does not bruise/bleed easily.  Psychiatric/Behavioral:  Negative for depression. The patient is not nervous/anxious and does not have insomnia.   All other systems reviewed and are negative.      Objective:   General appearance: 75 y.o., male, NAD, conversant  Eyes: anicteric sclerae, moist conjunctivae; no lid-lag; PERRLA, tracking appropriately HENT: NCAT; oropharynx, MMM, no mucosal ulcerations; normal hard and soft palate Neck: Trachea midline; FROM, supple, lymphadenopathy, no JVD Lungs: CTAB, no crackles, no wheeze, with normal respiratory effort and no intercostal retractions CV: RRR, S1, S2, no MRGs  Abdomen: Soft, non-tender; non-distended, BS present  Extremities: No peripheral edema, radial and DP pulses present bilaterally  Skin: Normal temperature, turgor and texture; no rash Psych: Appropriate affect Neuro: Alert and oriented to person and place, no focal deficit            Vitals:    12/21/20 1431  BP: 120/78  Pulse: 71  Temp: 97.8 F (36.6 C)  TempSrc: Oral  SpO2: 99%  Weight: 176 lb (79.8 kg)  Height: 5\' 8"  (1.727 m)    99% on RA    BMI Readings from Last 3 Encounters:  12/21/20 26.76 kg/m  09/26/20 25.24 kg/m  08/12/20 26.26 kg/m       Wt Readings from Last 3 Encounters:  12/21/20 176 lb (79.8 kg)  09/26/20 175 lb 14.8 oz (79.8 kg)  04/24/20 182 lb 15.7 oz (83 kg)        CBC Labs (Brief)           Component Value Date/Time    WBC 14.0 (H) 09/27/2020 0543    RBC 4.04 (L) 09/27/2020 0543    HGB 12.5 (L) 09/27/2020 0543    HCT 37.2 (L) 09/27/2020 0543    PLT 119 (L) 09/27/2020 0543    MCV 92.1 09/27/2020 0543    MCH 30.9 09/27/2020 0543    MCHC 33.6 09/27/2020 0543    RDW 14.1 09/27/2020 0543    LYMPHSABS 1.4 09/27/2020 0543    MONOABS 0.5 09/27/2020 0543    EOSABS 0.0 09/27/2020 0543    BASOSABS 0.0 09/27/2020 0543        Chest Imaging: CT chest August 2022: Upper lobe left-sided lobulated nodule concerning for malignancy. The patient's images have been independently reviewed by me.     11/10/2020 nuclear medicine PET scan: Patient has low-level PET uptake within the lesion.  Also small amount of hypermetabolic adenopathy within the chest. The patient's images have been independently reviewed by me.       Pulmonary Functions Testing Results: No flowsheet data found.   FeNO:    Pathology:    Echocardiogram:    Heart Catheterization:     Assessment & Plan:        ICD-10-CM    1. Lung nodule  R91.1 CT Super D Chest Wo Contrast     2. Adenopathy  R59.9       3. History of COVID-19  Z86.16         Discussion:   75 year old gentleman, history of COVID-19, former smoker, history of bladder cancer in 2013 in 2016.  Found to have incidental left upper lobe lobulated lung nodule concerning for malignancy  Plan: Here today for planned outpatient bronchoscopy. Discussed risk benefits and alternatives. Patient is agreeable to proceed.  No barriers at this time.  Garner Nash, DO [facility] 02/02/2021 1:50 PM

## 2021-02-02 NOTE — Anesthesia Procedure Notes (Signed)
Procedure Name: Intubation Date/Time: 02/02/2021 2:03 PM Performed by: Lance Coon, CRNA Pre-anesthesia Checklist: Patient identified, Emergency Drugs available, Suction available, Patient being monitored and Timeout performed Patient Re-evaluated:Patient Re-evaluated prior to induction Oxygen Delivery Method: Circle system utilized Preoxygenation: Pre-oxygenation with 100% oxygen Induction Type: IV induction Ventilation: Mask ventilation without difficulty Laryngoscope Size: Miller and 3 Grade View: Grade I Tube type: Oral Tube size: 8.5 mm Number of attempts: 1 Airway Equipment and Method: Stylet Placement Confirmation: ETT inserted through vocal cords under direct vision, positive ETCO2 and breath sounds checked- equal and bilateral Secured at: 21 cm Tube secured with: Tape Dental Injury: Teeth and Oropharynx as per pre-operative assessment

## 2021-02-02 NOTE — Interval H&P Note (Signed)
History and Physical Interval Note:  02/02/2021 1:51 PM  Eric [name]  has presented today for surgery, with the diagnosis of lung nodule.  The various methods of treatment have been discussed with the patient and family. After consideration of risks, benefits and other options for treatment, the patient has consented to  Procedure(s) with comments: ROBOTIC ASSISTED NAVIGATIONAL BRONCHOSCOPY (Left) - ION w/ CIOS w/ Fiducial placement as a surgical intervention.  The patient's history has been reviewed, patient examined, no change in status, stable for surgery.  I have reviewed the patient's chart and labs.  Questions were answered to the patient's satisfaction.     [place]

## 2021-02-02 NOTE — Anesthesia Postprocedure Evaluation (Signed)
Anesthesia Post Note  Patient: Eric [name]  Procedure(s) Performed: ROBOTIC ASSISTED NAVIGATIONAL BRONCHOSCOPY (Left) RADIAL ENDOBRONCHIAL ULTRASOUND BRONCHIAL BRUSHINGS BRONCHIAL BIOPSIES BRONCHIAL NEEDLE ASPIRATION BIOPSIES FIDUCIAL MARKER PLACEMENT BRONCHIAL WASHINGS     Patient location during evaluation: PACU Anesthesia Type: General Level of consciousness: awake and alert Pain management: pain level controlled Vital Signs Assessment: post-procedure vital signs reviewed and stable Respiratory status: spontaneous breathing, nonlabored ventilation and respiratory function stable Cardiovascular status: blood pressure returned to baseline and stable Postop Assessment: no apparent nausea or vomiting Anesthetic complications: no   No notable events documented.  Last Vitals:  Vitals:   02/02/21 1525 02/02/21 1540  BP: 129/67 128/66  Pulse: 78 75  Resp: 20 18  Temp:  36.7 C  SpO2: 93% 93%    Last Pain:  Vitals:   02/02/21 1540  TempSrc:   PainSc: 0-No pain                 Nathalie Cavendish,W. EDMOND

## 2021-02-02 NOTE — Progress Notes (Signed)
Patient up to recliner awaiting chest x ray results

## 2021-02-02 NOTE — Anesthesia Preprocedure Evaluation (Addendum)
Anesthesia Evaluation  Patient identified by MRN, date of birth, ID band Patient awake    Reviewed: Allergy & Precautions, H&P , NPO status , Patient's Chart, lab work & pertinent test results  Airway Mallampati: II  TM Distance: >3 FB Neck ROM: Full    Dental no notable dental hx. (+) Teeth Intact, Dental Advisory Given   Pulmonary neg pulmonary ROS, former smoker,    Pulmonary exam normal breath sounds clear to auscultation       Cardiovascular hypertension, Pt. on medications  Rhythm:Regular Rate:Normal     Neuro/Psych negative neurological ROS  negative psych ROS   GI/Hepatic negative GI ROS, Neg liver ROS,   Endo/Other  diabetes, Well Controlled  Renal/GU negative Renal ROS  negative genitourinary   Musculoskeletal  (+) Arthritis , Osteoarthritis,    Abdominal   Peds  Hematology negative hematology ROS (+)   Anesthesia Other Findings   Reproductive/Obstetrics negative OB ROS                            Anesthesia Physical Anesthesia Plan  ASA: 2  Anesthesia Plan: General   Post-op Pain Management: Tylenol PO (pre-op)   Induction: Intravenous  PONV Risk Score and Plan: 3 and Ondansetron, Dexamethasone and Treatment may vary due to age or medical condition  Airway Management Planned: Oral ETT  Additional Equipment:   Intra-op Plan:   Post-operative Plan: Extubation in OR  Informed Consent: I have reviewed the patients History and Physical, chart, labs and discussed the procedure including the risks, benefits and alternatives for the proposed anesthesia with the patient or authorized representative who has indicated his/her understanding and acceptance.     Dental advisory given  Plan Discussed with: CRNA  Anesthesia Plan Comments:         Anesthesia Quick Evaluation

## 2021-02-02 NOTE — Discharge Instructions (Addendum)
Flexible Bronchoscopy, Care After This sheet gives you information about how to care for yourself after your test. Your doctor may also give you more specific instructions. If you have problems or questions, contact your doctor. Follow these instructions at home: Eating and drinking Do not eat or drink anything (not even water) for 2 hours after your test, or until your numbing medicine (local anesthetic) wears off. When your numbness is gone and your cough and gag reflexes have come back, you may: Eat only soft foods. Slowly drink liquids. The day after the test, go back to your normal diet. Driving Do not drive for 24 hours if you were given a medicine to help you relax (sedative). Do not drive or use heavy machinery while taking prescription pain medicine. General instructions  Take over-the-counter and prescription medicines only as told by your doctor. Return to your normal activities as told. Ask what activities are safe for you. Do not use any products that have nicotine or tobacco in them. This includes cigarettes and e-cigarettes. If you need help quitting, ask your doctor. Keep all follow-up visits as told by your doctor. This is important. It is very important if you had a tissue sample (biopsy) taken. Get help right away if: You have shortness of breath that gets worse. You get light-headed. You feel like you are going to pass out (faint). You have chest pain. You cough up: More than a little blood. More blood than before. Summary Do not eat or drink anything (not even water) for 2 hours after your test, or until your numbing medicine wears off. Do not use cigarettes. Do not use e-cigarettes. Get help right away if you have chest pain.  This information is not intended to replace advice given to you by your health care provider. Make sure you discuss any questions you have with your health care provider. Document Released: 12/26/2008 Document Revised: 02/10/2017 Document  Reviewed: 03/18/2016 Elsevier Patient Education  2020 [employer].

## 2021-02-02 NOTE — Transfer of Care (Signed)
Immediate Anesthesia Transfer of Care Note  Patient: Eric [name]  Procedure(s) Performed: ROBOTIC ASSISTED NAVIGATIONAL BRONCHOSCOPY (Left) RADIAL ENDOBRONCHIAL ULTRASOUND BRONCHIAL BRUSHINGS BRONCHIAL BIOPSIES BRONCHIAL NEEDLE ASPIRATION BIOPSIES FIDUCIAL MARKER PLACEMENT BRONCHIAL WASHINGS  Patient Location: PACU  Anesthesia Type:General  Level of Consciousness: drowsy and patient cooperative  Airway & Oxygen Therapy: Patient Spontanous Breathing  Post-op Assessment: Report given to RN and Post -op Vital signs reviewed and stable  Post vital signs: Reviewed and stable  Last Vitals:  Vitals Value Taken Time  BP    Temp    Pulse 83 02/02/21 1512  Resp 24 02/02/21 1512  SpO2 96 % 02/02/21 1512  Vitals shown include unvalidated device data.  Last Pain:  Vitals:   02/02/21 1235  TempSrc:   PainSc: 0-No pain         Complications: No notable events documented.

## 2021-02-02 NOTE — Op Note (Signed)
Video Bronchoscopy with Robotic Assisted Bronchoscopic Navigation   Date of Operation: 02/02/2021   Pre-op Diagnosis: Left upper lobe lung nodule  Post-op Diagnosis: Left upper lobe lung nodule  Surgeon: Garner Nash, DO  Assistants: None  Anesthesia: General endotracheal anesthesia  Operation: Flexible video fiberoptic bronchoscopy with robotic assistance and biopsies.  Estimated Blood Loss: Minimal  Complications: None  Indications and History: Eric [name] is a 75 y.o. male with history of left upper lobe lung nodule, history of bladder cancer. The risks, benefits, complications, treatment options and expected outcomes were discussed with the patient.  The possibilities of pneumothorax, pneumonia, reaction to medication, pulmonary aspiration, perforation of a viscus, bleeding, failure to diagnose a condition and creating a complication requiring transfusion or operation were discussed with the patient who freely signed the consent.    Description of Procedure: The patient was seen in the Preoperative Area, was examined and was deemed appropriate to proceed.  The patient was taken to [facility] endoscopy room 3, identified as Eric [name] and the procedure verified as Flexible Video Fiberoptic Bronchoscopy.  A Time Out was held and the above information confirmed.   Prior to the date of the procedure a high-resolution CT scan of the chest was performed. Utilizing ION software program a virtual tracheobronchial tree was generated to allow the creation of distinct navigation pathways to the patient's parenchymal abnormalities. After being taken to the operating room general anesthesia was initiated and the patient  was orally intubated. The video fiberoptic bronchoscope was introduced via the endotracheal tube and a general inspection was performed which showed normal right and left lung anatomy, aspiration of the bilateral mainstems was completed to remove any remaining  secretions. Robotic catheter inserted into patient's endotracheal tube.   Target #1 left upper lobe lung nodule: The distinct navigation pathways prepared prior to this procedure were then utilized to navigate to patient's lesion identified on CT scan. The robotic catheter was secured into place and the vision probe was withdrawn.  Lesion location was approximated using fluoroscopy, radial endobronchial ultrasound, and a three-dimensional cone beam CT imaging for peripheral targeting. Under fluoroscopic guidance transbronchial needle brushings, transbronchial needle biopsies, and transbronchial forceps biopsies were performed to be sent for cytology and pathology. A bronchioalveolar lavage was performed in the left upper lobe and sent for cytology.  At the end of the procedure a general airway inspection was performed and there was no evidence of active bleeding. The bronchoscope was removed.  The patient tolerated the procedure well. There was no significant blood loss and there were no obvious complications. A post-procedural chest x-ray is pending.  Samples Target #1: 1. Transbronchial needle brushings from left upper lobe 2. Transbronchial Wang needle biopsies from left upper lobe 3. Transbronchial forceps biopsies from left upper lobe 4. Bronchoalveolar lavage from left upper lobe  Plans:  The patient will be discharged from the PACU to home when recovered from anesthesia and after chest x-ray is reviewed. We will review the cytology, pathology and microbiology results with the patient when they become available. Outpatient followup will be with Garner Nash, DO.   Garner Nash, DO [facility] 02/02/2021 3:12 PM

## 2021-02-03 LAB — CYTOLOGY - NON PAP

## 2021-02-05 ENCOUNTER — Encounter (HOSPITAL_COMMUNITY): Payer: Self-pay | Admitting: Pulmonary Disease

## 2021-02-09 LAB — CYTOLOGY - NON PAP

## 2021-02-11 ENCOUNTER — Telehealth: Payer: Self-pay | Admitting: Pulmonary Disease

## 2021-02-11 NOTE — Telephone Encounter (Signed)
PCCM:  Called and spoke with the patient regarding his left upper lobe nodule biopsy.  Biopsy results were suspicious for malignancy with atypical cells.  None of the tissue to characterize further.  I explained that I would reach out to Dr. Kipp Brood and discuss next steps.  Eric Nash, DO [facility] 02/11/2021 12:07 PM

## 2021-02-23 DIAGNOSIS — M25572 Pain in left ankle and joints of left foot: Secondary | ICD-10-CM | POA: Diagnosis not present

## 2021-02-23 DIAGNOSIS — M79672 Pain in left foot: Secondary | ICD-10-CM | POA: Diagnosis not present

## 2021-03-29 DIAGNOSIS — H2513 Age-related nuclear cataract, bilateral: Secondary | ICD-10-CM | POA: Diagnosis not present

## 2021-03-29 DIAGNOSIS — H5203 Hypermetropia, bilateral: Secondary | ICD-10-CM | POA: Diagnosis not present

## 2021-03-29 DIAGNOSIS — H43813 Vitreous degeneration, bilateral: Secondary | ICD-10-CM | POA: Diagnosis not present

## 2021-03-29 DIAGNOSIS — H52223 Regular astigmatism, bilateral: Secondary | ICD-10-CM | POA: Diagnosis not present

## 2021-03-29 DIAGNOSIS — H524 Presbyopia: Secondary | ICD-10-CM | POA: Diagnosis not present

## 2021-03-29 DIAGNOSIS — H31113 Age-related choroidal atrophy, bilateral: Secondary | ICD-10-CM | POA: Diagnosis not present

## 2021-03-29 DIAGNOSIS — E119 Type 2 diabetes mellitus without complications: Secondary | ICD-10-CM | POA: Diagnosis not present

## 2021-03-30 DIAGNOSIS — I10 Essential (primary) hypertension: Secondary | ICD-10-CM | POA: Diagnosis not present

## 2021-03-30 DIAGNOSIS — E7800 Pure hypercholesterolemia, unspecified: Secondary | ICD-10-CM | POA: Diagnosis not present

## 2021-03-30 DIAGNOSIS — Z1329 Encounter for screening for other suspected endocrine disorder: Secondary | ICD-10-CM | POA: Diagnosis not present

## 2021-03-30 DIAGNOSIS — C679 Malignant neoplasm of bladder, unspecified: Secondary | ICD-10-CM | POA: Diagnosis not present

## 2021-03-30 DIAGNOSIS — E1165 Type 2 diabetes mellitus with hyperglycemia: Secondary | ICD-10-CM | POA: Diagnosis not present

## 2021-03-30 DIAGNOSIS — E7801 Familial hypercholesterolemia: Secondary | ICD-10-CM | POA: Diagnosis not present

## 2021-03-30 DIAGNOSIS — E1169 Type 2 diabetes mellitus with other specified complication: Secondary | ICD-10-CM | POA: Diagnosis not present

## 2021-03-30 DIAGNOSIS — R7989 Other specified abnormal findings of blood chemistry: Secondary | ICD-10-CM | POA: Diagnosis not present

## 2021-04-06 DIAGNOSIS — G4700 Insomnia, unspecified: Secondary | ICD-10-CM | POA: Diagnosis not present

## 2021-04-06 DIAGNOSIS — R911 Solitary pulmonary nodule: Secondary | ICD-10-CM | POA: Diagnosis not present

## 2021-04-06 DIAGNOSIS — D696 Thrombocytopenia, unspecified: Secondary | ICD-10-CM | POA: Diagnosis not present

## 2021-04-06 DIAGNOSIS — E1169 Type 2 diabetes mellitus with other specified complication: Secondary | ICD-10-CM | POA: Diagnosis not present

## 2021-04-06 DIAGNOSIS — Z6826 Body mass index (BMI) 26.0-26.9, adult: Secondary | ICD-10-CM | POA: Diagnosis not present

## 2021-04-06 DIAGNOSIS — I10 Essential (primary) hypertension: Secondary | ICD-10-CM | POA: Diagnosis not present

## 2021-04-06 DIAGNOSIS — E236 Other disorders of pituitary gland: Secondary | ICD-10-CM | POA: Diagnosis not present

## 2021-04-06 DIAGNOSIS — I700 Atherosclerosis of aorta: Secondary | ICD-10-CM | POA: Diagnosis not present

## 2021-04-07 ENCOUNTER — Telehealth: Payer: Self-pay | Admitting: Pulmonary Disease

## 2021-04-08 ENCOUNTER — Other Ambulatory Visit: Payer: Self-pay | Admitting: Thoracic Surgery (Cardiothoracic Vascular Surgery)

## 2021-04-08 DIAGNOSIS — R911 Solitary pulmonary nodule: Secondary | ICD-10-CM

## 2021-04-08 DIAGNOSIS — E1344 Other specified diabetes mellitus with diabetic amyotrophy: Secondary | ICD-10-CM

## 2021-04-08 NOTE — Telephone Encounter (Signed)
Will close encounter

## 2021-04-08 NOTE — Progress Notes (Unsigned)
nur

## 2021-04-08 NOTE — Telephone Encounter (Signed)
Routing to Dr. Valeta Harms for him to review. Please advise.

## 2021-04-12 ENCOUNTER — Telehealth (HOSPITAL_COMMUNITY): Payer: Self-pay

## 2021-04-12 NOTE — Telephone Encounter (Signed)
Spoke with the patient, detailed instructions given. He stated that he would be here for his test. Asked to call back with any questions. S.Georgeana Oertel EMTP 

## 2021-04-15 ENCOUNTER — Other Ambulatory Visit: Payer: Self-pay

## 2021-04-15 ENCOUNTER — Ambulatory Visit (HOSPITAL_COMMUNITY): Payer: Medicare Other | Attending: Cardiovascular Disease

## 2021-04-15 VITALS — Ht 70.0 in | Wt 177.0 lb

## 2021-04-15 DIAGNOSIS — Z01810 Encounter for preprocedural cardiovascular examination: Secondary | ICD-10-CM

## 2021-04-15 DIAGNOSIS — E1344 Other specified diabetes mellitus with diabetic amyotrophy: Secondary | ICD-10-CM | POA: Diagnosis not present

## 2021-04-15 DIAGNOSIS — R911 Solitary pulmonary nodule: Secondary | ICD-10-CM | POA: Insufficient documentation

## 2021-04-15 DIAGNOSIS — Z01818 Encounter for other preprocedural examination: Secondary | ICD-10-CM | POA: Insufficient documentation

## 2021-04-15 LAB — MYOCARDIAL PERFUSION IMAGING
Base ST Depression (mm): 0 mm
LV dias vol: 65 mL (ref 62–150)
LV sys vol: 20 mL
Nuc Stress EF: 70 %
Peak HR: 83 {beats}/min
Rest HR: 63 {beats}/min
Rest Nuclear Isotope Dose: 10.5 mCi
SDS: 0
SRS: 0
SSS: 0
ST Depression (mm): 0 mm
Stress Nuclear Isotope Dose: 31.7 mCi
TID: 1.04

## 2021-04-15 MED ORDER — REGADENOSON 0.4 MG/5ML IV SOLN
0.4000 mg | Freq: Once | INTRAVENOUS | Status: AC
  Administered 2021-04-15: 0.4 mg via INTRAVENOUS

## 2021-04-15 MED ORDER — TECHNETIUM TC 99M TETROFOSMIN IV KIT
31.7000 | PACK | Freq: Once | INTRAVENOUS | Status: AC | PRN
  Administered 2021-04-15: 31.7 via INTRAVENOUS
  Filled 2021-04-15: qty 32

## 2021-04-15 MED ORDER — TECHNETIUM TC 99M TETROFOSMIN IV KIT
10.5000 | PACK | Freq: Once | INTRAVENOUS | Status: AC | PRN
  Administered 2021-04-15: 10.5 via INTRAVENOUS
  Filled 2021-04-15: qty 11

## 2021-04-23 ENCOUNTER — Ambulatory Visit: Payer: Medicare Other | Admitting: Thoracic Surgery (Cardiothoracic Vascular Surgery)

## 2021-04-23 ENCOUNTER — Ambulatory Visit (INDEPENDENT_AMBULATORY_CARE_PROVIDER_SITE_OTHER): Payer: Medicare Other | Admitting: Thoracic Surgery (Cardiothoracic Vascular Surgery)

## 2021-04-23 ENCOUNTER — Other Ambulatory Visit: Payer: Self-pay | Admitting: Thoracic Surgery (Cardiothoracic Vascular Surgery)

## 2021-04-23 ENCOUNTER — Other Ambulatory Visit: Payer: Self-pay

## 2021-04-23 VITALS — BP 156/73 | HR 73 | Resp 20 | Ht 70.0 in | Wt 175.0 lb

## 2021-04-23 DIAGNOSIS — R911 Solitary pulmonary nodule: Secondary | ICD-10-CM | POA: Diagnosis not present

## 2021-04-26 NOTE — Progress Notes (Signed)
° °   °  [address]       ,[place] [address]             [phone]        Niccolo L Richardson Jr [place] Medical Record #[id number] Date of Birth: 1945-08-07  Referring: Garner Nash, DO Primary Care: Curlene Labrum, MD Primary Cardiologist:None  Reason for visit:   follow-up  History of Present Illness:     76 year old male presents in follow-up to discuss surgical management of the left upper lobe pulmonary nodule.  He has undergone navigational bronchoscopy with biopsy which was nondiagnostic in November of last year.  Remains hesitant to proceed with any surgical intervention.  Physical Exam: BP (!) 156/73    Pulse 73    Resp 20    Ht 5\' 10"  (1.778 m)    Wt 175 lb (79.4 kg)    SpO2 97% Comment: RA   BMI 25.11 kg/m   Alert NAD Easy work of breathing Abdomen is nondistended Extremities are warm      Assessment / Plan:   76 year old male with a left upper lobe pulmonary nodule concerning for primary lung cancer.  Biopsy results were nondiagnostic.  He remains hesitant for any surgical procedures.  His last cross-sectional imaging was in October of last year thus I recommended that we obtain a new CT scan.  He states that if it is unchanged he would like to continue observation however I expressed my concern that given its original size it may still be a primary lung cancer.  We will see him in follow-up after CT scan is completed.   Lajuana Matte 04/26/2021 9:56 AM

## 2021-04-28 ENCOUNTER — Other Ambulatory Visit (HOSPITAL_COMMUNITY): Payer: Self-pay | Admitting: Neurosurgery

## 2021-04-28 DIAGNOSIS — D352 Benign neoplasm of pituitary gland: Secondary | ICD-10-CM

## 2021-05-10 ENCOUNTER — Other Ambulatory Visit: Payer: Self-pay

## 2021-05-10 ENCOUNTER — Ambulatory Visit
Admission: RE | Admit: 2021-05-10 | Discharge: 2021-05-10 | Disposition: A | Discharge status: Home / Self Care | Payer: Medicare Other | Source: Ambulatory Visit | Attending: Thoracic Surgery (Cardiothoracic Vascular Surgery) | Admitting: Thoracic Surgery (Cardiothoracic Vascular Surgery)

## 2021-05-10 DIAGNOSIS — R911 Solitary pulmonary nodule: Secondary | ICD-10-CM | POA: Diagnosis not present

## 2021-05-10 DIAGNOSIS — R918 Other nonspecific abnormal finding of lung field: Secondary | ICD-10-CM | POA: Diagnosis not present

## 2021-05-13 ENCOUNTER — Other Ambulatory Visit: Payer: Self-pay

## 2021-05-13 ENCOUNTER — Ambulatory Visit (HOSPITAL_COMMUNITY)
Admission: RE | Admit: 2021-05-13 | Discharge: 2021-05-13 | Disposition: A | Discharge status: Home / Self Care | Payer: Medicare Other | Source: Ambulatory Visit | Attending: Neurosurgery | Admitting: Neurosurgery

## 2021-05-13 DIAGNOSIS — D352 Benign neoplasm of pituitary gland: Secondary | ICD-10-CM | POA: Diagnosis not present

## 2021-05-13 DIAGNOSIS — Z87828 Personal history of other (healed) physical injury and trauma: Secondary | ICD-10-CM | POA: Diagnosis not present

## 2021-05-13 DIAGNOSIS — D496 Neoplasm of unspecified behavior of brain: Secondary | ICD-10-CM | POA: Diagnosis not present

## 2021-05-13 DIAGNOSIS — U071 COVID-19: Secondary | ICD-10-CM | POA: Diagnosis not present

## 2021-05-13 DIAGNOSIS — I6782 Cerebral ischemia: Secondary | ICD-10-CM | POA: Diagnosis not present

## 2021-05-13 MED ORDER — GADOBUTROL 1 MMOL/ML IV SOLN
8.0000 mL | Freq: Once | INTRAVENOUS | Status: AC | PRN
  Administered 2021-05-13: 8 mL via INTRAVENOUS

## 2021-05-14 ENCOUNTER — Other Ambulatory Visit (HOSPITAL_COMMUNITY): Payer: Medicare Other

## 2021-05-14 ENCOUNTER — Ambulatory Visit (INDEPENDENT_AMBULATORY_CARE_PROVIDER_SITE_OTHER): Payer: Medicare Other | Admitting: Thoracic Surgery (Cardiothoracic Vascular Surgery)

## 2021-05-14 DIAGNOSIS — R911 Solitary pulmonary nodule: Secondary | ICD-10-CM | POA: Diagnosis not present

## 2021-05-14 NOTE — Progress Notes (Signed)
?   ?  [address] ?      [place] [address] ?            [phone]      ? ?Patient: Home ?Provider: Office ?Consent for Telemedicine visit obtained. ? ?Today?s visit was completed via a real-time telehealth (see specific modality noted below). The patient/authorized person provided oral consent at the time of the visit to engage in a telemedicine encounter with the present provider at [facility]. The patient/authorized person was informed of the potential benefits, limitations, and risks of telemedicine. The patient/authorized person expressed understanding that the laws that protect confidentiality also apply to telemedicine. The patient/authorized person acknowledged understanding that telemedicine does not provide emergency services and that he or she would need to call 911 or proceed to the nearest hospital for help if such a need arose. ? ? Total time spent in the clinical discussion 10 minutes. ? Telehealth Modality: Phone visit (audio only) ? ?I had a telephone visit with Mr. [name].  I reviewed the cross-sectional imaging with him today.  The nodule is essentially unchanged.  He remains undecided as to whether or not he wants to proceed with surgery.  I will bring him in in 2 weeks for discussion. ? ?

## 2021-05-28 ENCOUNTER — Telehealth: Payer: Medicare Other | Admitting: Thoracic Surgery (Cardiothoracic Vascular Surgery)

## 2021-05-28 ENCOUNTER — Ambulatory Visit (INDEPENDENT_AMBULATORY_CARE_PROVIDER_SITE_OTHER): Payer: Medicare Other | Admitting: Thoracic Surgery (Cardiothoracic Vascular Surgery)

## 2021-05-28 ENCOUNTER — Other Ambulatory Visit: Payer: Self-pay

## 2021-05-28 VITALS — BP 150/72 | HR 72 | Resp 20 | Ht 70.0 in | Wt 175.0 lb

## 2021-05-28 DIAGNOSIS — R911 Solitary pulmonary nodule: Secondary | ICD-10-CM | POA: Diagnosis not present

## 2021-05-28 NOTE — Progress Notes (Signed)
? ?   ?  [address] ?      [place] [address] ?            [phone]       ? ?Eric [name] ?[place] Record #[id number] ?Date of Birth: [birth year] ? ?Referring: Garner Nash, DO ?Primary Care: Curlene Labrum, MD ?Primary Cardiologist:None ? ?Reason for visit:   follow-up ? ?History of Present Illness:     ?Eric [name] presents to discuss CT scan findings.  The nodule is essentially unchanged.  From a respiratory standpoint he has no complaints. ? ?Physical Exam: ?BP (!) 150/72   Pulse 72   Resp 20   Ht '5\' 10"'$  (1.778 m)   Wt 175 lb (79.4 kg)   SpO2 99% Comment: RA  BMI 25.11 kg/m?  ? ?Alert NAD ?Easy work of breathing ?Abdomen, ND ?No peripheral edema ? ? ?Diagnostic Studies & Laboratory data: ?CT chest: ?Lungs/Pleura: Similar appearance of a bilobed nodule in the left ?upper lobe with each nodular component measuring approximately 6 mm. ?A biopsy clip is noted adjacent to this nodule. Similar appearance ?of right middle lobe nodule measuring approximately 5 mm. No new ?nodule. No focal consolidation, pleural effusion, or pneumothorax. ?The central airways are patent. ? ?Assessment / [name]:   ?76 year old male with left upper lobe pulmonary nodule that remains unchanged.  He underwent a nondiagnostic biopsy in November 2022.  He remains hesitant to proceed with any surgical resection and would like to follow this up and several months.  I will speak with Dr. Valeta Harms about repeating his imaging in another 3 to 6 months. ? ? ?Eric [name] ?05/28/2021 7:42 PM ? ? ? ? ? ? ?

## 2021-06-01 DIAGNOSIS — D352 Benign neoplasm of pituitary gland: Secondary | ICD-10-CM | POA: Diagnosis not present

## 2021-06-01 DIAGNOSIS — Z6825 Body mass index (BMI) 25.0-25.9, adult: Secondary | ICD-10-CM | POA: Diagnosis not present

## 2021-06-17 DIAGNOSIS — E1169 Type 2 diabetes mellitus with other specified complication: Secondary | ICD-10-CM | POA: Diagnosis not present

## 2021-06-17 DIAGNOSIS — Z131 Encounter for screening for diabetes mellitus: Secondary | ICD-10-CM | POA: Diagnosis not present

## 2021-06-24 DIAGNOSIS — R911 Solitary pulmonary nodule: Secondary | ICD-10-CM | POA: Diagnosis not present

## 2021-06-24 DIAGNOSIS — E1169 Type 2 diabetes mellitus with other specified complication: Secondary | ICD-10-CM | POA: Diagnosis not present

## 2021-06-24 DIAGNOSIS — G4700 Insomnia, unspecified: Secondary | ICD-10-CM | POA: Diagnosis not present

## 2021-06-24 DIAGNOSIS — I700 Atherosclerosis of aorta: Secondary | ICD-10-CM | POA: Diagnosis not present

## 2021-06-24 DIAGNOSIS — D696 Thrombocytopenia, unspecified: Secondary | ICD-10-CM | POA: Diagnosis not present

## 2021-06-24 DIAGNOSIS — E236 Other disorders of pituitary gland: Secondary | ICD-10-CM | POA: Diagnosis not present

## 2021-06-24 DIAGNOSIS — I10 Essential (primary) hypertension: Secondary | ICD-10-CM | POA: Diagnosis not present

## 2021-06-24 DIAGNOSIS — Z6826 Body mass index (BMI) 26.0-26.9, adult: Secondary | ICD-10-CM | POA: Diagnosis not present

## 2021-07-20 ENCOUNTER — Ambulatory Visit (INDEPENDENT_AMBULATORY_CARE_PROVIDER_SITE_OTHER): Payer: Medicare Other | Admitting: Urology

## 2021-07-20 VITALS — BP 164/75 | HR 69

## 2021-07-20 DIAGNOSIS — C679 Malignant neoplasm of bladder, unspecified: Secondary | ICD-10-CM | POA: Diagnosis not present

## 2021-07-20 DIAGNOSIS — N138 Other obstructive and reflux uropathy: Secondary | ICD-10-CM | POA: Diagnosis not present

## 2021-07-20 DIAGNOSIS — N401 Enlarged prostate with lower urinary tract symptoms: Secondary | ICD-10-CM

## 2021-07-20 DIAGNOSIS — Z8551 Personal history of malignant neoplasm of bladder: Secondary | ICD-10-CM | POA: Diagnosis not present

## 2021-07-20 DIAGNOSIS — R339 Retention of urine, unspecified: Secondary | ICD-10-CM

## 2021-07-20 LAB — URINALYSIS, ROUTINE W REFLEX MICROSCOPIC
Bilirubin, UA: NEGATIVE
Glucose, UA: NEGATIVE
Ketones, UA: NEGATIVE
Leukocytes,UA: NEGATIVE
Nitrite, UA: NEGATIVE
Protein,UA: NEGATIVE
Specific Gravity, UA: 1.01 (ref 1.005–1.030)
Urobilinogen, Ur: 0.2 mg/dL (ref 0.2–1.0)
pH, UA: 5.5 (ref 5.0–7.5)

## 2021-07-20 LAB — MICROSCOPIC EXAMINATION
Bacteria, UA: NONE SEEN
Epithelial Cells (non renal): NONE SEEN /hpf (ref 0–10)
WBC, UA: NONE SEEN /hpf (ref 0–5)

## 2021-07-20 MED ORDER — DUTASTERIDE 0.5 MG PO CAPS: 0.5000 mg | ORAL_CAPSULE | Freq: Every day | ORAL | 3 refills | Status: DC

## 2021-07-20 MED ORDER — TAMSULOSIN HCL 0.4 MG PO CAPS: 0.4000 mg | ORAL_CAPSULE | Freq: Two times a day (BID) | ORAL | 11 refills | Status: DC

## 2021-07-20 MED ORDER — CIPROFLOXACIN HCL 500 MG PO TABS
500.0000 mg | ORAL_TABLET | Freq: Once | ORAL | Status: AC
  Administered 2021-07-20: 500 mg via ORAL

## 2021-07-20 NOTE — Progress Notes (Signed)
? ?07/20/2021 ?10:11 AM  ? ?Eric [name] ?[birth year] ?[id number] ? ?Referring provider: Curlene Labrum, MD ?[address] ?[place],  [place] [address] ? ?Followup BPh and bladder cancer ? ? ?HPI: ?Mr Eric [name] is a 76yo here for followup for BPH and bladder cancer. IPSS 10 QOL 2 on dutasteride and flomax daily. Urine stream strong. NO feeling of incomplete emptying. Nocturia 1-3x depending on fluid consumption. No hematuria or dysuria. No other complaints today ? ? ?PMH: ?Past Medical History:  ?Diagnosis Date  ? Arthritis   ? Bladder cancer ([place]) 06/29/2016  ? 2013  ? Complication of anesthesia   ? "sometimes I am slow to awaken."  ? Diabetes ([place]) 06/29/2016  ? DVT (deep venous thrombosis) ([place])   ? left leg  ? Essential hypertension, benign 06/29/2016  ? Head injury 02/09/2020  ? concussion  ? High cholesterol 06/29/2016  ? 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 Neuropathy   ? Pneumonia 09/25/2020  ? ? ?Surgical History: ?Past Surgical History:  ?Procedure Laterality Date  ? BIOPSY  09/29/2016  ? Procedure: BIOPSY;  Surgeon: Rogene Houston, MD;  [facility];  Service: Endoscopy;;  colon  ? BIOPSY  11/14/2019  ? Procedure: BIOPSY;  Surgeon: Rogene Houston, MD;  [facility];  Service: Endoscopy;;  ? BLADDER SURGERY    ? 2013  ? BRONCHIAL BIOPSY  02/02/2021  ? Procedure: BRONCHIAL BIOPSIES;  Surgeon: Garner Nash, DO;  Location: [facility];  Service: Pulmonary;;  ? BRONCHIAL BRUSHINGS  02/02/2021  ? Procedure: BRONCHIAL BRUSHINGS;  Surgeon: Garner Nash, DO;  Location: [facility];  Service: Pulmonary;;  ? BRONCHIAL NEEDLE ASPIRATION BIOPSY  02/02/2021  ? Procedure: BRONCHIAL NEEDLE ASPIRATION BIOPSIES;  Surgeon: Garner Nash, DO;  Location: [facility];  Service: Pulmonary;;  ? BRONCHIAL WASHINGS  02/02/2021  ? Procedure: BRONCHIAL WASHINGS;  Surgeon: Garner Nash, DO;  Location: [facility];  Service: Pulmonary;;  ? CHOLECYSTECTOMY    ? COLONOSCOPY N/A 09/29/2016  ?  Procedure: COLONOSCOPY;  Surgeon: Rogene Houston, MD;  [facility];  Service: Endoscopy;  Laterality: N/A;  12:00  ? COLONOSCOPY N/A 11/14/2019  ? Procedure: COLONOSCOPY;  Surgeon: Rogene Houston, MD;  [facility];  Service: Endoscopy;  Laterality: N/A;  125, per office, pt knows new arrival time  ? CRANIOTOMY N/A 04/24/2020  ? Procedure: 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;  Surgeon: Ashok Pall, MD;  Location: [facility];  Service: Neurosurgery;  Laterality: N/A;  3C/RM 20  ? ESOPHAGOGASTRODUODENOSCOPY N/A 11/14/2019  ? Procedure: ESOPHAGOGASTRODUODENOSCOPY (EGD);  Surgeon: Rogene Houston, MD;  [facility];  Service: Endoscopy;  Laterality: N/A;  ? FIDUCIAL MARKER PLACEMENT  02/02/2021  ? Procedure: FIDUCIAL MARKER PLACEMENT;  Surgeon: Garner Nash, DO;  Location: [facility];  Service: Pulmonary;;  ? knee athroscopy    ? TONSILLECTOMY    ? as child  ? TRANSPHENOIDAL APPROACH EXPOSURE N/A 04/24/2020  ? Procedure: TRANSPHENOIDAL APPROACH EXPOSURE;  Surgeon: Jerrell Belfast, MD;  Location: [facility];  Service: ENT;  Laterality: N/A;  ? VIDEO BRONCHOSCOPY WITH RADIAL ENDOBRONCHIAL ULTRASOUND  02/02/2021  ? Procedure: RADIAL ENDOBRONCHIAL ULTRASOUND;  Surgeon: Garner Nash, DO;  Location: [facility];  Service: Pulmonary;;  ? ? ?Home Medications:  ?Allergies as of 07/20/2021   ? ?   Reactions  ? Metformin And Related Other (See Comments)  ? Intolerance- "caused bladder cancer"  ? ?  ? ?  ?  Medication List  ?  ? ?  ? Accurate as of Jul 20, 2021 10:11 AM. If you have any questions, ask your nurse or doctor.  ?  ?  ? ?  ? ?B-complex with vitamin C tablet ?Take 1 tablet by mouth daily. ?  ?Blood Sugar Balance Tabs ?Take 1 tablet by mouth in the morning and at bedtime. Glucocil ?  ?CoQ10 200 MG Caps ?Take 200 mg by mouth daily. ?  ?dutasteride 0.5 MG capsule ?Commonly known as: AVODART ?Take 1 capsule (0.5 mg total) by mouth daily. ?  ?Flaxseed Oil 1000  MG Caps ?Take 1,000 mg by mouth daily. ?  ?gabapentin 300 MG capsule ?Commonly known as: NEURONTIN ?Take 300 mg by mouth 3 (three) times daily. ?  ?lisinopril 10 MG tablet ?Commonly known as: ZESTRIL ?Take 10 mg by mouth daily. ?  ?multivitamin with minerals Tabs tablet ?Take 1 tablet by mouth daily. ?  ?OVER THE COUNTER MEDICATION ?Take 1 capsule by mouth daily. Cellwise ?  ?OVER THE COUNTER MEDICATION ?Take 1 capsule by mouth daily. Blueberry ?  ?OVER THE COUNTER MEDICATION ?Take 1 tablet by mouth daily. Replenex Extra Strength ?  ?OVER THE COUNTER MEDICATION ?Take 1 tablet by mouth daily. Acid-A-Cal ?  ?psyllium 58.6 % powder ?Commonly known as: METAMUCIL ?Take 1 packet by mouth daily. ?  ?rosuvastatin 10 MG tablet ?Commonly known as: CRESTOR ?Take 10 mg by mouth daily. ?  ?tamsulosin 0.4 MG Caps capsule ?Commonly known as: FLOMAX ?Take 1 capsule (0.4 mg total) by mouth in the morning and at bedtime. ?  ?vitamin C 1000 MG tablet ?Take 1,000 mg by mouth in the morning and at bedtime. ?  ?Vitamin D3 50 MCG (2000 UT) Tabs ?Take 2,000 Units by mouth daily. ?  ?VITAMIN K2-VITAMIN D3 PO ?Take 1 tablet by mouth daily. ?  ?Zinc 50 MG Tabs ?Take 50 mg by mouth daily. ?  ?zolpidem 5 MG tablet ?Commonly known as: AMBIEN ?Take 5 mg by mouth at bedtime as needed for sleep. ?  ? ?  ? ? ?Allergies:  ?Allergies  ?Allergen Reactions  ? Metformin And Related Other (See Comments)  ?  Intolerance- "caused bladder cancer"  ? ? ?Family History: ?Family History  ?Problem Relation Age of Onset  ? Hypercholesterolemia Father   ? Hypercholesterolemia Mother   ? Colon cancer Neg Hx   ? Urolithiasis Neg Hx   ? Sudden death Neg Hx   ? Lupus Neg Hx   ? Sickle cell trait Neg Hx   ? ? ?Social History:  reports that he has quit smoking. His smoking use included cigarettes. He has quit using smokeless tobacco.  His smokeless tobacco use included chew. He reports that he does not currently use alcohol. He reports that he does not use  drugs. ? ?ROS: ?All other review of systems were reviewed and are negative except what is noted above in HPI ? ?Physical Exam: ?BP (!) 164/75   Pulse 69   ?Constitutional:  Alert and oriented, No acute distress. ?HEENT: [place] AT, moist mucus membranes.  Trachea midline, no masses. ?Cardiovascular: No clubbing, cyanosis, or edema. ?Respiratory: Normal respiratory effort, no increased work of breathing. ?GI: Abdomen is soft, nontender, nondistended, no abdominal masses ?GU: No CVA tenderness.  ?Lymph: No cervical or inguinal lymphadenopathy. ?Skin: No rashes, bruises or suspicious lesions. ?Neurologic: Grossly intact, no focal deficits, moving all 4 extremities. ?Psychiatric: Normal mood and affect. ? ?Laboratory Data: ?Lab Results  ?Component Value Date  ? WBC 5.6  02/02/2021  ? HGB 13.2 02/02/2021  ? HCT 41.7 02/02/2021  ? MCV 93.3 02/02/2021  ? PLT 135 (L) 02/02/2021  ? ? ?Lab Results  ?Component Value Date  ? CREATININE 1.08 02/02/2021  ? ? ?No results found for: PSA ? ?No results found for: TESTOSTERONE ? ?Lab Results  ?Component Value Date  ? HGBA1C 6.5 (H) 09/26/2020  ? ? ?Urinalysis ?   ?Component Value Date/Time  ? COLORURINE YELLOW 09/26/2020 2230  ? APPEARANCEUR CLEAR 09/26/2020 2230  ? APPEARANCEUR Clear 08/12/2020 1444  ? LABSPEC 1.005 09/26/2020 2230  ? PHURINE 6.0 09/26/2020 2230  ? GLUCOSEU NEGATIVE 09/26/2020 2230  ? HGBUR SMALL (A) 09/26/2020 2230  ? [place] NEGATIVE 09/26/2020 2230  ? BILIRUBINUR Negative 08/12/2020 1444  ? [place] NEGATIVE 09/26/2020 2230  ? PROTEINUR NEGATIVE 09/26/2020 2230  ? NITRITE NEGATIVE 09/26/2020 2230  ? LEUKOCYTESUR NEGATIVE 09/26/2020 2230  ? ? ?Lab Results  ?Component Value Date  ? LABMICR See below: 08/12/2020  ? [place] None seen 08/12/2020  ? LABEPIT None seen 08/12/2020  ? BACTERIA NONE SEEN 09/26/2020  ? ? ?Pertinent Imaging: ? ?No results found for this or any previous visit. ? ?No results found for this or any previous visit. ? ?No results found for this or any  previous visit. ? ?No results found for this or any previous visit. ? ?No results found for this or any previous visit. ? ?No results found for this or any previous visit. ? ?No results found for this or any previous

## 2021-07-21 LAB — PSA: Prostate Specific Ag, Serum: <0.1 ng/mL (ref 0.0–4.0)

## 2021-07-28 ENCOUNTER — Encounter: Payer: Self-pay | Admitting: Urology

## 2021-07-28 NOTE — Patient Instructions (Signed)

## 2021-08-24 DIAGNOSIS — M25572 Pain in left ankle and joints of left foot: Secondary | ICD-10-CM | POA: Diagnosis not present

## 2021-08-24 DIAGNOSIS — M79672 Pain in left foot: Secondary | ICD-10-CM | POA: Diagnosis not present

## 2021-09-15 DIAGNOSIS — H47631 Disorders of visual cortex in (due to) neoplasm, right side of brain: Secondary | ICD-10-CM | POA: Diagnosis not present

## 2021-09-15 DIAGNOSIS — H2513 Age-related nuclear cataract, bilateral: Secondary | ICD-10-CM | POA: Diagnosis not present

## 2021-09-15 DIAGNOSIS — H43813 Vitreous degeneration, bilateral: Secondary | ICD-10-CM | POA: Diagnosis not present

## 2021-09-15 DIAGNOSIS — H31113 Age-related choroidal atrophy, bilateral: Secondary | ICD-10-CM | POA: Diagnosis not present

## 2021-09-21 DIAGNOSIS — E7849 Other hyperlipidemia: Secondary | ICD-10-CM | POA: Diagnosis not present

## 2021-09-21 DIAGNOSIS — E1169 Type 2 diabetes mellitus with other specified complication: Secondary | ICD-10-CM | POA: Diagnosis not present

## 2021-09-21 DIAGNOSIS — E039 Hypothyroidism, unspecified: Secondary | ICD-10-CM | POA: Diagnosis not present

## 2021-09-21 DIAGNOSIS — R7989 Other specified abnormal findings of blood chemistry: Secondary | ICD-10-CM | POA: Diagnosis not present

## 2021-09-21 DIAGNOSIS — I10 Essential (primary) hypertension: Secondary | ICD-10-CM | POA: Diagnosis not present

## 2021-10-01 ENCOUNTER — Telehealth: Payer: Self-pay | Admitting: Pulmonary Disease

## 2021-10-01 DIAGNOSIS — R911 Solitary pulmonary nodule: Secondary | ICD-10-CM

## 2021-10-01 DIAGNOSIS — D696 Thrombocytopenia, unspecified: Secondary | ICD-10-CM | POA: Diagnosis not present

## 2021-10-01 DIAGNOSIS — Z86010 Personal history of colonic polyps: Secondary | ICD-10-CM | POA: Diagnosis not present

## 2021-10-01 DIAGNOSIS — C679 Malignant neoplasm of bladder, unspecified: Secondary | ICD-10-CM | POA: Diagnosis not present

## 2021-10-01 DIAGNOSIS — I10 Essential (primary) hypertension: Secondary | ICD-10-CM | POA: Diagnosis not present

## 2021-10-01 DIAGNOSIS — I700 Atherosclerosis of aorta: Secondary | ICD-10-CM | POA: Diagnosis not present

## 2021-10-01 DIAGNOSIS — E1169 Type 2 diabetes mellitus with other specified complication: Secondary | ICD-10-CM | POA: Diagnosis not present

## 2021-10-01 DIAGNOSIS — G4700 Insomnia, unspecified: Secondary | ICD-10-CM | POA: Diagnosis not present

## 2021-10-01 DIAGNOSIS — Z6826 Body mass index (BMI) 26.0-26.9, adult: Secondary | ICD-10-CM | POA: Diagnosis not present

## 2021-10-01 NOTE — Telephone Encounter (Signed)
[place] is calling and requesting to know the treatment plan for the patients nodules.   Please advise sir

## 2021-10-04 NOTE — Telephone Encounter (Signed)
[place] back and told them the recommendations from Dr Valeta Harms. And that patient should be having follow up imaging soon from Dr Kipp Brood. And that Dr Valeta Harms can see him in office when patient is ready and wanting to come in to discuss with Dr icard steps going forward. Nothing further needed

## 2021-10-07 NOTE — Telephone Encounter (Signed)
I have placed a CT scan per Dr. Valeta Harms and the [facility] are aware of the scan order to get authorized.

## 2021-10-07 NOTE — Addendum Note (Signed)
Addended by: Dessie Coma on: 10/07/2021 11:22 AM   Modules accepted: Orders

## 2021-10-21 ENCOUNTER — Ambulatory Visit (HOSPITAL_COMMUNITY)
Admission: RE | Admit: 2021-10-21 | Discharge: 2021-10-21 | Disposition: A | Discharge status: Home / Self Care | Payer: Medicare Other | Source: Ambulatory Visit | Attending: Pulmonary Disease | Admitting: Pulmonary Disease

## 2021-10-21 DIAGNOSIS — R911 Solitary pulmonary nodule: Secondary | ICD-10-CM | POA: Diagnosis not present

## 2021-10-25 NOTE — Progress Notes (Unsigned)
History of Present Illness Eric [name] is a 76 y.o. male former smoker  with history of left upper lobe lung nodule, and history of bladder cancer, HTN,  He is followed by Dr. Valeta Harms.  10/25/2021  Test Results:     Latest Ref Rng & Units 02/02/2021   12:18 PM 09/27/2020    5:43 AM 09/26/2020    5:56 AM  CBC  WBC 4.0 - 10.5 K/uL 5.6  14.0  9.5   Hemoglobin 13.0 - 17.0 g/dL 13.2  12.5  13.1   Hematocrit 39.0 - 52.0 % 41.7  37.2  40.2   Platelets 150 - 400 K/uL 135  119  105        Latest Ref Rng & Units 02/02/2021   12:18 PM 09/27/2020    5:43 AM 09/26/2020    5:56 AM  BMP  Glucose 70 - 99 mg/dL 121  188  204   BUN 8 - 23 mg/dL '16  26  18   '$ Creatinine 0.61 - 1.24 mg/dL 1.08  1.17  1.33   Sodium 135 - 145 mmol/L 141  138  129   Potassium 3.5 - 5.1 mmol/L 3.9  4.2  3.7   Chloride 98 - 111 mmol/L 112  110  101   CO2 22 - 32 mmol/L '20  20  19   '$ Calcium 8.9 - 10.3 mg/dL 9.2  9.1  8.4     BNP No results found for: "BNP"  ProBNP No results found for: "PROBNP"  PFT    Component Value Date/Time   FEV1PRE 3.19 01/26/2021 1046   FEV1POST 3.16 01/26/2021 1046   FVCPRE 4.09 01/26/2021 1046   FVCPOST 3.84 01/26/2021 1046   TLC 6.16 01/26/2021 1046   DLCOUNC 18.17 01/26/2021 1046   PREFEV1FVCRT 78 01/26/2021 1046   PSTFEV1FVCRT 82 01/26/2021 1046    CT Super D Chest Wo Contrast  Result Date: 10/22/2021 CLINICAL DATA:  Left upper lobe pulmonary nodule. Nondiagnostic biopsy November 2022. EXAM: CT CHEST WITHOUT CONTRAST TECHNIQUE: Multidetector CT imaging of the chest was performed using thin slice collimation for electromagnetic bronchoscopy planning purposes, without intravenous contrast. RADIATION DOSE REDUCTION: This exam was performed according to the departmental dose-optimization program which includes automated exposure control, adjustment of the mA and/or kV according to patient size and/or use of iterative reconstruction technique. COMPARISON:  Chest CT  05/10/2021 and 12/28/2020.  PET-CT 11/09/2020. FINDINGS: Cardiovascular: Atherosclerosis of the aorta, great vessels and coronary arteries. No acute vascular findings on noncontrast imaging. The heart size is normal. There is no pericardial effusion. Mediastinum/Nodes: There are no enlarged mediastinal, hilar or axillary lymph nodes.Small mediastinal lymph nodes are unchanged. Hilar assessment is limited by the lack of intravenous contrast, although the hilar contours appear unchanged. The thyroid gland, trachea and esophagus demonstrate no significant findings. Lungs/Pleura: No pleural effusion or pneumothorax. Bilobed left upper lobe pulmonary nodule posterior to a biopsy clip appears slightly smaller. Currently, the anterior component of this measures 4 mm (image 44/7) and the posterior component 5 mm (image 43/7). This compares with 6 mm for each on the prior study. 5 mm right middle lobe nodule on image 109/7 is unchanged and may be endobronchial. No new or enlarging pulmonary nodules. Upper abdomen: The visualized upper abdomen appears stable without significant findings status post cholecystectomy. 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 Musculoskeletal/Chest wall: There is no chest wall mass or suspicious osseous finding. IMPRESSION: 1. Interval decreased  size of bilobed left upper lobe pulmonary nodule consistent with a benign finding. 2. Stable small right middle lobe nodule. 3. No new or enlarging pulmonary nodules. 4. Coronary and Aortic Atherosclerosis (ICD10-I70.0). Electronically Signed   By: Richardean Sale M.D.   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 His smoking use included cigarettes. He has quit using smokeless tobacco.  His smokeless tobacco use included chew. He reports that he does not currently use alcohol. He reports that he does not use drugs.  Tobacco Cessation: Counseling given: Not Answered   Past surgical hx, Family hx, Social hx all reviewed.  Current Outpatient Medications on File Prior to Visit  Medication Sig   Ascorbic Acid (VITAMIN C) 1000 MG tablet Take 1,000 mg by mouth in the morning and at bedtime.   B Complex-C (B-COMPLEX WITH VITAMIN C) tablet Take 1 tablet by mouth daily.   Cholecalciferol (VITAMIN D3) 50 MCG (2000 UT) TABS Take 2,000 Units by mouth daily.   Coenzyme Q10 (COQ10) 200 MG CAPS Take 200 mg by mouth daily.   dutasteride (AVODART) 0.5 MG capsule Take 1 capsule (0.5 mg total) by mouth daily.   Flaxseed, Linseed, (FLAXSEED OIL) 1000 MG CAPS Take 1,000 mg by mouth daily.   gabapentin (NEURONTIN) 300 MG capsule Take 300 mg by mouth 3 (three) times daily.   lisinopril (ZESTRIL) 10 MG tablet Take 10 mg by mouth daily.   Misc Natural Products (BLOOD SUGAR BALANCE) TABS Take 1 tablet by mouth in the morning and at bedtime. Glucocil   Multiple Vitamin (MULTIVITAMIN WITH MINERALS) TABS tablet Take 1 tablet by mouth daily.   OVER THE COUNTER MEDICATION Take 1 capsule by mouth daily. Cellwise   OVER THE COUNTER MEDICATION Take 1 capsule by mouth daily. Blueberry   OVER THE COUNTER MEDICATION Take 1 tablet by mouth daily. Replenex Extra Strength   OVER THE  COUNTER MEDICATION Take 1 tablet by mouth daily. Acid-A-Cal   psyllium (METAMUCIL) 58.6 % powder Take 1 packet by mouth daily.   rosuvastatin (CRESTOR) 10 MG tablet Take 10 mg by mouth daily.   tamsulosin (FLOMAX) 0.4 MG CAPS capsule Take 1 capsule (0.4 mg total) by mouth in the morning and at bedtime.   Vitamin D-Vitamin K (VITAMIN K2-VITAMIN D3 PO) Take 1 tablet by mouth daily.   Zinc 50 MG TABS Take 50 mg by mouth daily.   zolpidem (AMBIEN) 5 MG tablet Take 5 mg by mouth at bedtime as needed for sleep.   No current facility-administered medications on file prior to visit.     Allergies  Allergen Reactions   Metformin  And Related Other (See Comments)    Intolerance- "caused bladder cancer"    Review Of Systems:  Constitutional:   No  weight loss, night sweats,  Fevers, chills, fatigue, or  lassitude.  HEENT:   No headaches,  Difficulty swallowing,  Tooth/dental problems, or  Sore throat,                No sneezing, itching, ear ache, nasal congestion, post nasal drip,   CV:  No chest pain,  Orthopnea, PND, swelling in lower extremities, anasarca, dizziness, palpitations, syncope.   GI  No heartburn, indigestion, abdominal pain, nausea, vomiting, diarrhea, change in bowel habits, loss of appetite, bloody stools.   Resp: No shortness of breath with exertion or at rest.  No excess mucus, no productive cough,  No non-productive cough,  No coughing up of blood.  No change in color of mucus.  No wheezing.  No chest wall deformity  Skin: no rash or lesions.  GU: no dysuria, change in color of urine, no urgency or frequency.  No flank pain, no hematuria   MS:  No joint pain or swelling.  No decreased range of motion.  No back pain.  Psych:  No change in mood or affect. No depression or anxiety.  No memory loss.   Vital Signs There were no vitals taken for this visit.   Physical Exam:  General- No distress,  A&Ox3 ENT: No sinus tenderness, TM clear, pale nasal mucosa, no oral  exudate,no post nasal drip, no LAN Cardiac: S1, S2, regular rate and rhythm, no murmur Chest: No wheeze/ rales/ dullness; no accessory muscle use, no nasal flaring, no sternal retractions Abd.: Soft Non-tender Ext: No clubbing cyanosis, edema Neuro:  normal strength Skin: No rashes, warm and dry Psych: normal mood and behavior   Assessment/Plan  No problem-specific Assessment & Plan notes found for this encounter.    Magdalen Spatz, NP 10/25/2021  1:37 PM

## 2021-10-26 ENCOUNTER — Ambulatory Visit (INDEPENDENT_AMBULATORY_CARE_PROVIDER_SITE_OTHER): Payer: Medicare Other | Admitting: Acute Care

## 2021-10-26 ENCOUNTER — Encounter: Payer: Self-pay | Admitting: Acute Care

## 2021-10-26 VITALS — BP 130/70 | HR 86 | Temp 97.7°F | Ht 70.0 in | Wt 173.4 lb

## 2021-10-26 DIAGNOSIS — R911 Solitary pulmonary nodule: Secondary | ICD-10-CM | POA: Diagnosis not present

## 2021-10-26 DIAGNOSIS — Z8551 Personal history of malignant neoplasm of bladder: Secondary | ICD-10-CM | POA: Diagnosis not present

## 2021-10-26 DIAGNOSIS — Z8616 Personal history of COVID-19: Secondary | ICD-10-CM | POA: Diagnosis not present

## 2021-10-26 NOTE — Patient Instructions (Addendum)
It is good to see you today. The nodule we are watching has had a decrease in size on this most recent scan.  This is good news. We will plan on a 6 month follow up Super D Ct Chest. To keep an eye on it. Call sooner with any unexplained weight loss or blood in your sputum.  Follow up after 6 month CT Chest to review.  Call us if you need [place] sooner. Please contact office for sooner follow up if symptoms do not improve or worsen or seek emergency care

## 2021-10-27 ENCOUNTER — Encounter: Payer: Self-pay | Admitting: Acute Care

## 2021-11-03 NOTE — Progress Notes (Signed)
Thanks for seeing the patient and discussing CT results.   Thanks,  BLI  Garner Nash, DO [facility] 11/03/2021 5:03 PM

## 2021-11-04 DIAGNOSIS — L821 Other seborrheic keratosis: Secondary | ICD-10-CM | POA: Diagnosis not present

## 2021-11-04 DIAGNOSIS — D485 Neoplasm of uncertain behavior of skin: Secondary | ICD-10-CM | POA: Diagnosis not present

## 2021-11-04 DIAGNOSIS — D225 Melanocytic nevi of trunk: Secondary | ICD-10-CM | POA: Diagnosis not present

## 2021-11-04 DIAGNOSIS — D1801 Hemangioma of skin and subcutaneous tissue: Secondary | ICD-10-CM | POA: Diagnosis not present

## 2022-01-11 DIAGNOSIS — M79672 Pain in left foot: Secondary | ICD-10-CM | POA: Diagnosis not present

## 2022-01-11 DIAGNOSIS — E1142 Type 2 diabetes mellitus with diabetic polyneuropathy: Secondary | ICD-10-CM | POA: Diagnosis not present

## 2022-02-22 DIAGNOSIS — E1142 Type 2 diabetes mellitus with diabetic polyneuropathy: Secondary | ICD-10-CM | POA: Diagnosis not present

## 2022-03-24 DIAGNOSIS — I10 Essential (primary) hypertension: Secondary | ICD-10-CM | POA: Diagnosis not present

## 2022-03-24 DIAGNOSIS — E1169 Type 2 diabetes mellitus with other specified complication: Secondary | ICD-10-CM | POA: Diagnosis not present

## 2022-03-24 DIAGNOSIS — R7989 Other specified abnormal findings of blood chemistry: Secondary | ICD-10-CM | POA: Diagnosis not present

## 2022-03-24 DIAGNOSIS — E7801 Familial hypercholesterolemia: Secondary | ICD-10-CM | POA: Diagnosis not present

## 2022-03-29 DIAGNOSIS — M9901 Segmental and somatic dysfunction of cervical region: Secondary | ICD-10-CM | POA: Diagnosis not present

## 2022-03-29 DIAGNOSIS — S233XXA Sprain of ligaments of thoracic spine, initial encounter: Secondary | ICD-10-CM | POA: Diagnosis not present

## 2022-03-29 DIAGNOSIS — M47816 Spondylosis without myelopathy or radiculopathy, lumbar region: Secondary | ICD-10-CM | POA: Diagnosis not present

## 2022-03-29 DIAGNOSIS — M9902 Segmental and somatic dysfunction of thoracic region: Secondary | ICD-10-CM | POA: Diagnosis not present

## 2022-03-29 DIAGNOSIS — M9903 Segmental and somatic dysfunction of lumbar region: Secondary | ICD-10-CM | POA: Diagnosis not present

## 2022-03-29 DIAGNOSIS — M47812 Spondylosis without myelopathy or radiculopathy, cervical region: Secondary | ICD-10-CM | POA: Diagnosis not present

## 2022-03-30 DIAGNOSIS — H52223 Regular astigmatism, bilateral: Secondary | ICD-10-CM | POA: Diagnosis not present

## 2022-03-30 DIAGNOSIS — H31113 Age-related choroidal atrophy, bilateral: Secondary | ICD-10-CM | POA: Diagnosis not present

## 2022-03-30 DIAGNOSIS — H47631 Disorders of visual cortex in (due to) neoplasm, right side of brain: Secondary | ICD-10-CM | POA: Diagnosis not present

## 2022-03-30 DIAGNOSIS — E119 Type 2 diabetes mellitus without complications: Secondary | ICD-10-CM | POA: Diagnosis not present

## 2022-03-30 DIAGNOSIS — H524 Presbyopia: Secondary | ICD-10-CM | POA: Diagnosis not present

## 2022-03-30 DIAGNOSIS — H2513 Age-related nuclear cataract, bilateral: Secondary | ICD-10-CM | POA: Diagnosis not present

## 2022-03-30 DIAGNOSIS — H43813 Vitreous degeneration, bilateral: Secondary | ICD-10-CM | POA: Diagnosis not present

## 2022-03-30 DIAGNOSIS — H5203 Hypermetropia, bilateral: Secondary | ICD-10-CM | POA: Diagnosis not present

## 2022-03-31 DIAGNOSIS — G4700 Insomnia, unspecified: Secondary | ICD-10-CM | POA: Diagnosis not present

## 2022-03-31 DIAGNOSIS — Z6825 Body mass index (BMI) 25.0-25.9, adult: Secondary | ICD-10-CM | POA: Diagnosis not present

## 2022-03-31 DIAGNOSIS — M9902 Segmental and somatic dysfunction of thoracic region: Secondary | ICD-10-CM | POA: Diagnosis not present

## 2022-03-31 DIAGNOSIS — Z23 Encounter for immunization: Secondary | ICD-10-CM | POA: Diagnosis not present

## 2022-03-31 DIAGNOSIS — S233XXA Sprain of ligaments of thoracic spine, initial encounter: Secondary | ICD-10-CM | POA: Diagnosis not present

## 2022-03-31 DIAGNOSIS — E1169 Type 2 diabetes mellitus with other specified complication: Secondary | ICD-10-CM | POA: Diagnosis not present

## 2022-03-31 DIAGNOSIS — R911 Solitary pulmonary nodule: Secondary | ICD-10-CM | POA: Diagnosis not present

## 2022-03-31 DIAGNOSIS — I10 Essential (primary) hypertension: Secondary | ICD-10-CM | POA: Diagnosis not present

## 2022-03-31 DIAGNOSIS — R030 Elevated blood-pressure reading, without diagnosis of hypertension: Secondary | ICD-10-CM | POA: Diagnosis not present

## 2022-03-31 DIAGNOSIS — M47816 Spondylosis without myelopathy or radiculopathy, lumbar region: Secondary | ICD-10-CM | POA: Diagnosis not present

## 2022-03-31 DIAGNOSIS — C679 Malignant neoplasm of bladder, unspecified: Secondary | ICD-10-CM | POA: Diagnosis not present

## 2022-03-31 DIAGNOSIS — I700 Atherosclerosis of aorta: Secondary | ICD-10-CM | POA: Diagnosis not present

## 2022-03-31 DIAGNOSIS — M9901 Segmental and somatic dysfunction of cervical region: Secondary | ICD-10-CM | POA: Diagnosis not present

## 2022-03-31 DIAGNOSIS — M47812 Spondylosis without myelopathy or radiculopathy, cervical region: Secondary | ICD-10-CM | POA: Diagnosis not present

## 2022-03-31 DIAGNOSIS — D696 Thrombocytopenia, unspecified: Secondary | ICD-10-CM | POA: Diagnosis not present

## 2022-03-31 DIAGNOSIS — M9903 Segmental and somatic dysfunction of lumbar region: Secondary | ICD-10-CM | POA: Diagnosis not present

## 2022-04-05 DIAGNOSIS — M47816 Spondylosis without myelopathy or radiculopathy, lumbar region: Secondary | ICD-10-CM | POA: Diagnosis not present

## 2022-04-05 DIAGNOSIS — M9903 Segmental and somatic dysfunction of lumbar region: Secondary | ICD-10-CM | POA: Diagnosis not present

## 2022-04-05 DIAGNOSIS — S233XXA Sprain of ligaments of thoracic spine, initial encounter: Secondary | ICD-10-CM | POA: Diagnosis not present

## 2022-04-05 DIAGNOSIS — M47812 Spondylosis without myelopathy or radiculopathy, cervical region: Secondary | ICD-10-CM | POA: Diagnosis not present

## 2022-04-05 DIAGNOSIS — M9901 Segmental and somatic dysfunction of cervical region: Secondary | ICD-10-CM | POA: Diagnosis not present

## 2022-04-05 DIAGNOSIS — M9902 Segmental and somatic dysfunction of thoracic region: Secondary | ICD-10-CM | POA: Diagnosis not present

## 2022-04-07 DIAGNOSIS — M9902 Segmental and somatic dysfunction of thoracic region: Secondary | ICD-10-CM | POA: Diagnosis not present

## 2022-04-07 DIAGNOSIS — S233XXA Sprain of ligaments of thoracic spine, initial encounter: Secondary | ICD-10-CM | POA: Diagnosis not present

## 2022-04-07 DIAGNOSIS — M47812 Spondylosis without myelopathy or radiculopathy, cervical region: Secondary | ICD-10-CM | POA: Diagnosis not present

## 2022-04-07 DIAGNOSIS — M9903 Segmental and somatic dysfunction of lumbar region: Secondary | ICD-10-CM | POA: Diagnosis not present

## 2022-04-07 DIAGNOSIS — M9901 Segmental and somatic dysfunction of cervical region: Secondary | ICD-10-CM | POA: Diagnosis not present

## 2022-04-07 DIAGNOSIS — M47816 Spondylosis without myelopathy or radiculopathy, lumbar region: Secondary | ICD-10-CM | POA: Diagnosis not present

## 2022-04-13 DIAGNOSIS — M47816 Spondylosis without myelopathy or radiculopathy, lumbar region: Secondary | ICD-10-CM | POA: Diagnosis not present

## 2022-04-13 DIAGNOSIS — M9901 Segmental and somatic dysfunction of cervical region: Secondary | ICD-10-CM | POA: Diagnosis not present

## 2022-04-13 DIAGNOSIS — M47812 Spondylosis without myelopathy or radiculopathy, cervical region: Secondary | ICD-10-CM | POA: Diagnosis not present

## 2022-04-13 DIAGNOSIS — S233XXA Sprain of ligaments of thoracic spine, initial encounter: Secondary | ICD-10-CM | POA: Diagnosis not present

## 2022-04-13 DIAGNOSIS — M9902 Segmental and somatic dysfunction of thoracic region: Secondary | ICD-10-CM | POA: Diagnosis not present

## 2022-04-13 DIAGNOSIS — M9903 Segmental and somatic dysfunction of lumbar region: Secondary | ICD-10-CM | POA: Diagnosis not present

## 2022-04-18 DIAGNOSIS — S233XXA Sprain of ligaments of thoracic spine, initial encounter: Secondary | ICD-10-CM | POA: Diagnosis not present

## 2022-04-18 DIAGNOSIS — M47816 Spondylosis without myelopathy or radiculopathy, lumbar region: Secondary | ICD-10-CM | POA: Diagnosis not present

## 2022-04-18 DIAGNOSIS — M47812 Spondylosis without myelopathy or radiculopathy, cervical region: Secondary | ICD-10-CM | POA: Diagnosis not present

## 2022-04-18 DIAGNOSIS — M9901 Segmental and somatic dysfunction of cervical region: Secondary | ICD-10-CM | POA: Diagnosis not present

## 2022-04-18 DIAGNOSIS — M9903 Segmental and somatic dysfunction of lumbar region: Secondary | ICD-10-CM | POA: Diagnosis not present

## 2022-04-18 DIAGNOSIS — M9902 Segmental and somatic dysfunction of thoracic region: Secondary | ICD-10-CM | POA: Diagnosis not present

## 2022-04-22 ENCOUNTER — Other Ambulatory Visit (HOSPITAL_COMMUNITY): Payer: Self-pay | Admitting: Neurosurgery

## 2022-04-22 DIAGNOSIS — M47812 Spondylosis without myelopathy or radiculopathy, cervical region: Secondary | ICD-10-CM | POA: Diagnosis not present

## 2022-04-22 DIAGNOSIS — M9901 Segmental and somatic dysfunction of cervical region: Secondary | ICD-10-CM | POA: Diagnosis not present

## 2022-04-22 DIAGNOSIS — M9903 Segmental and somatic dysfunction of lumbar region: Secondary | ICD-10-CM | POA: Diagnosis not present

## 2022-04-22 DIAGNOSIS — M47816 Spondylosis without myelopathy or radiculopathy, lumbar region: Secondary | ICD-10-CM | POA: Diagnosis not present

## 2022-04-22 DIAGNOSIS — M9902 Segmental and somatic dysfunction of thoracic region: Secondary | ICD-10-CM | POA: Diagnosis not present

## 2022-04-22 DIAGNOSIS — S233XXA Sprain of ligaments of thoracic spine, initial encounter: Secondary | ICD-10-CM | POA: Diagnosis not present

## 2022-04-22 DIAGNOSIS — D352 Benign neoplasm of pituitary gland: Secondary | ICD-10-CM

## 2022-04-25 ENCOUNTER — Ambulatory Visit (HOSPITAL_COMMUNITY)
Admission: RE | Admit: 2022-04-25 | Discharge: 2022-04-25 | Disposition: A | Discharge status: Home / Self Care | Payer: Medicare Other | Source: Ambulatory Visit | Attending: Acute Care | Admitting: Acute Care

## 2022-04-25 DIAGNOSIS — M47812 Spondylosis without myelopathy or radiculopathy, cervical region: Secondary | ICD-10-CM | POA: Diagnosis not present

## 2022-04-25 DIAGNOSIS — R911 Solitary pulmonary nodule: Secondary | ICD-10-CM | POA: Diagnosis not present

## 2022-04-25 DIAGNOSIS — R918 Other nonspecific abnormal finding of lung field: Secondary | ICD-10-CM | POA: Diagnosis not present

## 2022-04-25 DIAGNOSIS — M9902 Segmental and somatic dysfunction of thoracic region: Secondary | ICD-10-CM | POA: Diagnosis not present

## 2022-04-25 DIAGNOSIS — M9903 Segmental and somatic dysfunction of lumbar region: Secondary | ICD-10-CM | POA: Diagnosis not present

## 2022-04-25 DIAGNOSIS — M47816 Spondylosis without myelopathy or radiculopathy, lumbar region: Secondary | ICD-10-CM | POA: Diagnosis not present

## 2022-04-25 DIAGNOSIS — M9901 Segmental and somatic dysfunction of cervical region: Secondary | ICD-10-CM | POA: Diagnosis not present

## 2022-04-25 DIAGNOSIS — S233XXA Sprain of ligaments of thoracic spine, initial encounter: Secondary | ICD-10-CM | POA: Diagnosis not present

## 2022-05-02 DIAGNOSIS — M9903 Segmental and somatic dysfunction of lumbar region: Secondary | ICD-10-CM | POA: Diagnosis not present

## 2022-05-02 DIAGNOSIS — M9902 Segmental and somatic dysfunction of thoracic region: Secondary | ICD-10-CM | POA: Diagnosis not present

## 2022-05-02 DIAGNOSIS — M47812 Spondylosis without myelopathy or radiculopathy, cervical region: Secondary | ICD-10-CM | POA: Diagnosis not present

## 2022-05-02 DIAGNOSIS — M47816 Spondylosis without myelopathy or radiculopathy, lumbar region: Secondary | ICD-10-CM | POA: Diagnosis not present

## 2022-05-02 DIAGNOSIS — M9901 Segmental and somatic dysfunction of cervical region: Secondary | ICD-10-CM | POA: Diagnosis not present

## 2022-05-02 DIAGNOSIS — S233XXA Sprain of ligaments of thoracic spine, initial encounter: Secondary | ICD-10-CM | POA: Diagnosis not present

## 2022-05-04 DIAGNOSIS — H2513 Age-related nuclear cataract, bilateral: Secondary | ICD-10-CM | POA: Diagnosis not present

## 2022-05-06 DIAGNOSIS — M47816 Spondylosis without myelopathy or radiculopathy, lumbar region: Secondary | ICD-10-CM | POA: Diagnosis not present

## 2022-05-06 DIAGNOSIS — M9903 Segmental and somatic dysfunction of lumbar region: Secondary | ICD-10-CM | POA: Diagnosis not present

## 2022-05-06 DIAGNOSIS — M9901 Segmental and somatic dysfunction of cervical region: Secondary | ICD-10-CM | POA: Diagnosis not present

## 2022-05-06 DIAGNOSIS — S233XXA Sprain of ligaments of thoracic spine, initial encounter: Secondary | ICD-10-CM | POA: Diagnosis not present

## 2022-05-06 DIAGNOSIS — M47812 Spondylosis without myelopathy or radiculopathy, cervical region: Secondary | ICD-10-CM | POA: Diagnosis not present

## 2022-05-06 DIAGNOSIS — M9902 Segmental and somatic dysfunction of thoracic region: Secondary | ICD-10-CM | POA: Diagnosis not present

## 2022-05-12 DIAGNOSIS — S233XXA Sprain of ligaments of thoracic spine, initial encounter: Secondary | ICD-10-CM | POA: Diagnosis not present

## 2022-05-12 DIAGNOSIS — M47812 Spondylosis without myelopathy or radiculopathy, cervical region: Secondary | ICD-10-CM | POA: Diagnosis not present

## 2022-05-12 DIAGNOSIS — M47816 Spondylosis without myelopathy or radiculopathy, lumbar region: Secondary | ICD-10-CM | POA: Diagnosis not present

## 2022-05-12 DIAGNOSIS — M9902 Segmental and somatic dysfunction of thoracic region: Secondary | ICD-10-CM | POA: Diagnosis not present

## 2022-05-12 DIAGNOSIS — M9903 Segmental and somatic dysfunction of lumbar region: Secondary | ICD-10-CM | POA: Diagnosis not present

## 2022-05-12 DIAGNOSIS — M9901 Segmental and somatic dysfunction of cervical region: Secondary | ICD-10-CM | POA: Diagnosis not present

## 2022-05-17 ENCOUNTER — Ambulatory Visit (HOSPITAL_COMMUNITY)
Admission: RE | Admit: 2022-05-17 | Discharge: 2022-05-17 | Disposition: A | Discharge status: Home / Self Care | Payer: Medicare Other | Source: Ambulatory Visit | Attending: Neurosurgery | Admitting: Neurosurgery

## 2022-05-17 DIAGNOSIS — D352 Benign neoplasm of pituitary gland: Secondary | ICD-10-CM

## 2022-05-17 DIAGNOSIS — G9389 Other specified disorders of brain: Secondary | ICD-10-CM | POA: Diagnosis not present

## 2022-05-17 MED ORDER — GADOBUTROL 1 MMOL/ML IV SOLN
7.0000 mL | Freq: Once | INTRAVENOUS | Status: AC | PRN
  Administered 2022-05-17: 7 mL via INTRAVENOUS

## 2022-05-19 DIAGNOSIS — M47812 Spondylosis without myelopathy or radiculopathy, cervical region: Secondary | ICD-10-CM | POA: Diagnosis not present

## 2022-05-19 DIAGNOSIS — M47816 Spondylosis without myelopathy or radiculopathy, lumbar region: Secondary | ICD-10-CM | POA: Diagnosis not present

## 2022-05-19 DIAGNOSIS — M9902 Segmental and somatic dysfunction of thoracic region: Secondary | ICD-10-CM | POA: Diagnosis not present

## 2022-05-19 DIAGNOSIS — M9901 Segmental and somatic dysfunction of cervical region: Secondary | ICD-10-CM | POA: Diagnosis not present

## 2022-05-19 DIAGNOSIS — M9903 Segmental and somatic dysfunction of lumbar region: Secondary | ICD-10-CM | POA: Diagnosis not present

## 2022-05-19 DIAGNOSIS — S233XXA Sprain of ligaments of thoracic spine, initial encounter: Secondary | ICD-10-CM | POA: Diagnosis not present

## 2022-05-26 DIAGNOSIS — M47816 Spondylosis without myelopathy or radiculopathy, lumbar region: Secondary | ICD-10-CM | POA: Diagnosis not present

## 2022-05-26 DIAGNOSIS — M9903 Segmental and somatic dysfunction of lumbar region: Secondary | ICD-10-CM | POA: Diagnosis not present

## 2022-05-26 DIAGNOSIS — M9901 Segmental and somatic dysfunction of cervical region: Secondary | ICD-10-CM | POA: Diagnosis not present

## 2022-05-26 DIAGNOSIS — S233XXA Sprain of ligaments of thoracic spine, initial encounter: Secondary | ICD-10-CM | POA: Diagnosis not present

## 2022-05-26 DIAGNOSIS — M9902 Segmental and somatic dysfunction of thoracic region: Secondary | ICD-10-CM | POA: Diagnosis not present

## 2022-05-26 DIAGNOSIS — M47812 Spondylosis without myelopathy or radiculopathy, cervical region: Secondary | ICD-10-CM | POA: Diagnosis not present

## 2022-05-31 DIAGNOSIS — M47816 Spondylosis without myelopathy or radiculopathy, lumbar region: Secondary | ICD-10-CM | POA: Diagnosis not present

## 2022-05-31 DIAGNOSIS — M47812 Spondylosis without myelopathy or radiculopathy, cervical region: Secondary | ICD-10-CM | POA: Diagnosis not present

## 2022-05-31 DIAGNOSIS — D352 Benign neoplasm of pituitary gland: Secondary | ICD-10-CM | POA: Diagnosis not present

## 2022-05-31 DIAGNOSIS — M9902 Segmental and somatic dysfunction of thoracic region: Secondary | ICD-10-CM | POA: Diagnosis not present

## 2022-05-31 DIAGNOSIS — M9903 Segmental and somatic dysfunction of lumbar region: Secondary | ICD-10-CM | POA: Diagnosis not present

## 2022-05-31 DIAGNOSIS — M9901 Segmental and somatic dysfunction of cervical region: Secondary | ICD-10-CM | POA: Diagnosis not present

## 2022-05-31 DIAGNOSIS — S233XXA Sprain of ligaments of thoracic spine, initial encounter: Secondary | ICD-10-CM | POA: Diagnosis not present

## 2022-06-14 DIAGNOSIS — M47816 Spondylosis without myelopathy or radiculopathy, lumbar region: Secondary | ICD-10-CM | POA: Diagnosis not present

## 2022-06-14 DIAGNOSIS — S233XXA Sprain of ligaments of thoracic spine, initial encounter: Secondary | ICD-10-CM | POA: Diagnosis not present

## 2022-06-14 DIAGNOSIS — M9901 Segmental and somatic dysfunction of cervical region: Secondary | ICD-10-CM | POA: Diagnosis not present

## 2022-06-14 DIAGNOSIS — M9903 Segmental and somatic dysfunction of lumbar region: Secondary | ICD-10-CM | POA: Diagnosis not present

## 2022-06-14 DIAGNOSIS — M47812 Spondylosis without myelopathy or radiculopathy, cervical region: Secondary | ICD-10-CM | POA: Diagnosis not present

## 2022-06-14 DIAGNOSIS — M9902 Segmental and somatic dysfunction of thoracic region: Secondary | ICD-10-CM | POA: Diagnosis not present

## 2022-06-21 DIAGNOSIS — M9902 Segmental and somatic dysfunction of thoracic region: Secondary | ICD-10-CM | POA: Diagnosis not present

## 2022-06-21 DIAGNOSIS — M47816 Spondylosis without myelopathy or radiculopathy, lumbar region: Secondary | ICD-10-CM | POA: Diagnosis not present

## 2022-06-21 DIAGNOSIS — M9903 Segmental and somatic dysfunction of lumbar region: Secondary | ICD-10-CM | POA: Diagnosis not present

## 2022-06-21 DIAGNOSIS — S233XXA Sprain of ligaments of thoracic spine, initial encounter: Secondary | ICD-10-CM | POA: Diagnosis not present

## 2022-06-21 DIAGNOSIS — M9901 Segmental and somatic dysfunction of cervical region: Secondary | ICD-10-CM | POA: Diagnosis not present

## 2022-06-21 DIAGNOSIS — M47812 Spondylosis without myelopathy or radiculopathy, cervical region: Secondary | ICD-10-CM | POA: Diagnosis not present

## 2022-06-28 DIAGNOSIS — M9901 Segmental and somatic dysfunction of cervical region: Secondary | ICD-10-CM | POA: Diagnosis not present

## 2022-06-28 DIAGNOSIS — M47812 Spondylosis without myelopathy or radiculopathy, cervical region: Secondary | ICD-10-CM | POA: Diagnosis not present

## 2022-06-28 DIAGNOSIS — S233XXA Sprain of ligaments of thoracic spine, initial encounter: Secondary | ICD-10-CM | POA: Diagnosis not present

## 2022-06-28 DIAGNOSIS — M9902 Segmental and somatic dysfunction of thoracic region: Secondary | ICD-10-CM | POA: Diagnosis not present

## 2022-06-28 DIAGNOSIS — M47816 Spondylosis without myelopathy or radiculopathy, lumbar region: Secondary | ICD-10-CM | POA: Diagnosis not present

## 2022-06-28 DIAGNOSIS — M9903 Segmental and somatic dysfunction of lumbar region: Secondary | ICD-10-CM | POA: Diagnosis not present

## 2022-07-05 DIAGNOSIS — S233XXA Sprain of ligaments of thoracic spine, initial encounter: Secondary | ICD-10-CM | POA: Diagnosis not present

## 2022-07-05 DIAGNOSIS — M9901 Segmental and somatic dysfunction of cervical region: Secondary | ICD-10-CM | POA: Diagnosis not present

## 2022-07-05 DIAGNOSIS — M47812 Spondylosis without myelopathy or radiculopathy, cervical region: Secondary | ICD-10-CM | POA: Diagnosis not present

## 2022-07-05 DIAGNOSIS — M47816 Spondylosis without myelopathy or radiculopathy, lumbar region: Secondary | ICD-10-CM | POA: Diagnosis not present

## 2022-07-05 DIAGNOSIS — M9903 Segmental and somatic dysfunction of lumbar region: Secondary | ICD-10-CM | POA: Diagnosis not present

## 2022-07-05 DIAGNOSIS — M9902 Segmental and somatic dysfunction of thoracic region: Secondary | ICD-10-CM | POA: Diagnosis not present

## 2022-07-11 DIAGNOSIS — M9902 Segmental and somatic dysfunction of thoracic region: Secondary | ICD-10-CM | POA: Diagnosis not present

## 2022-07-11 DIAGNOSIS — M47816 Spondylosis without myelopathy or radiculopathy, lumbar region: Secondary | ICD-10-CM | POA: Diagnosis not present

## 2022-07-11 DIAGNOSIS — M47812 Spondylosis without myelopathy or radiculopathy, cervical region: Secondary | ICD-10-CM | POA: Diagnosis not present

## 2022-07-11 DIAGNOSIS — M9901 Segmental and somatic dysfunction of cervical region: Secondary | ICD-10-CM | POA: Diagnosis not present

## 2022-07-11 DIAGNOSIS — M9903 Segmental and somatic dysfunction of lumbar region: Secondary | ICD-10-CM | POA: Diagnosis not present

## 2022-07-11 DIAGNOSIS — S233XXA Sprain of ligaments of thoracic spine, initial encounter: Secondary | ICD-10-CM | POA: Diagnosis not present

## 2022-07-19 ENCOUNTER — Other Ambulatory Visit: Payer: Medicare Other

## 2022-07-19 DIAGNOSIS — N138 Other obstructive and reflux uropathy: Secondary | ICD-10-CM | POA: Diagnosis not present

## 2022-07-19 DIAGNOSIS — N401 Enlarged prostate with lower urinary tract symptoms: Secondary | ICD-10-CM

## 2022-07-20 LAB — PSA: Prostate Specific Ag, Serum: <0.1 ng/mL (ref 0.0–4.0)

## 2022-07-21 DIAGNOSIS — S233XXA Sprain of ligaments of thoracic spine, initial encounter: Secondary | ICD-10-CM | POA: Diagnosis not present

## 2022-07-21 DIAGNOSIS — M9903 Segmental and somatic dysfunction of lumbar region: Secondary | ICD-10-CM | POA: Diagnosis not present

## 2022-07-21 DIAGNOSIS — M9902 Segmental and somatic dysfunction of thoracic region: Secondary | ICD-10-CM | POA: Diagnosis not present

## 2022-07-21 DIAGNOSIS — M47812 Spondylosis without myelopathy or radiculopathy, cervical region: Secondary | ICD-10-CM | POA: Diagnosis not present

## 2022-07-21 DIAGNOSIS — M9901 Segmental and somatic dysfunction of cervical region: Secondary | ICD-10-CM | POA: Diagnosis not present

## 2022-07-21 DIAGNOSIS — M47816 Spondylosis without myelopathy or radiculopathy, lumbar region: Secondary | ICD-10-CM | POA: Diagnosis not present

## 2022-07-26 ENCOUNTER — Other Ambulatory Visit: Payer: Medicare Other | Admitting: Urology

## 2022-07-28 DIAGNOSIS — M9902 Segmental and somatic dysfunction of thoracic region: Secondary | ICD-10-CM | POA: Diagnosis not present

## 2022-07-28 DIAGNOSIS — M47816 Spondylosis without myelopathy or radiculopathy, lumbar region: Secondary | ICD-10-CM | POA: Diagnosis not present

## 2022-07-28 DIAGNOSIS — M9903 Segmental and somatic dysfunction of lumbar region: Secondary | ICD-10-CM | POA: Diagnosis not present

## 2022-07-28 DIAGNOSIS — S233XXA Sprain of ligaments of thoracic spine, initial encounter: Secondary | ICD-10-CM | POA: Diagnosis not present

## 2022-07-28 DIAGNOSIS — M9901 Segmental and somatic dysfunction of cervical region: Secondary | ICD-10-CM | POA: Diagnosis not present

## 2022-07-28 DIAGNOSIS — M47812 Spondylosis without myelopathy or radiculopathy, cervical region: Secondary | ICD-10-CM | POA: Diagnosis not present

## 2022-08-02 DIAGNOSIS — M9903 Segmental and somatic dysfunction of lumbar region: Secondary | ICD-10-CM | POA: Diagnosis not present

## 2022-08-02 DIAGNOSIS — M9902 Segmental and somatic dysfunction of thoracic region: Secondary | ICD-10-CM | POA: Diagnosis not present

## 2022-08-02 DIAGNOSIS — M9901 Segmental and somatic dysfunction of cervical region: Secondary | ICD-10-CM | POA: Diagnosis not present

## 2022-08-02 DIAGNOSIS — S233XXA Sprain of ligaments of thoracic spine, initial encounter: Secondary | ICD-10-CM | POA: Diagnosis not present

## 2022-08-02 DIAGNOSIS — M47812 Spondylosis without myelopathy or radiculopathy, cervical region: Secondary | ICD-10-CM | POA: Diagnosis not present

## 2022-08-02 DIAGNOSIS — M47816 Spondylosis without myelopathy or radiculopathy, lumbar region: Secondary | ICD-10-CM | POA: Diagnosis not present

## 2022-08-09 DIAGNOSIS — M9901 Segmental and somatic dysfunction of cervical region: Secondary | ICD-10-CM | POA: Diagnosis not present

## 2022-08-09 DIAGNOSIS — M47812 Spondylosis without myelopathy or radiculopathy, cervical region: Secondary | ICD-10-CM | POA: Diagnosis not present

## 2022-08-09 DIAGNOSIS — M9902 Segmental and somatic dysfunction of thoracic region: Secondary | ICD-10-CM | POA: Diagnosis not present

## 2022-08-09 DIAGNOSIS — S233XXA Sprain of ligaments of thoracic spine, initial encounter: Secondary | ICD-10-CM | POA: Diagnosis not present

## 2022-08-09 DIAGNOSIS — M9903 Segmental and somatic dysfunction of lumbar region: Secondary | ICD-10-CM | POA: Diagnosis not present

## 2022-08-09 DIAGNOSIS — M47816 Spondylosis without myelopathy or radiculopathy, lumbar region: Secondary | ICD-10-CM | POA: Diagnosis not present

## 2022-08-16 DIAGNOSIS — S233XXA Sprain of ligaments of thoracic spine, initial encounter: Secondary | ICD-10-CM | POA: Diagnosis not present

## 2022-08-16 DIAGNOSIS — M9902 Segmental and somatic dysfunction of thoracic region: Secondary | ICD-10-CM | POA: Diagnosis not present

## 2022-08-16 DIAGNOSIS — M47816 Spondylosis without myelopathy or radiculopathy, lumbar region: Secondary | ICD-10-CM | POA: Diagnosis not present

## 2022-08-16 DIAGNOSIS — M9903 Segmental and somatic dysfunction of lumbar region: Secondary | ICD-10-CM | POA: Diagnosis not present

## 2022-08-16 DIAGNOSIS — M9901 Segmental and somatic dysfunction of cervical region: Secondary | ICD-10-CM | POA: Diagnosis not present

## 2022-08-16 DIAGNOSIS — M47812 Spondylosis without myelopathy or radiculopathy, cervical region: Secondary | ICD-10-CM | POA: Diagnosis not present

## 2022-08-19 ENCOUNTER — Other Ambulatory Visit: Payer: Self-pay | Admitting: Urology

## 2022-08-19 DIAGNOSIS — N138 Other obstructive and reflux uropathy: Secondary | ICD-10-CM

## 2022-08-23 DIAGNOSIS — E1142 Type 2 diabetes mellitus with diabetic polyneuropathy: Secondary | ICD-10-CM | POA: Diagnosis not present

## 2022-08-23 DIAGNOSIS — M79672 Pain in left foot: Secondary | ICD-10-CM | POA: Diagnosis not present

## 2022-08-23 DIAGNOSIS — M25572 Pain in left ankle and joints of left foot: Secondary | ICD-10-CM | POA: Diagnosis not present

## 2022-08-24 DIAGNOSIS — S233XXA Sprain of ligaments of thoracic spine, initial encounter: Secondary | ICD-10-CM | POA: Diagnosis not present

## 2022-08-24 DIAGNOSIS — M9901 Segmental and somatic dysfunction of cervical region: Secondary | ICD-10-CM | POA: Diagnosis not present

## 2022-08-24 DIAGNOSIS — M47816 Spondylosis without myelopathy or radiculopathy, lumbar region: Secondary | ICD-10-CM | POA: Diagnosis not present

## 2022-08-24 DIAGNOSIS — M9903 Segmental and somatic dysfunction of lumbar region: Secondary | ICD-10-CM | POA: Diagnosis not present

## 2022-08-24 DIAGNOSIS — M9902 Segmental and somatic dysfunction of thoracic region: Secondary | ICD-10-CM | POA: Diagnosis not present

## 2022-08-24 DIAGNOSIS — M47812 Spondylosis without myelopathy or radiculopathy, cervical region: Secondary | ICD-10-CM | POA: Diagnosis not present

## 2022-09-07 ENCOUNTER — Ambulatory Visit (INDEPENDENT_AMBULATORY_CARE_PROVIDER_SITE_OTHER): Payer: Medicare Other | Admitting: Urology

## 2022-09-07 VITALS — BP 104/64 | HR 73

## 2022-09-07 DIAGNOSIS — N138 Other obstructive and reflux uropathy: Secondary | ICD-10-CM

## 2022-09-07 DIAGNOSIS — R339 Retention of urine, unspecified: Secondary | ICD-10-CM

## 2022-09-07 DIAGNOSIS — C679 Malignant neoplasm of bladder, unspecified: Secondary | ICD-10-CM

## 2022-09-07 DIAGNOSIS — N401 Enlarged prostate with lower urinary tract symptoms: Secondary | ICD-10-CM

## 2022-09-07 DIAGNOSIS — Z8551 Personal history of malignant neoplasm of bladder: Secondary | ICD-10-CM | POA: Diagnosis not present

## 2022-09-07 MED ORDER — CIPROFLOXACIN HCL 500 MG PO TABS: 500.0000 mg | ORAL_TABLET | Freq: Once | ORAL | Status: AC

## 2022-09-07 MED ORDER — GEMTESA 75 MG PO TABS: 1.0000 | ORAL_TABLET | Freq: Every day | ORAL | 0 refills | Status: AC

## 2022-09-07 NOTE — Progress Notes (Signed)
09/07/2022 3:24 PM   Eric [name] 1946/01/24 [id number]  Referring provider: Juliette Alcide, MD [address] [place],  [place] [address]  Followup bladder cancer and BPH   HPI: Eric [name] is a 76yo here for followup for BPH and bladder cancer. IPSS 14 QOL 2 on flomax 0.4mg  and gemtesa 75mg  daily. No straining to urinate. He has bothersome urinary frequency and urgency. Urine stream strong. No straining to urinate. 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   Neuropathy    Pneumonia 09/25/2020    Surgical History: Past Surgical History:  Procedure Laterality Date   BIOPSY  09/29/2016   Procedure: BIOPSY;  Surgeon: Malissa Hippo, MD;  [facility];  Service: Endoscopy;;  colon   BIOPSY  11/14/2019   Procedure: BIOPSY;  Surgeon: Malissa Hippo, MD;  [facility];  Service: Endoscopy;;   BLADDER SURGERY     2013   BRONCHIAL BIOPSY  02/02/2021   Procedure: BRONCHIAL BIOPSIES;  Surgeon: Josephine Igo, DO;  [facility]: [facility];  Service: Pulmonary;;   BRONCHIAL BRUSHINGS  02/02/2021   Procedure: BRONCHIAL BRUSHINGS;  Surgeon: Josephine Igo, DO;  Location: [facility];  Service: Pulmonary;;   BRONCHIAL NEEDLE ASPIRATION BIOPSY  02/02/2021   Procedure: BRONCHIAL NEEDLE ASPIRATION BIOPSIES;  Surgeon: Josephine Igo, DO;  Location: [facility];  Service: Pulmonary;;   BRONCHIAL WASHINGS  02/02/2021   Procedure: BRONCHIAL WASHINGS;  Surgeon: Josephine Igo, DO;  Location: [facility];  Service: Pulmonary;;   CHOLECYSTECTOMY     COLONOSCOPY N/A 09/29/2016   Procedure:  COLONOSCOPY;  Surgeon: Malissa Hippo, MD;  [facility];  Service: Endoscopy;  Laterality: N/A;  12:00   COLONOSCOPY N/A 11/14/2019   Procedure: COLONOSCOPY;  Surgeon: Malissa Hippo, MD;  [facility];  Service: Endoscopy;  Laterality: N/A;  125, per office, pt knows new arrival time   CRANIOTOMY N/A 04/24/2020   Procedure: 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;  Surgeon: Coletta Memos, MD;  [facility];  Service: Neurosurgery;  Laterality: N/A;  3C/RM 20   ESOPHAGOGASTRODUODENOSCOPY N/A 11/14/2019   Procedure: ESOPHAGOGASTRODUODENOSCOPY (EGD);  Surgeon: Malissa Hippo, MD;  [facility];  Service: Endoscopy;  Laterality: N/A;   FIDUCIAL MARKER PLACEMENT  02/02/2021   Procedure: FIDUCIAL MARKER PLACEMENT;  Surgeon: Josephine Igo, DO;  [facility]: [facility];  Service: Pulmonary;;   knee athroscopy     TONSILLECTOMY     as child   TRANSPHENOIDAL APPROACH EXPOSURE N/A 04/24/2020   Procedure: TRANSPHENOIDAL APPROACH EXPOSURE;  Surgeon: Osborn Coho, MD;  [facility];  Service: ENT;  Laterality: N/A;   VIDEO BRONCHOSCOPY WITH RADIAL ENDOBRONCHIAL ULTRASOUND  02/02/2021   Procedure: RADIAL ENDOBRONCHIAL ULTRASOUND;  Surgeon: Josephine Igo, DO;  Location: [facility];  Service: Pulmonary;;    Home Medications:  Allergies as of 09/07/2022       Reactions   Metformin And Related Other (See Comments)   Intolerance- "caused bladder cancer"  Medication List        Accurate as of September 07, 2022  3:24 PM. If you have any questions, ask your nurse or doctor.          B-complex with vitamin C tablet Take 1 tablet by mouth daily.   Blood Sugar Balance Tabs Take 1 tablet by mouth in the morning and at bedtime. Glucocil   CoQ10 200 MG Caps Take 200 mg by mouth daily.   dutasteride 0.5 MG capsule Commonly known as: AVODART Take 1 capsule (0.5 mg total) by mouth daily.   Flaxseed Oil 1000 MG  Caps Take 1,000 mg by mouth daily.   gabapentin 300 MG capsule Commonly known as: NEURONTIN Take 300 mg by mouth 3 (three) times daily.   lisinopril 10 MG tablet Commonly known as: ZESTRIL Take 10 mg by mouth daily.   multivitamin with minerals Tabs tablet Take 1 tablet by mouth daily.   OVER THE COUNTER MEDICATION Take 1 capsule by mouth daily. Cellwise   OVER THE COUNTER MEDICATION Take 1 capsule by mouth daily. Blueberry   OVER THE COUNTER MEDICATION Take 1 tablet by mouth daily. Replenex Extra Strength   OVER THE COUNTER MEDICATION Take 1 tablet by mouth daily. Acid-A-Cal   psyllium 58.6 % powder Commonly known as: METAMUCIL Take 1 packet by mouth daily.   rosuvastatin 10 MG tablet Commonly known as: CRESTOR Take 10 mg by mouth daily.   tamsulosin 0.4 MG Caps capsule Commonly known as: FLOMAX take 1 capsule by mouth twice daily.   vitamin C 1000 MG tablet Take 1,000 mg by mouth in the morning and at bedtime.   Vitamin D3 50 MCG (2000 UT) Tabs Take 2,000 Units by mouth daily.   VITAMIN K2-VITAMIN D3 PO Take 1 tablet by mouth daily.   Zinc 50 MG Tabs Take 50 mg by mouth daily.   zolpidem 5 MG tablet Commonly known as: AMBIEN Take 5 mg by mouth at bedtime as needed for sleep.        Allergies:  Allergies  Allergen Reactions   Metformin And Related Other (See Comments)    Intolerance- "caused bladder cancer"    Family History: Family History  Problem Relation Age of Onset   Hypercholesterolemia Father    Hypercholesterolemia Mother    Colon cancer Neg Hx    Urolithiasis Neg Hx    Sudden death Neg Hx    Lupus Neg Hx    Sickle cell trait Neg Hx     Social History:  reports that he quit smoking about 64 years ago. His smoking use included cigarettes. He started smoking about 65 years ago. He smoked an average of .25 packs per day. He has never been exposed to tobacco smoke. He has quit using smokeless tobacco.  His smokeless tobacco use  included chew. He reports that he does not currently use alcohol. He reports that he does not use drugs.  ROS: All other review of systems were reviewed and are negative except what is noted above in HPI  Physical Exam: BP 104/64   Pulse 73   Constitutional:  Alert and oriented, No acute distress. HEENT: [place] AT, moist mucus membranes.  Trachea midline, no masses. Cardiovascular: No clubbing, cyanosis, or edema. Respiratory: Normal respiratory effort, no increased work of breathing. GI: Abdomen is soft, nontender, nondistended, no abdominal masses GU: No CVA tenderness.  Lymph: No cervical or inguinal lymphadenopathy. Skin: No rashes, bruises or suspicious lesions. Neurologic: Grossly intact, no focal deficits, moving all  4 extremities. Psychiatric: Normal mood and affect.  Laboratory Data: Lab Results  Component Value Date   WBC 5.6 02/02/2021   HGB 13.2 02/02/2021   HCT 41.7 02/02/2021   MCV 93.3 02/02/2021   PLT 135 (L) 02/02/2021    Lab Results  Component Value Date   CREATININE 1.08 02/02/2021    No results found for: "PSA"  No results found for: "TESTOSTERONE"  Lab Results  Component Value Date   HGBA1C 6.5 (H) 09/26/2020    Urinalysis    Component Value Date/Time   COLORURINE YELLOW 09/26/2020 2230   APPEARANCEUR Clear 07/20/2021 1027   LABSPEC 1.005 09/26/2020 2230   PHURINE 6.0 09/26/2020 2230   GLUCOSEU Negative 07/20/2021 1027   HGBUR SMALL (A) 09/26/2020 2230   BILIRUBINUR Negative 07/20/2021 1027   KETONESUR NEGATIVE 09/26/2020 2230   PROTEINUR Negative 07/20/2021 1027   PROTEINUR NEGATIVE 09/26/2020 2230   NITRITE Negative 07/20/2021 1027   NITRITE NEGATIVE 09/26/2020 2230   LEUKOCYTESUR Negative 07/20/2021 1027   LEUKOCYTESUR NEGATIVE 09/26/2020 2230    Lab Results  Component Value Date   LABMICR See below: 07/20/2021   WBCUA None seen 07/20/2021   LABEPIT None seen 07/20/2021   BACTERIA None seen 07/20/2021    Pertinent  Imaging:  No results found for this or any previous visit.  No results found for this or any previous visit.  No results found for this or any previous visit.  No results found for this or any previous visit.  No results found for this or any previous visit.  No valid procedures specified. No results found for this or any previous visit.  No results found for this or any previous visit.   Assessment & Plan:    1. Malignant neoplasm of urinary bladder, unspecified site [facility]) -followup 1 year for cystoscopy - Urinalysis, Routine w reflex microscopic - ciprofloxacin (CIPRO) tablet 500 mg - Cystoscopy (Bedside)  2. Benign prostatic hyperplasia with urinary obstruction -continue flomax and gemtesa  3. Incomplete emptying of bladder -continue flomax   No follow-ups on file.  Wilkie Aye, MD  [facility] [place]   Cystoscopy Procedure Note  Patient identification was confirmed, informed consent was obtained, and patient was prepped using Betadine solution.  Lidocaine jelly was administered per urethral meatus.     Pre-Procedure: - Inspection reveals a normal caliber ureteral meatus.  Procedure: The flexible cystoscope was introduced without difficulty - No urethral strictures/lesions are present. - Enlarged prostate  - Normal bladder neck - Bilateral ureteral orifices identified - Bladder mucosa  reveals no ulcers, tumors, or lesions - No bladder stones - No trabeculation    Post-Procedure: - Patient tolerated the procedure well  Assessment/ Plan: Followup 1 year for cystocopy  No follow-ups on file.  Wilkie Aye, MD

## 2022-09-08 DIAGNOSIS — M9901 Segmental and somatic dysfunction of cervical region: Secondary | ICD-10-CM | POA: Diagnosis not present

## 2022-09-08 DIAGNOSIS — M9902 Segmental and somatic dysfunction of thoracic region: Secondary | ICD-10-CM | POA: Diagnosis not present

## 2022-09-08 DIAGNOSIS — M47816 Spondylosis without myelopathy or radiculopathy, lumbar region: Secondary | ICD-10-CM | POA: Diagnosis not present

## 2022-09-08 DIAGNOSIS — M47812 Spondylosis without myelopathy or radiculopathy, cervical region: Secondary | ICD-10-CM | POA: Diagnosis not present

## 2022-09-08 DIAGNOSIS — S233XXA Sprain of ligaments of thoracic spine, initial encounter: Secondary | ICD-10-CM | POA: Diagnosis not present

## 2022-09-08 DIAGNOSIS — M9903 Segmental and somatic dysfunction of lumbar region: Secondary | ICD-10-CM | POA: Diagnosis not present

## 2022-09-08 LAB — URINALYSIS, ROUTINE W REFLEX MICROSCOPIC
Bilirubin, UA: NEGATIVE
Glucose, UA: NEGATIVE
Ketones, UA: NEGATIVE
Leukocytes,UA: NEGATIVE
Nitrite, UA: NEGATIVE
Protein,UA: NEGATIVE
RBC, UA: NEGATIVE
Specific Gravity, UA: 1.02 (ref 1.005–1.030)
Urobilinogen, Ur: 0.2 mg/dL (ref 0.2–1.0)
pH, UA: 5.5 (ref 5.0–7.5)

## 2022-09-11 ENCOUNTER — Encounter: Payer: Self-pay | Admitting: Urology

## 2022-09-11 MED ORDER — TAMSULOSIN HCL 0.4 MG PO CAPS: 0.4000 mg | ORAL_CAPSULE | Freq: Two times a day (BID) | ORAL | 3 refills | Status: DC

## 2022-09-11 NOTE — Patient Instructions (Signed)

## 2022-09-13 ENCOUNTER — Other Ambulatory Visit: Payer: Self-pay | Admitting: Urology

## 2022-09-13 DIAGNOSIS — M9903 Segmental and somatic dysfunction of lumbar region: Secondary | ICD-10-CM | POA: Diagnosis not present

## 2022-09-13 DIAGNOSIS — N401 Enlarged prostate with lower urinary tract symptoms: Secondary | ICD-10-CM

## 2022-09-13 DIAGNOSIS — S233XXA Sprain of ligaments of thoracic spine, initial encounter: Secondary | ICD-10-CM | POA: Diagnosis not present

## 2022-09-13 DIAGNOSIS — M47812 Spondylosis without myelopathy or radiculopathy, cervical region: Secondary | ICD-10-CM | POA: Diagnosis not present

## 2022-09-13 DIAGNOSIS — M47816 Spondylosis without myelopathy or radiculopathy, lumbar region: Secondary | ICD-10-CM | POA: Diagnosis not present

## 2022-09-13 DIAGNOSIS — M9902 Segmental and somatic dysfunction of thoracic region: Secondary | ICD-10-CM | POA: Diagnosis not present

## 2022-09-13 DIAGNOSIS — M9901 Segmental and somatic dysfunction of cervical region: Secondary | ICD-10-CM | POA: Diagnosis not present

## 2022-09-20 DIAGNOSIS — M47812 Spondylosis without myelopathy or radiculopathy, cervical region: Secondary | ICD-10-CM | POA: Diagnosis not present

## 2022-09-20 DIAGNOSIS — S233XXA Sprain of ligaments of thoracic spine, initial encounter: Secondary | ICD-10-CM | POA: Diagnosis not present

## 2022-09-20 DIAGNOSIS — M9901 Segmental and somatic dysfunction of cervical region: Secondary | ICD-10-CM | POA: Diagnosis not present

## 2022-09-20 DIAGNOSIS — M9903 Segmental and somatic dysfunction of lumbar region: Secondary | ICD-10-CM | POA: Diagnosis not present

## 2022-09-20 DIAGNOSIS — M47816 Spondylosis without myelopathy or radiculopathy, lumbar region: Secondary | ICD-10-CM | POA: Diagnosis not present

## 2022-09-20 DIAGNOSIS — M9902 Segmental and somatic dysfunction of thoracic region: Secondary | ICD-10-CM | POA: Diagnosis not present

## 2022-09-27 DIAGNOSIS — M9901 Segmental and somatic dysfunction of cervical region: Secondary | ICD-10-CM | POA: Diagnosis not present

## 2022-09-27 DIAGNOSIS — S233XXA Sprain of ligaments of thoracic spine, initial encounter: Secondary | ICD-10-CM | POA: Diagnosis not present

## 2022-09-27 DIAGNOSIS — M9902 Segmental and somatic dysfunction of thoracic region: Secondary | ICD-10-CM | POA: Diagnosis not present

## 2022-09-27 DIAGNOSIS — M47812 Spondylosis without myelopathy or radiculopathy, cervical region: Secondary | ICD-10-CM | POA: Diagnosis not present

## 2022-09-27 DIAGNOSIS — M47816 Spondylosis without myelopathy or radiculopathy, lumbar region: Secondary | ICD-10-CM | POA: Diagnosis not present

## 2022-09-27 DIAGNOSIS — M9903 Segmental and somatic dysfunction of lumbar region: Secondary | ICD-10-CM | POA: Diagnosis not present

## 2022-09-28 DIAGNOSIS — I10 Essential (primary) hypertension: Secondary | ICD-10-CM | POA: Diagnosis not present

## 2022-09-28 DIAGNOSIS — Z1159 Encounter for screening for other viral diseases: Secondary | ICD-10-CM | POA: Diagnosis not present

## 2022-09-28 DIAGNOSIS — Z13220 Encounter for screening for lipoid disorders: Secondary | ICD-10-CM | POA: Diagnosis not present

## 2022-09-28 DIAGNOSIS — Z1329 Encounter for screening for other suspected endocrine disorder: Secondary | ICD-10-CM | POA: Diagnosis not present

## 2022-09-28 DIAGNOSIS — E1169 Type 2 diabetes mellitus with other specified complication: Secondary | ICD-10-CM | POA: Diagnosis not present

## 2022-09-28 DIAGNOSIS — Z0000 Encounter for general adult medical examination without abnormal findings: Secondary | ICD-10-CM | POA: Diagnosis not present

## 2022-10-05 DIAGNOSIS — I700 Atherosclerosis of aorta: Secondary | ICD-10-CM | POA: Diagnosis not present

## 2022-10-05 DIAGNOSIS — M4722 Other spondylosis with radiculopathy, cervical region: Secondary | ICD-10-CM | POA: Diagnosis not present

## 2022-10-05 DIAGNOSIS — D696 Thrombocytopenia, unspecified: Secondary | ICD-10-CM | POA: Diagnosis not present

## 2022-10-05 DIAGNOSIS — R911 Solitary pulmonary nodule: Secondary | ICD-10-CM | POA: Diagnosis not present

## 2022-10-05 DIAGNOSIS — M5011 Cervical disc disorder with radiculopathy,  high cervical region: Secondary | ICD-10-CM | POA: Diagnosis not present

## 2022-10-05 DIAGNOSIS — G4700 Insomnia, unspecified: Secondary | ICD-10-CM | POA: Diagnosis not present

## 2022-10-05 DIAGNOSIS — I10 Essential (primary) hypertension: Secondary | ICD-10-CM | POA: Diagnosis not present

## 2022-10-05 DIAGNOSIS — E1169 Type 2 diabetes mellitus with other specified complication: Secondary | ICD-10-CM | POA: Diagnosis not present

## 2022-10-05 DIAGNOSIS — G2581 Restless legs syndrome: Secondary | ICD-10-CM | POA: Diagnosis not present

## 2022-10-05 DIAGNOSIS — C679 Malignant neoplasm of bladder, unspecified: Secondary | ICD-10-CM | POA: Diagnosis not present

## 2022-10-05 DIAGNOSIS — Z6824 Body mass index (BMI) 24.0-24.9, adult: Secondary | ICD-10-CM | POA: Diagnosis not present

## 2022-10-05 DIAGNOSIS — M5412 Radiculopathy, cervical region: Secondary | ICD-10-CM | POA: Diagnosis not present

## 2022-10-05 DIAGNOSIS — Z0001 Encounter for general adult medical examination with abnormal findings: Secondary | ICD-10-CM | POA: Diagnosis not present

## 2022-10-24 DIAGNOSIS — S233XXA Sprain of ligaments of thoracic spine, initial encounter: Secondary | ICD-10-CM | POA: Diagnosis not present

## 2022-10-24 DIAGNOSIS — M9901 Segmental and somatic dysfunction of cervical region: Secondary | ICD-10-CM | POA: Diagnosis not present

## 2022-10-24 DIAGNOSIS — M47812 Spondylosis without myelopathy or radiculopathy, cervical region: Secondary | ICD-10-CM | POA: Diagnosis not present

## 2022-10-24 DIAGNOSIS — M9902 Segmental and somatic dysfunction of thoracic region: Secondary | ICD-10-CM | POA: Diagnosis not present

## 2022-10-24 DIAGNOSIS — M47816 Spondylosis without myelopathy or radiculopathy, lumbar region: Secondary | ICD-10-CM | POA: Diagnosis not present

## 2022-10-24 DIAGNOSIS — M9903 Segmental and somatic dysfunction of lumbar region: Secondary | ICD-10-CM | POA: Diagnosis not present

## 2022-11-01 DIAGNOSIS — S233XXA Sprain of ligaments of thoracic spine, initial encounter: Secondary | ICD-10-CM | POA: Diagnosis not present

## 2022-11-01 DIAGNOSIS — M47816 Spondylosis without myelopathy or radiculopathy, lumbar region: Secondary | ICD-10-CM | POA: Diagnosis not present

## 2022-11-01 DIAGNOSIS — M9902 Segmental and somatic dysfunction of thoracic region: Secondary | ICD-10-CM | POA: Diagnosis not present

## 2022-11-01 DIAGNOSIS — M9903 Segmental and somatic dysfunction of lumbar region: Secondary | ICD-10-CM | POA: Diagnosis not present

## 2022-11-01 DIAGNOSIS — M47812 Spondylosis without myelopathy or radiculopathy, cervical region: Secondary | ICD-10-CM | POA: Diagnosis not present

## 2022-11-01 DIAGNOSIS — M9901 Segmental and somatic dysfunction of cervical region: Secondary | ICD-10-CM | POA: Diagnosis not present

## 2022-11-08 DIAGNOSIS — M9902 Segmental and somatic dysfunction of thoracic region: Secondary | ICD-10-CM | POA: Diagnosis not present

## 2022-11-08 DIAGNOSIS — M47816 Spondylosis without myelopathy or radiculopathy, lumbar region: Secondary | ICD-10-CM | POA: Diagnosis not present

## 2022-11-08 DIAGNOSIS — M47812 Spondylosis without myelopathy or radiculopathy, cervical region: Secondary | ICD-10-CM | POA: Diagnosis not present

## 2022-11-08 DIAGNOSIS — M9903 Segmental and somatic dysfunction of lumbar region: Secondary | ICD-10-CM | POA: Diagnosis not present

## 2022-11-08 DIAGNOSIS — H43813 Vitreous degeneration, bilateral: Secondary | ICD-10-CM | POA: Diagnosis not present

## 2022-11-08 DIAGNOSIS — S233XXA Sprain of ligaments of thoracic spine, initial encounter: Secondary | ICD-10-CM | POA: Diagnosis not present

## 2022-11-08 DIAGNOSIS — M9901 Segmental and somatic dysfunction of cervical region: Secondary | ICD-10-CM | POA: Diagnosis not present

## 2022-11-15 DIAGNOSIS — S233XXA Sprain of ligaments of thoracic spine, initial encounter: Secondary | ICD-10-CM | POA: Diagnosis not present

## 2022-11-15 DIAGNOSIS — M47816 Spondylosis without myelopathy or radiculopathy, lumbar region: Secondary | ICD-10-CM | POA: Diagnosis not present

## 2022-11-15 DIAGNOSIS — M47812 Spondylosis without myelopathy or radiculopathy, cervical region: Secondary | ICD-10-CM | POA: Diagnosis not present

## 2022-11-15 DIAGNOSIS — M9902 Segmental and somatic dysfunction of thoracic region: Secondary | ICD-10-CM | POA: Diagnosis not present

## 2022-11-15 DIAGNOSIS — M9903 Segmental and somatic dysfunction of lumbar region: Secondary | ICD-10-CM | POA: Diagnosis not present

## 2022-11-15 DIAGNOSIS — M9901 Segmental and somatic dysfunction of cervical region: Secondary | ICD-10-CM | POA: Diagnosis not present

## 2022-11-19 IMAGING — MR MR HEAD WO/W CM
14 of 16 series · 31 of 48 positions shown · IV contrast (gadavist)
Comparison: Head CT 09/26/2020 and earlier, including preoperative
head CT 02/11/2020. No prior brain MRI can be identified today

CLINICAL DATA: 74-year-old male status post surgery for pituitary
adenoma in [REDACTED]. Status post subdural hematoma in [place].

EXAM:
MRI HEAD WITHOUT AND WITH CONTRAST
TECHNIQUE: Multiplanar, multiecho pulse sequences of the brain and surrounding
structures were obtained without and with intravenous contrast.
CONTRAST:  7mL GADAVIST GADOBUTROL 1 MMOL/ML IV SOLN

[Series 9: DWI · axial · 3.0mm · 0.80mm/px · z∈[-66,+71]mm · 3 of 46 slices shown (1 of 2)]
[im 1/46]
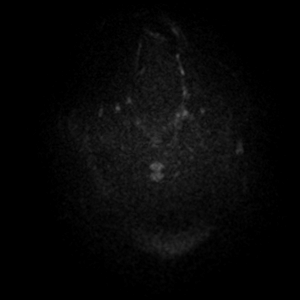
[im 23/46]
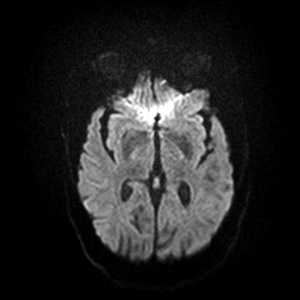
[im 46/46]
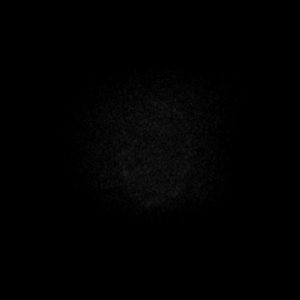

[Series 10: DWI · axial · 3.0mm · 0.80mm/px · z∈[-66,+71]mm · 3 of 46 slices shown (2 of 2)]
[im 1/46]
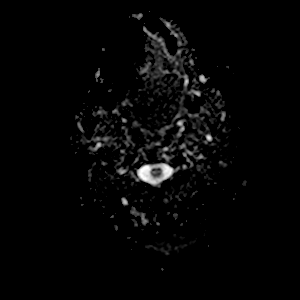
[im 23/46]
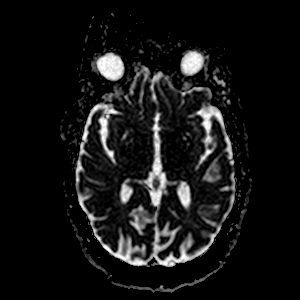
[im 46/46]
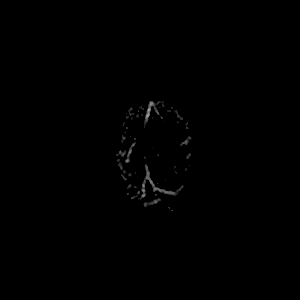

[Series 11: T1 · sagittal · 5.0mm · 0.78mm/px · 1 of 21 slices shown (1 of 4)]
[im 1/21]
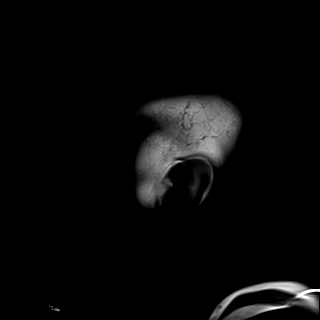

[Series 12: T2 · axial · 5.0mm · 0.75mm/px · z∈[-48,+74]mm · 2 of 22 slices shown (1 of 2)]
[im 1/22]
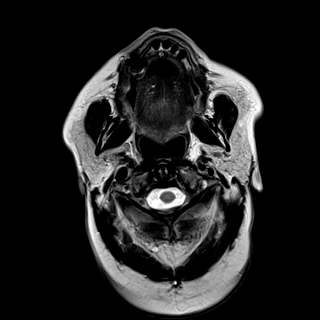
[im 22/22]
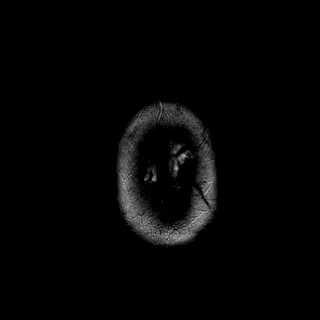

[Series 13: ax hemo · axial · 5.0mm · 0.94mm/px · z∈[-52,+76]mm · 2 of 27 slices shown]
[im 1/27]
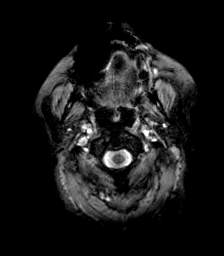
[im 27/27]
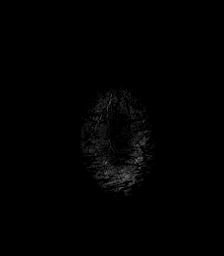

[Series 14: FLAIR · axial · 3.0mm · 0.47mm/px · z∈[-52,+75]mm · 4 of 52 slices shown]
[im 1/52]
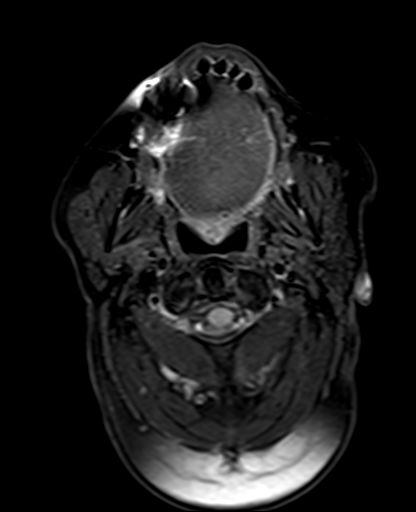
[im 18/52]
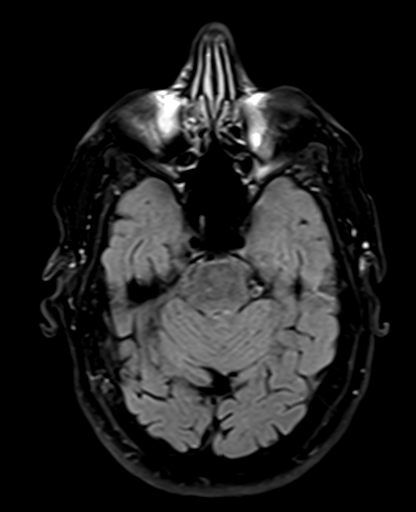
[im 35/52]
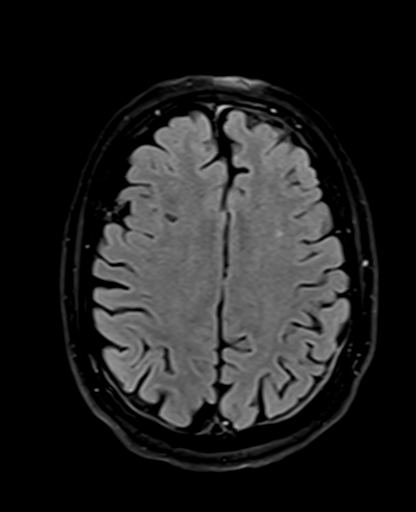
[im 52/52]
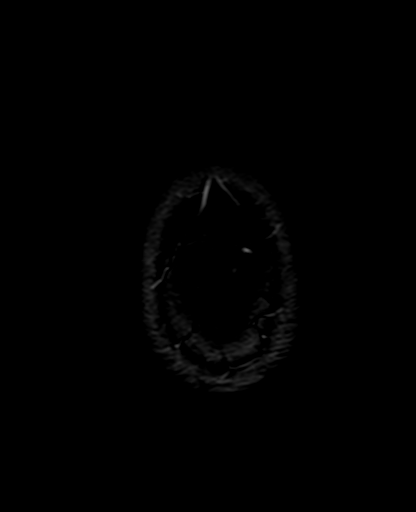

[Series 15: T1 · sagittal · 3.0mm · 0.25mm/px · 1 of 12 slices shown (2 of 4)]
[im 1/12]
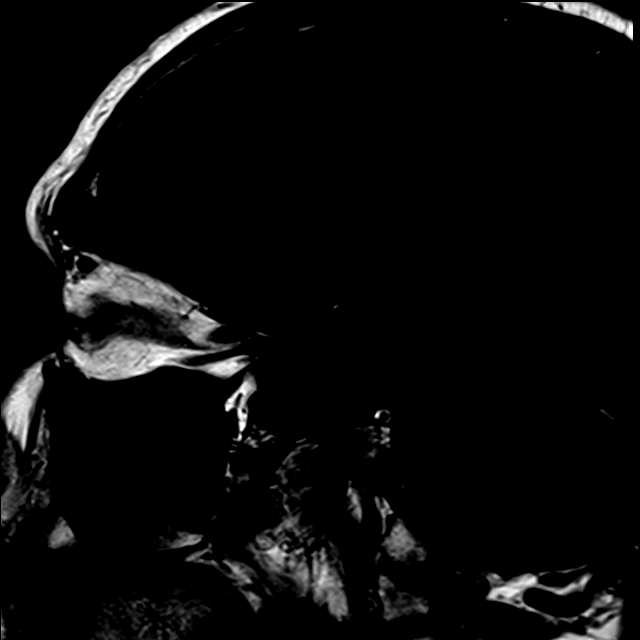

[Series 16: T1 · coronal · 3.0mm · 0.25mm/px · 1 of 13 slices shown (3 of 4)]
[im 1/13]
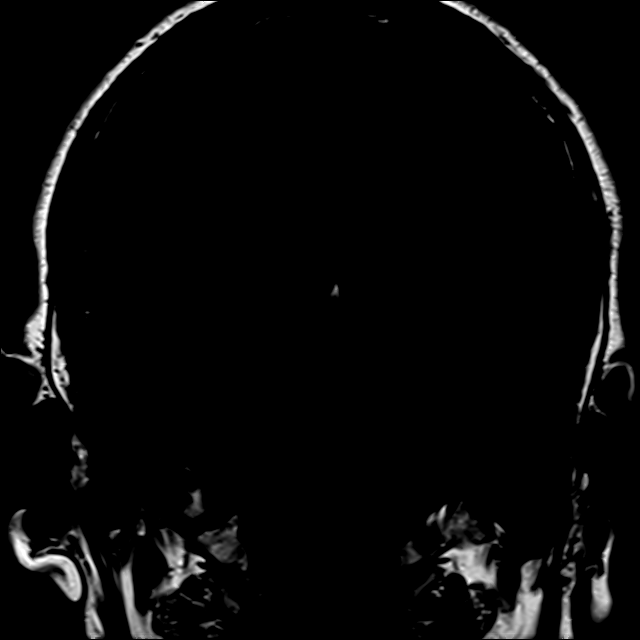

[Series 17: T2 · coronal · 3.0mm · 0.42mm/px · 1 of 13 slices shown (2 of 2)]
[im 1/13]
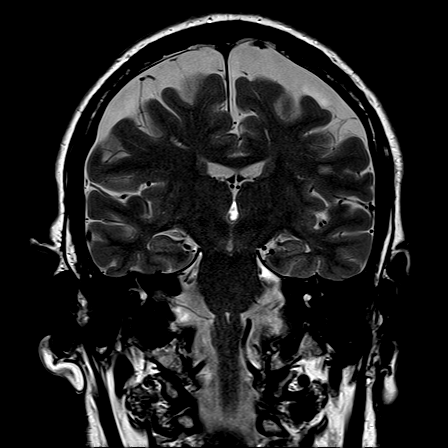

[Series 18: T1 · axial · 1.0mm · 0.98mm/px · z∈[-51,+80]mm · 8 of 160 slices shown (4 of 4)]
[im 1/160]
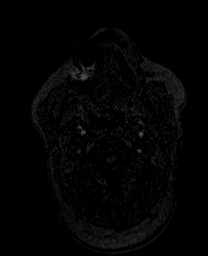
[im 32/160]
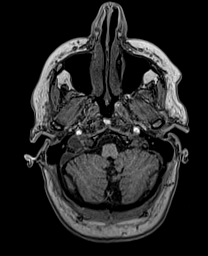
[im 48/160]
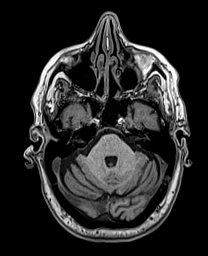
[im 64/160]
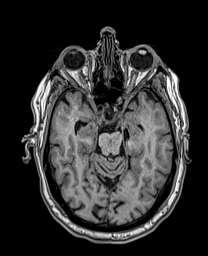
[im 96/160]
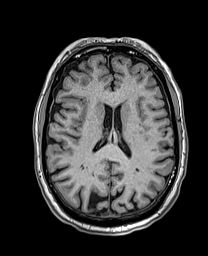
[im 112/160]
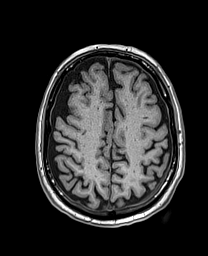
[im 128/160]
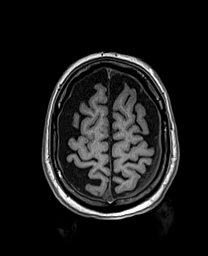
[im 160/160]
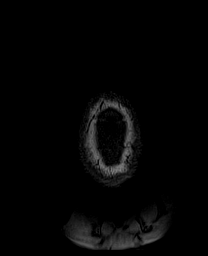

[Series 19: t1_tse_cor_dynamic pre · coronal · non-contrast · 3.0mm · 0.49mm/px · 1 of 7 slices shown]
[im 1/7]
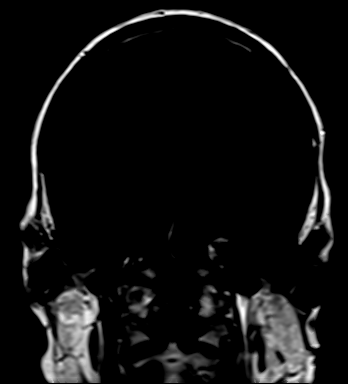

[Series 20: t1_tse_cor_dynamic post · coronal · 3.0mm · 0.49mm/px · 2 of 42 slices shown]
[im 1/42]
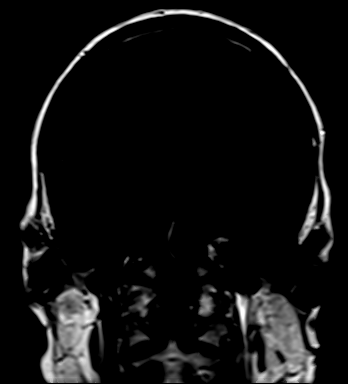
[im 21/42]
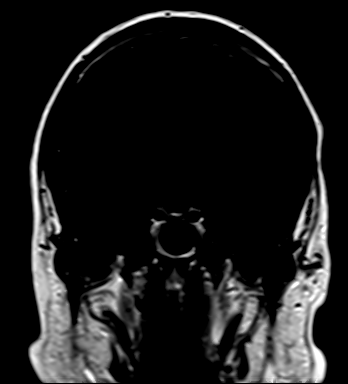

[Series 22: T1 post-contrast · sagittal · 3.0mm · 0.25mm/px · 1 of 12 slices shown (1 of 2)]
[im 1/12]
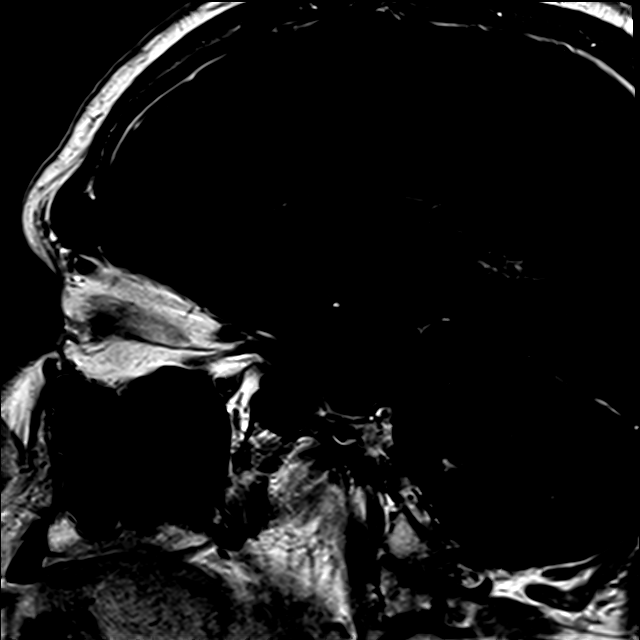

[Series 23: T1 post-contrast · coronal · 3.0mm · 0.25mm/px · 1 of 13 slices shown (2 of 2)]
[im 1/13]
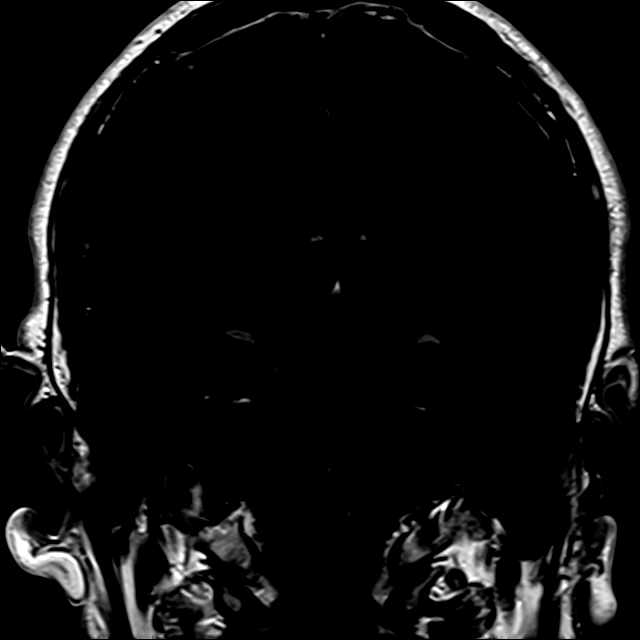

[31 of 48 positions shown; findings below may reference images not displayed]

FINDINGS: Brain: Pituitary is detailed below.

Overall cerebral volume appears stable since last year.

Left side subdural hematoma has regressed since [place] but is not
completely resolved. A small 5-6 mm thick residual is noted over the
left parietal convexity on series 14, image 43 and series 11, image
16. A small anterior component is visible on series 14, image 38.
Following contrast there is associated dural thickening and
enhancement there (series 24, image 130). And there is questionable
trace contralateral right posterior convexity SDH (series 14, image
35 and series 24, image 106).

No intracranial mass effect or midline shift. No intraventricular
hemorrhage or ventriculomegaly. No definite subarachnoid hemorrhage.
No convincing parenchymal hemosiderin. No restricted diffusion or
evidence of acute infarction. Normal basilar cisterns and
cervicomedullary junction.

Scattered mild for age nonspecific cerebral white matter T2 and
FLAIR hyperintensity, more pronounced in the left frontal lobe. No
cortical encephalomalacia identified. Deep gray matter nuclei,
brainstem and cerebellum are within normal limits.

Aside from the pituitary findings and SDH related dural thickening,
no abnormal intracranial enhancement is identified.

Vascular: Major intracranial vascular flow voids are preserved.
Following contrast the major dural venous sinuses are enhancing and
appear to be patent.

Skull and upper cervical spine: Negative visible cervical spine.
Visualized bone marrow signal is within normal limits.

Sinuses/Orbits: Negative orbits. Paranasal sinuses are clear, with
postoperative changes to both sphenoid sinuses. Mastoids are well
aerated. Visible internal auditory structures appear normal.

Other: Dedicated pituitary imaging. Partially empty sella appearance
now. Some T1 intrinsic hemosiderin or possibly adipose tissue is
noted along the anterior central sella eccentric to the left (series
15, image 7 and series 16, image 9). There is mild symmetric
appearing increased cavernous sinus soft tissue bilaterally on
series 16, image 8. But following contrast, there is a discrete 5 mm
round focus of hypoenhancement within the left sella abutting the
ICA siphon on series 23, image 6. This is somewhat posteriorly
located. No skull base invasion. Diminutive and fenestrated
appearance of the infundibulum. No suprasellar mass or mass effect.
Negative hypothalamus.
IMPRESSION: 1. Satisfactory postoperative appearance of the pituitary:
Largely empty sella appearance now, with a small 5 mm area of
residual adenoma along the left posterior sella.
Questionable residual bilateral cavernous sinus soft tissue (series
16, image 8), to which attention is directed on follow-up.
No regional mass effect.

2. Small residual Left side Subdural Hematoma, up to 6 mm in
thickness (versus 12 mm in [place]). Trace right posterior SDH also
suspected. Associated mild dural thickening and enhancement. No
intracranial mass effect.

3. No new intracranial abnormality.

## 2022-11-22 DIAGNOSIS — M9902 Segmental and somatic dysfunction of thoracic region: Secondary | ICD-10-CM | POA: Diagnosis not present

## 2022-11-22 DIAGNOSIS — S233XXA Sprain of ligaments of thoracic spine, initial encounter: Secondary | ICD-10-CM | POA: Diagnosis not present

## 2022-11-22 DIAGNOSIS — M9901 Segmental and somatic dysfunction of cervical region: Secondary | ICD-10-CM | POA: Diagnosis not present

## 2022-11-22 DIAGNOSIS — M47816 Spondylosis without myelopathy or radiculopathy, lumbar region: Secondary | ICD-10-CM | POA: Diagnosis not present

## 2022-11-22 DIAGNOSIS — M9903 Segmental and somatic dysfunction of lumbar region: Secondary | ICD-10-CM | POA: Diagnosis not present

## 2022-11-22 DIAGNOSIS — M47812 Spondylosis without myelopathy or radiculopathy, cervical region: Secondary | ICD-10-CM | POA: Diagnosis not present

## 2022-11-29 DIAGNOSIS — M9903 Segmental and somatic dysfunction of lumbar region: Secondary | ICD-10-CM | POA: Diagnosis not present

## 2022-11-29 DIAGNOSIS — M47816 Spondylosis without myelopathy or radiculopathy, lumbar region: Secondary | ICD-10-CM | POA: Diagnosis not present

## 2022-11-29 DIAGNOSIS — M9901 Segmental and somatic dysfunction of cervical region: Secondary | ICD-10-CM | POA: Diagnosis not present

## 2022-11-29 DIAGNOSIS — S233XXA Sprain of ligaments of thoracic spine, initial encounter: Secondary | ICD-10-CM | POA: Diagnosis not present

## 2022-11-29 DIAGNOSIS — M9902 Segmental and somatic dysfunction of thoracic region: Secondary | ICD-10-CM | POA: Diagnosis not present

## 2022-11-29 DIAGNOSIS — M47812 Spondylosis without myelopathy or radiculopathy, cervical region: Secondary | ICD-10-CM | POA: Diagnosis not present

## 2022-12-01 DIAGNOSIS — E1169 Type 2 diabetes mellitus with other specified complication: Secondary | ICD-10-CM | POA: Diagnosis not present

## 2022-12-01 DIAGNOSIS — Z6825 Body mass index (BMI) 25.0-25.9, adult: Secondary | ICD-10-CM | POA: Diagnosis not present

## 2022-12-01 DIAGNOSIS — I10 Essential (primary) hypertension: Secondary | ICD-10-CM | POA: Diagnosis not present

## 2022-12-01 DIAGNOSIS — M5412 Radiculopathy, cervical region: Secondary | ICD-10-CM | POA: Diagnosis not present

## 2022-12-06 DIAGNOSIS — M9902 Segmental and somatic dysfunction of thoracic region: Secondary | ICD-10-CM | POA: Diagnosis not present

## 2022-12-06 DIAGNOSIS — M47812 Spondylosis without myelopathy or radiculopathy, cervical region: Secondary | ICD-10-CM | POA: Diagnosis not present

## 2022-12-06 DIAGNOSIS — M47816 Spondylosis without myelopathy or radiculopathy, lumbar region: Secondary | ICD-10-CM | POA: Diagnosis not present

## 2022-12-06 DIAGNOSIS — M9901 Segmental and somatic dysfunction of cervical region: Secondary | ICD-10-CM | POA: Diagnosis not present

## 2022-12-06 DIAGNOSIS — S233XXA Sprain of ligaments of thoracic spine, initial encounter: Secondary | ICD-10-CM | POA: Diagnosis not present

## 2022-12-06 DIAGNOSIS — M9903 Segmental and somatic dysfunction of lumbar region: Secondary | ICD-10-CM | POA: Diagnosis not present

## 2022-12-12 DIAGNOSIS — M4802 Spinal stenosis, cervical region: Secondary | ICD-10-CM | POA: Diagnosis not present

## 2022-12-12 DIAGNOSIS — M5011 Cervical disc disorder with radiculopathy,  high cervical region: Secondary | ICD-10-CM | POA: Diagnosis not present

## 2022-12-12 DIAGNOSIS — M5010 Cervical disc disorder with radiculopathy, unspecified cervical region: Secondary | ICD-10-CM | POA: Diagnosis not present

## 2022-12-12 DIAGNOSIS — M4722 Other spondylosis with radiculopathy, cervical region: Secondary | ICD-10-CM | POA: Diagnosis not present

## 2022-12-12 DIAGNOSIS — R200 Anesthesia of skin: Secondary | ICD-10-CM | POA: Diagnosis not present

## 2022-12-13 DIAGNOSIS — M9901 Segmental and somatic dysfunction of cervical region: Secondary | ICD-10-CM | POA: Diagnosis not present

## 2022-12-13 DIAGNOSIS — S233XXA Sprain of ligaments of thoracic spine, initial encounter: Secondary | ICD-10-CM | POA: Diagnosis not present

## 2022-12-13 DIAGNOSIS — M9902 Segmental and somatic dysfunction of thoracic region: Secondary | ICD-10-CM | POA: Diagnosis not present

## 2022-12-13 DIAGNOSIS — M9903 Segmental and somatic dysfunction of lumbar region: Secondary | ICD-10-CM | POA: Diagnosis not present

## 2022-12-13 DIAGNOSIS — M47812 Spondylosis without myelopathy or radiculopathy, cervical region: Secondary | ICD-10-CM | POA: Diagnosis not present

## 2022-12-13 DIAGNOSIS — M47816 Spondylosis without myelopathy or radiculopathy, lumbar region: Secondary | ICD-10-CM | POA: Diagnosis not present

## 2022-12-20 DIAGNOSIS — M47812 Spondylosis without myelopathy or radiculopathy, cervical region: Secondary | ICD-10-CM | POA: Diagnosis not present

## 2022-12-20 DIAGNOSIS — M9901 Segmental and somatic dysfunction of cervical region: Secondary | ICD-10-CM | POA: Diagnosis not present

## 2022-12-20 DIAGNOSIS — S233XXA Sprain of ligaments of thoracic spine, initial encounter: Secondary | ICD-10-CM | POA: Diagnosis not present

## 2022-12-20 DIAGNOSIS — M9902 Segmental and somatic dysfunction of thoracic region: Secondary | ICD-10-CM | POA: Diagnosis not present

## 2022-12-20 DIAGNOSIS — M9903 Segmental and somatic dysfunction of lumbar region: Secondary | ICD-10-CM | POA: Diagnosis not present

## 2022-12-20 DIAGNOSIS — M47816 Spondylosis without myelopathy or radiculopathy, lumbar region: Secondary | ICD-10-CM | POA: Diagnosis not present

## 2022-12-27 DIAGNOSIS — M47816 Spondylosis without myelopathy or radiculopathy, lumbar region: Secondary | ICD-10-CM | POA: Diagnosis not present

## 2022-12-27 DIAGNOSIS — M47812 Spondylosis without myelopathy or radiculopathy, cervical region: Secondary | ICD-10-CM | POA: Diagnosis not present

## 2022-12-27 DIAGNOSIS — M9902 Segmental and somatic dysfunction of thoracic region: Secondary | ICD-10-CM | POA: Diagnosis not present

## 2022-12-27 DIAGNOSIS — M9901 Segmental and somatic dysfunction of cervical region: Secondary | ICD-10-CM | POA: Diagnosis not present

## 2022-12-27 DIAGNOSIS — S233XXA Sprain of ligaments of thoracic spine, initial encounter: Secondary | ICD-10-CM | POA: Diagnosis not present

## 2022-12-27 DIAGNOSIS — M9903 Segmental and somatic dysfunction of lumbar region: Secondary | ICD-10-CM | POA: Diagnosis not present

## 2023-01-03 DIAGNOSIS — M9901 Segmental and somatic dysfunction of cervical region: Secondary | ICD-10-CM | POA: Diagnosis not present

## 2023-01-03 DIAGNOSIS — M9903 Segmental and somatic dysfunction of lumbar region: Secondary | ICD-10-CM | POA: Diagnosis not present

## 2023-01-03 DIAGNOSIS — M9902 Segmental and somatic dysfunction of thoracic region: Secondary | ICD-10-CM | POA: Diagnosis not present

## 2023-01-03 DIAGNOSIS — M47816 Spondylosis without myelopathy or radiculopathy, lumbar region: Secondary | ICD-10-CM | POA: Diagnosis not present

## 2023-01-03 DIAGNOSIS — S233XXA Sprain of ligaments of thoracic spine, initial encounter: Secondary | ICD-10-CM | POA: Diagnosis not present

## 2023-01-03 DIAGNOSIS — M47812 Spondylosis without myelopathy or radiculopathy, cervical region: Secondary | ICD-10-CM | POA: Diagnosis not present

## 2023-01-30 DIAGNOSIS — G5602 Carpal tunnel syndrome, left upper limb: Secondary | ICD-10-CM | POA: Diagnosis not present

## 2023-01-30 DIAGNOSIS — G5621 Lesion of ulnar nerve, right upper limb: Secondary | ICD-10-CM | POA: Diagnosis not present

## 2023-01-30 DIAGNOSIS — G5601 Carpal tunnel syndrome, right upper limb: Secondary | ICD-10-CM | POA: Diagnosis not present

## 2023-01-30 DIAGNOSIS — G5622 Lesion of ulnar nerve, left upper limb: Secondary | ICD-10-CM | POA: Diagnosis not present

## 2023-01-31 DIAGNOSIS — M9902 Segmental and somatic dysfunction of thoracic region: Secondary | ICD-10-CM | POA: Diagnosis not present

## 2023-01-31 DIAGNOSIS — M9903 Segmental and somatic dysfunction of lumbar region: Secondary | ICD-10-CM | POA: Diagnosis not present

## 2023-01-31 DIAGNOSIS — M47816 Spondylosis without myelopathy or radiculopathy, lumbar region: Secondary | ICD-10-CM | POA: Diagnosis not present

## 2023-01-31 DIAGNOSIS — S233XXA Sprain of ligaments of thoracic spine, initial encounter: Secondary | ICD-10-CM | POA: Diagnosis not present

## 2023-01-31 DIAGNOSIS — M9901 Segmental and somatic dysfunction of cervical region: Secondary | ICD-10-CM | POA: Diagnosis not present

## 2023-01-31 DIAGNOSIS — M47812 Spondylosis without myelopathy or radiculopathy, cervical region: Secondary | ICD-10-CM | POA: Diagnosis not present

## 2023-02-07 DIAGNOSIS — M9901 Segmental and somatic dysfunction of cervical region: Secondary | ICD-10-CM | POA: Diagnosis not present

## 2023-02-07 DIAGNOSIS — M47812 Spondylosis without myelopathy or radiculopathy, cervical region: Secondary | ICD-10-CM | POA: Diagnosis not present

## 2023-02-07 DIAGNOSIS — M47816 Spondylosis without myelopathy or radiculopathy, lumbar region: Secondary | ICD-10-CM | POA: Diagnosis not present

## 2023-02-07 DIAGNOSIS — M9903 Segmental and somatic dysfunction of lumbar region: Secondary | ICD-10-CM | POA: Diagnosis not present

## 2023-02-07 DIAGNOSIS — S233XXA Sprain of ligaments of thoracic spine, initial encounter: Secondary | ICD-10-CM | POA: Diagnosis not present

## 2023-02-07 DIAGNOSIS — M9902 Segmental and somatic dysfunction of thoracic region: Secondary | ICD-10-CM | POA: Diagnosis not present

## 2023-02-14 DIAGNOSIS — M9903 Segmental and somatic dysfunction of lumbar region: Secondary | ICD-10-CM | POA: Diagnosis not present

## 2023-02-14 DIAGNOSIS — M47816 Spondylosis without myelopathy or radiculopathy, lumbar region: Secondary | ICD-10-CM | POA: Diagnosis not present

## 2023-02-14 DIAGNOSIS — M9902 Segmental and somatic dysfunction of thoracic region: Secondary | ICD-10-CM | POA: Diagnosis not present

## 2023-02-14 DIAGNOSIS — S233XXA Sprain of ligaments of thoracic spine, initial encounter: Secondary | ICD-10-CM | POA: Diagnosis not present

## 2023-02-14 DIAGNOSIS — M47812 Spondylosis without myelopathy or radiculopathy, cervical region: Secondary | ICD-10-CM | POA: Diagnosis not present

## 2023-02-14 DIAGNOSIS — M9901 Segmental and somatic dysfunction of cervical region: Secondary | ICD-10-CM | POA: Diagnosis not present

## 2023-02-21 DIAGNOSIS — E1142 Type 2 diabetes mellitus with diabetic polyneuropathy: Secondary | ICD-10-CM | POA: Diagnosis not present

## 2023-03-14 DIAGNOSIS — H40013 Open angle with borderline findings, low risk, bilateral: Secondary | ICD-10-CM | POA: Diagnosis not present

## 2023-03-30 DIAGNOSIS — E1165 Type 2 diabetes mellitus with hyperglycemia: Secondary | ICD-10-CM | POA: Diagnosis not present

## 2023-03-30 DIAGNOSIS — E7800 Pure hypercholesterolemia, unspecified: Secondary | ICD-10-CM | POA: Diagnosis not present

## 2023-03-30 DIAGNOSIS — I10 Essential (primary) hypertension: Secondary | ICD-10-CM | POA: Diagnosis not present

## 2023-03-30 DIAGNOSIS — Z13220 Encounter for screening for lipoid disorders: Secondary | ICD-10-CM | POA: Diagnosis not present

## 2023-04-03 DIAGNOSIS — G5603 Carpal tunnel syndrome, bilateral upper limbs: Secondary | ICD-10-CM | POA: Diagnosis not present

## 2023-04-03 DIAGNOSIS — G5621 Lesion of ulnar nerve, right upper limb: Secondary | ICD-10-CM | POA: Diagnosis not present

## 2023-04-03 DIAGNOSIS — G5622 Lesion of ulnar nerve, left upper limb: Secondary | ICD-10-CM | POA: Diagnosis not present

## 2023-04-05 DIAGNOSIS — Z6824 Body mass index (BMI) 24.0-24.9, adult: Secondary | ICD-10-CM | POA: Diagnosis not present

## 2023-04-05 DIAGNOSIS — E1169 Type 2 diabetes mellitus with other specified complication: Secondary | ICD-10-CM | POA: Diagnosis not present

## 2023-04-05 DIAGNOSIS — I700 Atherosclerosis of aorta: Secondary | ICD-10-CM | POA: Diagnosis not present

## 2023-04-05 DIAGNOSIS — I10 Essential (primary) hypertension: Secondary | ICD-10-CM | POA: Diagnosis not present

## 2023-04-05 DIAGNOSIS — M5412 Radiculopathy, cervical region: Secondary | ICD-10-CM | POA: Diagnosis not present

## 2023-04-13 DIAGNOSIS — G5601 Carpal tunnel syndrome, right upper limb: Secondary | ICD-10-CM | POA: Diagnosis not present

## 2023-04-13 DIAGNOSIS — G5602 Carpal tunnel syndrome, left upper limb: Secondary | ICD-10-CM | POA: Diagnosis not present

## 2023-04-27 ENCOUNTER — Other Ambulatory Visit: Payer: Self-pay | Admitting: Neurosurgery

## 2023-05-01 NOTE — Pre-Procedure Instructions (Signed)
Surgical Instructions   Your procedure is scheduled on May 05, 2023. Report to [facility] Main Entrance "A" at 11:45 A.M., then check in with the Admitting office. Any questions or running late day of surgery: call [phone]  Questions prior to your surgery date: call [phone], Monday-Friday, 8am-4pm. If you experience any cold or flu symptoms such as cough, fever, chills, shortness of breath, etc. between now and your scheduled surgery, please notify us at the above number.     Remember:  Do not eat or drink after midnight the night before your surgery   Take these medicines the morning of surgery with A SIP OF WATER: dutasteride (AVODART)  gabapentin (NEURONTIN)  rosuvastatin (CRESTOR)  tamsulosin (FLOMAX)    One week prior to surgery, STOP taking any Aspirin (unless otherwise instructed by your surgeon) Aleve, Naproxen, Ibuprofen, Motrin, Advil, Goody's, BC's, all herbal medications, fish oil, and non-prescription vitamins.                     Do NOT Smoke (Tobacco/Vaping) for 24 hours prior to your procedure.  If you use a CPAP at night, you may bring your mask/headgear for your overnight stay.   You will be asked to remove any contacts, glasses, piercing's, hearing aid's, dentures/partials prior to surgery. Please bring cases for these items if needed.    Patients discharged the day of surgery will not be allowed to drive home, and someone needs to stay with them for 24 hours.  SURGICAL WAITING ROOM VISITATION Patients may have no more than 2 support people in the waiting area - these visitors may rotate.   Pre-op nurse will coordinate an appropriate time for 1 ADULT support person, who may not rotate, to accompany patient in pre-op.  Children under the age of 76 must have an adult with them who is not the patient and must remain in the main waiting area with an adult.  If the patient needs to stay at the hospital during part of their recovery, the visitor  guidelines for inpatient rooms apply.  Please refer to the [facility] website for the visitor guidelines for any additional information.   If you received a COVID test during your pre-op visit  it is requested that you wear a mask when out in public, stay away from anyone that may not be feeling well and notify your surgeon if you develop symptoms. If you have been in contact with anyone that has tested positive in the last 10 days please notify you surgeon.      Pre-operative CHG Bathing Instructions   You can play a key role in reducing the risk of infection after surgery. Your skin needs to be as free of germs as possible. You can reduce the number of germs on your skin by washing with CHG (chlorhexidine gluconate) soap before surgery. CHG is an antiseptic soap that kills germs and continues to kill germs even after washing.   DO NOT use if you have an allergy to chlorhexidine/CHG or antibacterial soaps. If your skin becomes reddened or irritated, stop using the CHG and notify one of our RNs at [phone].              TAKE A SHOWER THE NIGHT BEFORE SURGERY AND THE DAY OF SURGERY    Please keep in mind the following:  DO NOT shave, including legs and underarms, 48 hours prior to surgery.   You may shave your face before/day of surgery.  Place clean sheets  on your bed the night before surgery Use a clean washcloth (not used since being washed) for each shower. DO NOT sleep with pet's night before surgery.  CHG Shower Instructions:  Wash your face and private area with normal soap. If you choose to wash your hair, wash first with your normal shampoo.  After you use shampoo/soap, rinse your hair and body thoroughly to remove shampoo/soap residue.  Turn the water OFF and apply half the bottle of CHG soap to a CLEAN washcloth.  Apply CHG soap ONLY FROM YOUR NECK DOWN TO YOUR TOES (washing for 3-5 minutes)  DO NOT use CHG soap on face, private areas, open wounds, or sores.  Pay special  attention to the area where your surgery is being performed.  If you are having back surgery, having someone wash your back for you may be helpful. Wait 2 minutes after CHG soap is applied, then you may rinse off the CHG soap.  Pat dry with a clean towel  Put on clean pajamas    Additional instructions for the day of surgery: DO NOT APPLY any lotions, deodorants, cologne, or perfumes.   Do not wear jewelry or makeup Do not wear nail polish, gel polish, artificial nails, or any other type of covering on natural nails (fingers and toes) Do not bring valuables to the hospital. [facility] is not responsible for valuables/personal belongings. Put on clean/comfortable clothes.  Please brush your teeth.  Ask your nurse before applying any prescription medications to the skin.

## 2023-05-02 ENCOUNTER — Other Ambulatory Visit: Payer: Self-pay

## 2023-05-02 ENCOUNTER — Encounter (HOSPITAL_COMMUNITY)
Admission: RE | Admit: 2023-05-02 | Discharge: 2023-05-02 | Disposition: A | Discharge status: Home / Self Care | Payer: Medicare Other | Source: Ambulatory Visit | Attending: Neurosurgery

## 2023-05-02 ENCOUNTER — Encounter (HOSPITAL_COMMUNITY): Payer: Self-pay

## 2023-05-02 VITALS — BP 155/77 | HR 78 | Temp 97.5°F | Resp 17 | Ht 69.0 in | Wt 163.0 lb

## 2023-05-02 DIAGNOSIS — Z01812 Encounter for preprocedural laboratory examination: Secondary | ICD-10-CM | POA: Diagnosis present

## 2023-05-02 DIAGNOSIS — I4510 Unspecified right bundle-branch block: Secondary | ICD-10-CM | POA: Insufficient documentation

## 2023-05-02 DIAGNOSIS — Z01818 Encounter for other preprocedural examination: Secondary | ICD-10-CM | POA: Insufficient documentation

## 2023-05-02 DIAGNOSIS — Z01810 Encounter for preprocedural cardiovascular examination: Secondary | ICD-10-CM | POA: Diagnosis present

## 2023-05-02 DIAGNOSIS — E119 Type 2 diabetes mellitus without complications: Secondary | ICD-10-CM | POA: Diagnosis not present

## 2023-05-02 LAB — CBC
HCT: 35.5 % — ABNORMAL LOW (ref 39.0–52.0)
Hemoglobin: 11.9 g/dL — ABNORMAL LOW (ref 13.0–17.0)
MCH: 29.5 pg (ref 26.0–34.0)
MCHC: 33.5 g/dL (ref 30.0–36.0)
MCV: 87.9 fL (ref 80.0–100.0)
Platelets: 147 10*3/uL — ABNORMAL LOW (ref 150–400)
RBC: 4.04 MIL/uL — ABNORMAL LOW (ref 4.22–5.81)
RDW: 13.2 % (ref 11.5–15.5)
WBC: 6.1 10*3/uL (ref 4.0–10.5)
nRBC: 0 % (ref 0.0–0.2)

## 2023-05-02 LAB — BASIC METABOLIC PANEL
Anion gap: 9 (ref 5–15)
BUN: 16 mg/dL (ref 8–23)
CO2: 25 mmol/L (ref 22–32)
Calcium: 9.7 mg/dL (ref 8.9–10.3)
Chloride: 106 mmol/L (ref 98–111)
Creatinine, Ser: 1.07 mg/dL (ref 0.61–1.24)
GFR, Estimated: >60 mL/min (ref >60)
Glucose, Bld: 94 mg/dL (ref 70–99)
Potassium: 4.2 mmol/L (ref 3.5–5.1)
Sodium: 140 mmol/L (ref 135–145)

## 2023-05-02 LAB — GLUCOSE, CAPILLARY: Glucose-Capillary: 92 mg/dL (ref 70–99)

## 2023-05-02 NOTE — Progress Notes (Signed)
PCP - Dr. Quintin Alto Cardiologist - Denies Pulmonologist - Dr. Audie Box - Last office visit 10/26/2021. Has not been set up with new MD since Icard left practice. TCTS - Dr. Brynda Greathouse - Last office visit 05/28/2021. Following for an upper left lobe nodule.  PPM/ICD - Denies Device Orders - n/a Rep Notified - n/a  Chest x-ray - n/a EKG - 05/02/2023 Stress Test - 04/15/2021 ECHO - Denies Cardiac Cath - Denies  Sleep Study - Denies CPAP - n/a  Pt is DM2. He checks his blood sugar every other day. Normal fasting is 90-110s. CBG at pre-op 92. A1c result requested from PCP, but pt thinks result was 5.8  Last dose of GLP1 agonist-  n/a GLP1 instructions: n/a  Blood Thinner Instructions: n/a Aspirin Instructions: n/a  NPO after midnight  COVID TEST- n/a   Anesthesia review: Yes. EKG review and pending A1c result from PCP   Patient denies shortness of breath, fever, cough and chest pain at PAT appointment. Pt denies any respiratory illness/infection in the last two months.   All instructions explained to the patient, with a verbal understanding of the material. Patient agrees to go over the instructions while at home for a better understanding. Patient also instructed to self quarantine after being tested for COVID-19. The opportunity to ask questions was provided.

## 2023-05-05 ENCOUNTER — Ambulatory Visit (HOSPITAL_COMMUNITY)
Admission: RE | Admit: 2023-05-05 | Discharge: 2023-05-05 | Disposition: A | Discharge status: Home / Self Care | Payer: Medicare Other | Attending: Neurosurgery | Admitting: Neurosurgery

## 2023-05-05 ENCOUNTER — Encounter (HOSPITAL_COMMUNITY)
Admission: RE | Disposition: A | Discharge status: Home / Self Care | Payer: Self-pay | Source: Home / Self Care | Attending: Neurosurgery

## 2023-05-05 ENCOUNTER — Other Ambulatory Visit: Payer: Self-pay

## 2023-05-05 ENCOUNTER — Ambulatory Visit (HOSPITAL_COMMUNITY): Payer: Medicare Other | Admitting: Anesthesiology

## 2023-05-05 ENCOUNTER — Encounter (HOSPITAL_COMMUNITY): Payer: Self-pay | Admitting: Neurosurgery

## 2023-05-05 ENCOUNTER — Ambulatory Visit (HOSPITAL_COMMUNITY): Payer: Self-pay | Admitting: Physician Assistant

## 2023-05-05 DIAGNOSIS — G5601 Carpal tunnel syndrome, right upper limb: Secondary | ICD-10-CM

## 2023-05-05 DIAGNOSIS — Z79899 Other long term (current) drug therapy: Secondary | ICD-10-CM | POA: Insufficient documentation

## 2023-05-05 DIAGNOSIS — I10 Essential (primary) hypertension: Secondary | ICD-10-CM

## 2023-05-05 DIAGNOSIS — Z87891 Personal history of nicotine dependence: Secondary | ICD-10-CM

## 2023-05-05 DIAGNOSIS — G5603 Carpal tunnel syndrome, bilateral upper limbs: Secondary | ICD-10-CM | POA: Insufficient documentation

## 2023-05-05 DIAGNOSIS — Z86718 Personal history of other venous thrombosis and embolism: Secondary | ICD-10-CM | POA: Insufficient documentation

## 2023-05-05 DIAGNOSIS — E119 Type 2 diabetes mellitus without complications: Secondary | ICD-10-CM | POA: Diagnosis not present

## 2023-05-05 DIAGNOSIS — M1990 Unspecified osteoarthritis, unspecified site: Secondary | ICD-10-CM | POA: Insufficient documentation

## 2023-05-05 HISTORY — PX: BILATERAL CARPAL TUNNEL RELEASE: SHX6508

## 2023-05-05 LAB — GLUCOSE, CAPILLARY
Glucose-Capillary: 79 mg/dL (ref 70–99)
Glucose-Capillary: 82 mg/dL (ref 70–99)
Glucose-Capillary: 123 mg/dL — ABNORMAL HIGH (ref 70–99)

## 2023-05-05 SURGERY — BILATERAL CARPAL TUNNEL RELEASE
Anesthesia: Monitor Anesthesia Care | Site: Hand | Laterality: Right

## 2023-05-05 MED ORDER — CHLORHEXIDINE GLUCONATE 0.12 % MT SOLN
OROMUCOSAL | Status: AC
Start: 2023-05-05 — End: 2023-05-05
  Administered 2023-05-05: 15 mL via OROMUCOSAL
  Filled 2023-05-05: qty 15

## 2023-05-05 MED ORDER — BACITRACIN ZINC 500 UNIT/GM EX OINT
TOPICAL_OINTMENT | CUTANEOUS | Status: AC
  Filled 2023-05-05: qty 28.35

## 2023-05-05 MED ORDER — CEFAZOLIN SODIUM-DEXTROSE 2-4 GM/100ML-% IV SOLN
INTRAVENOUS | Status: AC
Start: 2023-05-05 — End: 2023-05-05
  Filled 2023-05-05: qty 100

## 2023-05-05 MED ORDER — PROPOFOL 500 MG/50ML IV EMUL
INTRAVENOUS | Status: DC | PRN
  Administered 2023-05-05: 150 ug/kg/min via INTRAVENOUS

## 2023-05-05 MED ORDER — ACETAMINOPHEN 10 MG/ML IV SOLN: 1000.0000 mg | Freq: Once | INTRAVENOUS | Status: DC | PRN

## 2023-05-05 MED ORDER — LACTATED RINGERS IV SOLN: INTRAVENOUS | Status: DC

## 2023-05-05 MED ORDER — INSULIN ASPART 100 UNIT/ML IJ SOLN: 0.0000 [IU] | INTRAMUSCULAR | Status: DC | PRN

## 2023-05-05 MED ORDER — DEXAMETHASONE SODIUM PHOSPHATE 10 MG/ML IJ SOLN
INTRAMUSCULAR | Status: DC | PRN
  Administered 2023-05-05: 4 mg via INTRAVENOUS

## 2023-05-05 MED ORDER — CHLORHEXIDINE GLUCONATE 0.12 % MT SOLN: 15.0000 mL | Freq: Once | OROMUCOSAL | Status: AC

## 2023-05-05 MED ORDER — CHLORHEXIDINE GLUCONATE CLOTH 2 % EX PADS: 6.0000 | MEDICATED_PAD | Freq: Once | CUTANEOUS | Status: DC

## 2023-05-05 MED ORDER — LIDOCAINE-EPINEPHRINE 0.5 %-1:200000 IJ SOLN
INTRAMUSCULAR | Status: AC
  Filled 2023-05-05: qty 50

## 2023-05-05 MED ORDER — PHENYLEPHRINE HCL-NACL 20-0.9 MG/250ML-% IV SOLN
INTRAVENOUS | Status: DC | PRN
  Administered 2023-05-05: 50 ug/min via INTRAVENOUS

## 2023-05-05 MED ORDER — ONDANSETRON HCL 4 MG/2ML IJ SOLN
INTRAMUSCULAR | Status: DC | PRN
  Administered 2023-05-05: 4 mg via INTRAVENOUS

## 2023-05-05 MED ORDER — ACETAMINOPHEN-CODEINE 300-30 MG PO TABS: 1.0000 | ORAL_TABLET | Freq: Four times a day (QID) | ORAL | 0 refills | Status: AC | PRN

## 2023-05-05 MED ORDER — 0.9 % SODIUM CHLORIDE (POUR BTL) OPTIME
TOPICAL | Status: DC | PRN
  Administered 2023-05-05: 1000 mL

## 2023-05-05 MED ORDER — LIDOCAINE-EPINEPHRINE 0.5 %-1:200000 IJ SOLN
INTRAMUSCULAR | Status: DC | PRN
Start: 2023-05-05 — End: 2023-05-05
  Administered 2023-05-05: 5 mL

## 2023-05-05 MED ORDER — ORAL CARE MOUTH RINSE: 15.0000 mL | Freq: Once | OROMUCOSAL | Status: AC

## 2023-05-05 MED ORDER — CEFAZOLIN SODIUM-DEXTROSE 2-4 GM/100ML-% IV SOLN
2.0000 g | INTRAVENOUS | Status: AC
  Administered 2023-05-05: 2 g via INTRAVENOUS

## 2023-05-05 MED ORDER — FENTANYL CITRATE (PF) 100 MCG/2ML IJ SOLN: 25.0000 ug | INTRAMUSCULAR | Status: DC | PRN

## 2023-05-05 MED ORDER — EPHEDRINE SULFATE-NACL 50-0.9 MG/10ML-% IV SOSY
PREFILLED_SYRINGE | INTRAVENOUS | Status: DC | PRN
  Administered 2023-05-05 (×4): 5 mg via INTRAVENOUS

## 2023-05-05 SURGICAL SUPPLY — 33 items
BAG COUNTER SPONGE SURGICOUNT (BAG) ×1 IMPLANT
BLADE SURG 15 STRL LF DISP TIS (BLADE) ×1 IMPLANT
BNDG ELASTIC 3INX 5YD STR LF (GAUZE/BANDAGES/DRESSINGS) ×1 IMPLANT
BNDG ELASTIC 4X5.8 VLCR NS LF (GAUZE/BANDAGES/DRESSINGS) IMPLANT
BNDG GAUZE DERMACEA FLUFF 4 (GAUZE/BANDAGES/DRESSINGS) ×1 IMPLANT
CABLE BIPOLOR RESECTION CORD (MISCELLANEOUS) ×1 IMPLANT
DRAPE EXTREMITY T 121X128X90 (DISPOSABLE) ×1 IMPLANT
DRAPE HALF SHEET 40X57 (DRAPES) ×1 IMPLANT
DRSG TELFA 3X8 NADH STRL (GAUZE/BANDAGES/DRESSINGS) IMPLANT
DURAPREP 26ML APPLICATOR (WOUND CARE) ×1 IMPLANT
GAUZE 4X4 16PLY ~~LOC~~+RFID DBL (SPONGE) ×1 IMPLANT
GAUZE SPONGE 4X4 12PLY STRL (GAUZE/BANDAGES/DRESSINGS) IMPLANT
GLOVE ECLIPSE 6.5 STRL STRAW (GLOVE) ×1 IMPLANT
GOWN STRL REUS W/ TWL LRG LVL3 (GOWN DISPOSABLE) ×2 IMPLANT
GOWN STRL REUS W/ TWL XL LVL3 (GOWN DISPOSABLE) IMPLANT
GOWN STRL REUS W/TWL 2XL LVL3 (GOWN DISPOSABLE) IMPLANT
KIT BASIN OR (CUSTOM PROCEDURE TRAY) ×1 IMPLANT
KIT TURNOVER KIT B (KITS) ×1 IMPLANT
NDL HYPO 25X1 1.5 SAFETY (NEEDLE) ×1 IMPLANT
NEEDLE HYPO 25X1 1.5 SAFETY (NEEDLE) ×1
NS IRRIG 1000ML POUR BTL (IV SOLUTION) ×1 IMPLANT
PACK SRG BSC III STRL LF ECLPS (CUSTOM PROCEDURE TRAY) ×1 IMPLANT
PAD ARMBOARD 7.5X6 YLW CONV (MISCELLANEOUS) ×3 IMPLANT
SPIKE FLUID TRANSFER (MISCELLANEOUS) ×1 IMPLANT
STOCKINETTE 4X48 STRL (DRAPES) ×1 IMPLANT
SUT ETHILON 3 0 PS 1 (SUTURE) ×1 IMPLANT
SYR BULB EAR ULCER 3OZ GRN STR (SYRINGE) ×1 IMPLANT
SYR CONTROL 10ML LL (SYRINGE) ×1 IMPLANT
TOWEL GREEN STERILE (TOWEL DISPOSABLE) ×1 IMPLANT
TOWEL GREEN STERILE FF (TOWEL DISPOSABLE) ×1 IMPLANT
TUBE CONNECTING 12X1/4 (SUCTIONS) ×1 IMPLANT
UNDERPAD 30X36 HEAVY ABSORB (UNDERPADS AND DIAPERS) ×1 IMPLANT
WATER STERILE IRR 1000ML POUR (IV SOLUTION) ×1 IMPLANT

## 2023-05-05 NOTE — H&P (Signed)
BP (!) 151/70   Pulse 73   Temp 97.8 F (36.6 C) (Oral)   Resp 18   Ht 5\' 9"  (1.753 m)   Wt 73.9 kg   SpO2 100%   BMI 24.07 kg/m  Eric [name] is sent to me today for hand pain and numbness in his right hand.     On physical exam, he had a markedly positive Tinel sign at the cubital tunnel on the right, at the transverse carpal ligament on the right, at the transverse carpal ligament on the left and a mildly positive Tinel sign over the cubital tunnel on the left.  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  He denied any significant neck pain and I am not going to waste his time by looking there.  Even if he were to have some cervical spondylitic change, that is not going to in any way ameliorate the changes at the carpal tunnel or the cubital tunnel and I would rather work on his periphery at age 78 than doing any sort of cervical spine surgery. He returns with an EMG and nerve conduction study.  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  I, again, went over the cubital tunnel and he had minimal, if any, Tinel sign.  He really is interested in the carpal tunnel.  There is no rule that we have to do both at the same time, and the carpal tunnel is what really is bothering him, so we will go ahead and get him set up for a carpal tunnel release. Allergies  Allergen Reactions   Metformin And Related Other (See Comments)    Intolerance- "caused bladder cancer"   Family History  Problem Relation Age of Onset   Hypercholesterolemia Father    Hypercholesterolemia Mother    Colon cancer Neg Hx    Urolithiasis Neg Hx    Sudden death Neg Hx    Lupus Neg Hx    Sickle cell trait Neg Hx    Social History   Socioeconomic History   Marital status: Married    Spouse name: Not on file   Number of children: Not on file    Years of education: Not on file   Highest education level: Not on file  Occupational History   Not on file  Tobacco Use   Smoking status: Former    Current packs/day: 0.00    Average packs/day: 0.3 packs/day for 1 year (0.3 ttl pk-yrs)    Types: Cigarettes    Start date: 03/15/1957    Quit date: 03/15/1958    Years since quitting: 65.1    Passive exposure: Never (smoke a little in high schools)   Smokeless tobacco: Former    Types: Chew    Quit date: 1999  Vaping Use   Vaping status: Never Used  Substance and Sexual Activity   Alcohol use: Not Currently   Drug use: Never   Sexual activity: Not Currently  Other Topics Concern   Not on file  Social History Narrative   Not on file   Social Drivers of Health   Financial Resource Strain: Not on file  Food Insecurity: Not on file  Transportation Needs: Not on file  Physical Activity: Not on file  Stress: Not on file  Social Connections: Not on file  Intimate Partner Violence:  Not on file   Prior to Admission medications   Medication Sig Start Date End Date Taking? Authorizing Provider  Ascorbic Acid (VITAMIN C) 1000 MG tablet Take 1,000 mg by mouth daily.   Yes [provider]  B Complex-C (B-COMPLEX WITH VITAMIN C) tablet Take 1 tablet by mouth daily.   Yes [provider]  Cholecalciferol (VITAMIN D3) 50 MCG (2000 UT) TABS Take 2,000 Units by mouth daily.   Yes [provider]  Coenzyme Q10 (COQ10) 200 MG CAPS Take 200 mg by mouth daily.   Yes [provider]  dutasteride (AVODART) 0.5 MG capsule take 1 capsule daily 09/20/22  Yes McKenzie, Mardene Celeste, MD  Flaxseed, Linseed, (FLAXSEED OIL) 1000 MG CAPS Take 1,000 mg by mouth daily.   Yes [provider]  gabapentin (NEURONTIN) 400 MG capsule Take 400 mg by mouth 3 (three) times daily. 05/09/19  Yes [provider]  lisinopril (ZESTRIL) 10 MG tablet Take 10 mg by mouth daily.   Yes [provider]  Multiple Vitamin  (MULTIVITAMIN WITH MINERALS) TABS tablet Take 1 tablet by mouth daily.   Yes [provider]  OVER THE COUNTER MEDICATION Take 1 capsule by mouth daily. Cellwise   Yes [provider]  OVER THE COUNTER MEDICATION Take 1 capsule by mouth daily. Blueberry   Yes [provider]  OVER THE COUNTER MEDICATION Take 1 tablet by mouth daily. Replenex Extra Strength   Yes [provider]  psyllium (METAMUCIL) 58.6 % powder Take 1 packet by mouth daily.   Yes [provider]  rosuvastatin (CRESTOR) 10 MG tablet Take 10 mg by mouth daily.   Yes [provider]  tamsulosin (FLOMAX) 0.4 MG CAPS capsule Take 1 capsule (0.4 mg total) by mouth 2 (two) times daily. 09/11/22  Yes McKenzie, Mardene Celeste, MD  Vitamin D-Vitamin K (VITAMIN K2-VITAMIN D3 PO) Take 1 tablet by mouth daily.   Yes [provider]  zolpidem (AMBIEN) 5 MG tablet Take 5 mg by mouth at bedtime as needed for sleep.   Yes [provider]  OVER THE COUNTER MEDICATION Take 1 tablet by mouth daily. Acid-A-Cal    [provider]  Vibegron (GEMTESA) 75 MG TABS Take 1 tablet (75 mg total) by mouth daily. Patient not taking: Reported on 04/28/2023 09/07/22   Malen Gauze, MD  Zinc 50 MG TABS Take 50 mg by mouth daily.    [provider]   Family History  Problem Relation Age of Onset   Hypercholesterolemia Father    Hypercholesterolemia Mother    Colon cancer Neg Hx    Urolithiasis Neg Hx    Sudden death Neg Hx    Lupus Neg Hx    Sickle cell trait Neg Hx    Social History   Socioeconomic History   Marital status: Married    Spouse name: Not on file   Number of children: Not on file   Years of education: Not on file   Highest education level: Not on file  Occupational History   Not on file  Tobacco Use   Smoking status: Former    Current packs/day: 0.00    Average packs/day: 0.3 packs/day for 1 year (0.3 ttl pk-yrs)    Types: Cigarettes    Start  date: 03/15/1957    Quit date: 03/15/1958    Years since quitting: 65.1    Passive exposure: Never (smoke a little in high schools)   Smokeless tobacco: Former  Types: Dorna Bloom    Quit date: 1999  Vaping Use   Vaping status: Never Used  Substance and Sexual Activity   Alcohol use: Not Currently   Drug use: Never   Sexual activity: Not Currently  Other Topics Concern   Not on file  Social History Narrative   Not on file   Social Drivers of Health   Financial Resource Strain: Not on file  Food Insecurity: Not on file  Transportation Needs: Not on file  Physical Activity: Not on file  Stress: Not on file  Social Connections: Not on file  Intimate Partner Violence: Not on file   OR for Carpal tunnel release.

## 2023-05-05 NOTE — Op Note (Signed)
   5:51 PM  PATIENT:  Eric [name]  78 y.o. male  PRE-OPERATIVE DIAGNOSIS:  right  Carpal Tunnel Syndrome  POST-OPERATIVE DIAGNOSIS:  right Carpal Tunnel Syndrome  PROCEDURE:  Procedure(s): Carpal tunnel release--right  SURGEON: Surgeon(s): Coletta Memos, MD  ANESTHESIA:   local and IV sedation  EBL:  Total I/O In: 700 [I.V.:600; IV Piggyback:100] Out: -   COUNT:per nursing  DICTATION: Saahas Hidrogo was taken to the operating room, given IV sedation, and positioned on the operating room table. male had their right upper extremity prepped and draped in a sterile manner. I infiltrated Licodcaine  1/2% 1/200,000 strength epinephrine into the planned incision starting at the proximal palmar crease extending into the hand ~ 1.5cm. I opened the skin with a 15 blade and extended the incision through the skin into the subcutaneous tissue. I used the bipolar cautery to control the subcutaneous bleeding. I dissected sharply through the tissue using forceps also to expose the transverse carpal ligament. I divided the transverse carpal ligament sharply with the 15 blade using the forceps to protect the contents of the carpal tunnel. With the scissors I divided the ligament both proximally and distally to decompress the entire carpal tunnel. I used the scissors to dissect into the forearm to create space to divide the ligament to the proximal palmar crease, and distally into the palm.  I irrigated the wound then closed the incision with vertical interrupted vertical mattress sutures. I placed a sterile dressing, then wrapped the proximal hand and distal forearm with an ace wrap.  PLAN OF CARE: Discharge to home after PACU  PATIENT DISPOSITION:  PACU - hemodynamically stable.   Delay start of Pharmacological VTE agent (>24hrs) due to surgical blood loss or risk of bleeding:  yes

## 2023-05-05 NOTE — Anesthesia Preprocedure Evaluation (Addendum)
Anesthesia Evaluation  Patient identified by MRN, date of birth, ID band Patient awake    Reviewed: Allergy & Precautions, NPO status , Patient's Chart, lab work & pertinent test results  History of Anesthesia Complications (+) PROLONGED EMERGENCE and history of anesthetic complications  Airway Mallampati: II  TM Distance: >3 FB Neck ROM: Full    Dental no notable dental hx.    Pulmonary former smoker   Pulmonary exam normal        Cardiovascular hypertension, Pt. on medications + DVT   Rhythm:Regular Rate:Normal     Neuro/Psych negative neurological ROS  negative psych ROS   GI/Hepatic negative GI ROS, Neg liver ROS,,,  Endo/Other  diabetes    Renal/GU negative Renal ROS  negative genitourinary   Musculoskeletal  (+) Arthritis , Osteoarthritis,  CTS   Abdominal Normal abdominal exam  (+)   Peds  Hematology   Anesthesia Other Findings   Reproductive/Obstetrics                             Anesthesia Physical Anesthesia Plan  ASA: 3  Anesthesia Plan: MAC   Post-op Pain Management:    Induction: Intravenous  PONV Risk Score and Plan: 1 and Ondansetron, Dexamethasone, Propofol infusion and Treatment may vary due to age or medical condition  Airway Management Planned: Simple Face Mask and Nasal Cannula  Additional Equipment: None  Intra-op Plan:   Post-operative Plan:   Informed Consent: I have reviewed the patients History and Physical, chart, labs and discussed the procedure including the risks, benefits and alternatives for the proposed anesthesia with the patient or authorized representative who has indicated his/her understanding and acceptance.     Dental advisory given  Plan Discussed with: CRNA  Anesthesia Plan Comments:        Anesthesia Quick Evaluation

## 2023-05-05 NOTE — Transfer of Care (Signed)
Immediate Anesthesia Transfer of Care Note  Patient: Eric [name]  Procedure(s) Performed: Carpal tunnel release--right (Right: Hand)  Patient Location: PACU  Anesthesia Type:MAC  Level of Consciousness: drowsy  Airway & Oxygen Therapy: Patient Spontanous Breathing and Patient connected to face mask oxygen  Post-op Assessment: Report given to RN and Post -op Vital signs reviewed and stable  Post vital signs: Reviewed and stable  Last Vitals:  Vitals Value Taken Time  BP 115/58 05/05/23 1730  Temp 36.6 C 05/05/23 1727  Pulse 81 05/05/23 1735  Resp 19 05/05/23 1735  SpO2 96 % 05/05/23 1735  Vitals shown include unfiled device data.  Last Pain:  Vitals:   05/05/23 1730  TempSrc:   PainSc: Asleep         Complications: No notable events documented.

## 2023-05-06 NOTE — Anesthesia Postprocedure Evaluation (Signed)
 Anesthesia Post Note  Patient: Eric [name]  Procedure(s) Performed: Carpal tunnel release--right (Right: Hand)     Patient location during evaluation: PACU Anesthesia Type: MAC Level of consciousness: awake Pain management: pain level controlled Vital Signs Assessment: post-procedure vital signs reviewed and stable Respiratory status: spontaneous breathing, nonlabored ventilation and respiratory function stable Cardiovascular status: blood pressure returned to baseline and stable Postop Assessment: no apparent nausea or vomiting Anesthetic complications: no   No notable events documented.  Last Vitals:  Vitals:   05/05/23 1745 05/05/23 1800  BP: (!) 146/73 (!) 140/70  Pulse: 90 88  Resp: 19 18  Temp:  36.5 C  SpO2: 95% 97%    Last Pain:  Vitals:   05/05/23 1800  TempSrc:   PainSc: 0-No pain                 Cyd Hostler P Bess Saltzman

## 2023-05-07 ENCOUNTER — Encounter (HOSPITAL_COMMUNITY): Payer: Self-pay | Admitting: Neurosurgery

## 2023-06-12 DIAGNOSIS — M47812 Spondylosis without myelopathy or radiculopathy, cervical region: Secondary | ICD-10-CM | POA: Diagnosis not present

## 2023-06-12 DIAGNOSIS — S233XXA Sprain of ligaments of thoracic spine, initial encounter: Secondary | ICD-10-CM | POA: Diagnosis not present

## 2023-06-12 DIAGNOSIS — M47816 Spondylosis without myelopathy or radiculopathy, lumbar region: Secondary | ICD-10-CM | POA: Diagnosis not present

## 2023-06-12 DIAGNOSIS — M9903 Segmental and somatic dysfunction of lumbar region: Secondary | ICD-10-CM | POA: Diagnosis not present

## 2023-06-12 DIAGNOSIS — M9901 Segmental and somatic dysfunction of cervical region: Secondary | ICD-10-CM | POA: Diagnosis not present

## 2023-06-12 DIAGNOSIS — M9902 Segmental and somatic dysfunction of thoracic region: Secondary | ICD-10-CM | POA: Diagnosis not present

## 2023-06-15 DIAGNOSIS — Z1329 Encounter for screening for other suspected endocrine disorder: Secondary | ICD-10-CM | POA: Diagnosis not present

## 2023-06-15 DIAGNOSIS — E1169 Type 2 diabetes mellitus with other specified complication: Secondary | ICD-10-CM | POA: Diagnosis not present

## 2023-06-15 DIAGNOSIS — E1165 Type 2 diabetes mellitus with hyperglycemia: Secondary | ICD-10-CM | POA: Diagnosis not present

## 2023-06-15 DIAGNOSIS — I10 Essential (primary) hypertension: Secondary | ICD-10-CM | POA: Diagnosis not present

## 2023-06-15 DIAGNOSIS — D696 Thrombocytopenia, unspecified: Secondary | ICD-10-CM | POA: Diagnosis not present

## 2023-06-22 DIAGNOSIS — G5790 Unspecified mononeuropathy of unspecified lower limb: Secondary | ICD-10-CM | POA: Diagnosis not present

## 2023-06-22 DIAGNOSIS — E1165 Type 2 diabetes mellitus with hyperglycemia: Secondary | ICD-10-CM | POA: Diagnosis not present

## 2023-06-22 DIAGNOSIS — R911 Solitary pulmonary nodule: Secondary | ICD-10-CM | POA: Diagnosis not present

## 2023-06-22 DIAGNOSIS — R7989 Other specified abnormal findings of blood chemistry: Secondary | ICD-10-CM | POA: Diagnosis not present

## 2023-06-22 DIAGNOSIS — Z6824 Body mass index (BMI) 24.0-24.9, adult: Secondary | ICD-10-CM | POA: Diagnosis not present

## 2023-08-22 DIAGNOSIS — M25572 Pain in left ankle and joints of left foot: Secondary | ICD-10-CM | POA: Diagnosis not present

## 2023-08-22 DIAGNOSIS — E1142 Type 2 diabetes mellitus with diabetic polyneuropathy: Secondary | ICD-10-CM | POA: Diagnosis not present

## 2023-08-22 DIAGNOSIS — M25672 Stiffness of left ankle, not elsewhere classified: Secondary | ICD-10-CM | POA: Diagnosis not present

## 2023-09-04 ENCOUNTER — Ambulatory Visit: Payer: Medicare Other | Admitting: Urology

## 2023-09-04 VITALS — BP 163/72 | HR 80

## 2023-09-04 DIAGNOSIS — R339 Retention of urine, unspecified: Secondary | ICD-10-CM

## 2023-09-04 DIAGNOSIS — C679 Malignant neoplasm of bladder, unspecified: Secondary | ICD-10-CM | POA: Diagnosis not present

## 2023-09-04 DIAGNOSIS — N401 Enlarged prostate with lower urinary tract symptoms: Secondary | ICD-10-CM | POA: Diagnosis not present

## 2023-09-04 DIAGNOSIS — R338 Other retention of urine: Secondary | ICD-10-CM

## 2023-09-04 DIAGNOSIS — N138 Other obstructive and reflux uropathy: Secondary | ICD-10-CM

## 2023-09-04 LAB — MICROSCOPIC EXAMINATION
Bacteria, UA: NONE SEEN
WBC, UA: NONE SEEN /HPF (ref 0–5)

## 2023-09-04 LAB — URINALYSIS, ROUTINE W REFLEX MICROSCOPIC
Bilirubin, UA: NEGATIVE
Glucose, UA: NEGATIVE
Ketones, UA: NEGATIVE
Leukocytes,UA: NEGATIVE
Nitrite, UA: NEGATIVE
Protein,UA: NEGATIVE
Specific Gravity, UA: 1.01 (ref 1.005–1.030)
Urobilinogen, Ur: 0.2 mg/dL (ref 0.2–1.0)
pH, UA: 5.5 (ref 5.0–7.5)

## 2023-09-04 MED ORDER — DUTASTERIDE 0.5 MG PO CAPS: 0.5000 mg | ORAL_CAPSULE | Freq: Every day | ORAL | 3 refills | Status: AC

## 2023-09-04 MED ORDER — TAMSULOSIN HCL 0.4 MG PO CAPS: 0.4000 mg | ORAL_CAPSULE | Freq: Two times a day (BID) | ORAL | 3 refills | Status: AC

## 2023-09-04 MED ORDER — CIPROFLOXACIN HCL 500 MG PO TABS
500.0000 mg | ORAL_TABLET | Freq: Once | ORAL | Status: AC
  Administered 2023-09-04: 500 mg via ORAL

## 2023-09-04 NOTE — Progress Notes (Unsigned)
 09/04/2023 9:15 AM   Eric [name] 1945-08-16 [id number]  Referring provider: Lari Elspeth BRAVO, MD [address] [place],  [place] [address]  No chief complaint on file.   HPI:    PMH: Past Medical History:  Diagnosis Date   Arthritis    Bladder cancer (HCC) 06/29/2016   2013   Complication of anesthesia    sometimes I am slow to awaken.   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   Neuropathy    Pneumonia 09/25/2020    Surgical History: Past Surgical History:  Procedure Laterality Date   BILATERAL CARPAL TUNNEL RELEASE Right 05/05/2023   Procedure: Carpal tunnel release--right;  Surgeon: Gillie Duncans, MD;  Location: [facility] OR;  Service: Neurosurgery;  Laterality: Right;   BIOPSY  09/29/2016   Procedure: BIOPSY;  Surgeon: Golda Claudis PENNER, MD;  Location: [facility];  Service: Endoscopy;;  colon   BIOPSY  11/14/2019   Procedure: BIOPSY;  Surgeon: Golda Claudis PENNER, MD;  Location: [facility];  Service: Endoscopy;;   BLADDER SURGERY     2013   BRONCHIAL BIOPSY  02/02/2021   Procedure: BRONCHIAL BIOPSIES;  Surgeon: Brenna Adine LITTIE, DO;  Location: [facility];  Service: Pulmonary;;   BRONCHIAL BRUSHINGS  02/02/2021   Procedure: BRONCHIAL BRUSHINGS;  Surgeon: Brenna Adine LITTIE, DO;  Location: [facility];  Service: Pulmonary;;   BRONCHIAL NEEDLE ASPIRATION BIOPSY  02/02/2021   Procedure: BRONCHIAL NEEDLE ASPIRATION BIOPSIES;  Surgeon: Brenna Adine LITTIE, DO;  Location: [facility];  Service: Pulmonary;;   BRONCHIAL WASHINGS  02/02/2021   Procedure: BRONCHIAL WASHINGS;  Surgeon: Brenna Adine LITTIE, DO;  Location: [facility];  Service: Pulmonary;;   CHOLECYSTECTOMY     COLONOSCOPY N/A 09/29/2016   Procedure: COLONOSCOPY;  Surgeon: Golda Claudis PENNER, MD;   Location: [facility];  Service: Endoscopy;  Laterality: N/A;  12:00   COLONOSCOPY N/A 11/14/2019   Procedure: COLONOSCOPY;  Surgeon: Golda Claudis PENNER, MD;  Location: [facility];  Service: Endoscopy;  Laterality: N/A;  125, per office, pt knows new arrival time   CRANIOTOMY N/A 04/24/2020   Procedure: 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;  Surgeon: Gillie Duncans, MD;  Location: [facility] OR;  Service: Neurosurgery;  Laterality: N/A;  3C/RM 20   ESOPHAGOGASTRODUODENOSCOPY N/A 11/14/2019   Procedure: ESOPHAGOGASTRODUODENOSCOPY (EGD);  Surgeon: Golda Claudis PENNER, MD;  Location: [facility];  Service: Endoscopy;  Laterality: N/A;   FIDUCIAL MARKER PLACEMENT  02/02/2021   Procedure: FIDUCIAL MARKER PLACEMENT;  Surgeon: Brenna Adine LITTIE, DO;  Location: [facility];  Service: Pulmonary;;   KNEE ARTHROSCOPY Right    TONSILLECTOMY     as child   TRANSPHENOIDAL APPROACH EXPOSURE N/A 04/24/2020   Procedure: TRANSPHENOIDAL APPROACH EXPOSURE;  Surgeon: Mable Lenis, MD;  Location: [facility] OR;  Service: ENT;  Laterality: N/A;   VIDEO BRONCHOSCOPY WITH RADIAL ENDOBRONCHIAL ULTRASOUND  02/02/2021   Procedure: RADIAL ENDOBRONCHIAL ULTRASOUND;  Surgeon: Brenna Adine LITTIE, DO;  Location: [facility];  Service: Pulmonary;;    Home Medications:  Allergies as of 09/04/2023       Reactions   Metformin And Related Other (See Comments)   Intolerance- caused bladder cancer        Medication List  Accurate as of September 04, 2023  9:15 AM. If you have any questions, ask your nurse or doctor.          B-complex with vitamin C tablet Take 1 tablet by mouth daily.   CoQ10 200 MG Caps Take 200 mg by mouth daily.   dutasteride  0.5 MG capsule Commonly known as: AVODART  take 1 capsule daily   Flaxseed Oil 1000 MG Caps Take 1,000 mg by mouth daily.   gabapentin  400 MG capsule Commonly known as: NEURONTIN  Take 400 mg by mouth 3 (three) times daily.   Gemtesa  75  MG Tabs Generic drug: Vibegron  Take 1 tablet (75 mg total) by mouth daily.   lisinopril  10 MG tablet Commonly known as: ZESTRIL  Take 10 mg by mouth daily.   multivitamin with minerals Tabs tablet Take 1 tablet by mouth daily.   OVER THE COUNTER MEDICATION Take 1 capsule by mouth daily. Cellwise   OVER THE COUNTER MEDICATION Take 1 capsule by mouth daily. Blueberry   OVER THE COUNTER MEDICATION Take 1 tablet by mouth daily. Replenex Extra Strength   OVER THE COUNTER MEDICATION Take 1 tablet by mouth daily. Acid-A-Cal   psyllium 58.6 % powder Commonly known as: METAMUCIL Take 1 packet by mouth daily.   rosuvastatin 10 MG tablet Commonly known as: CRESTOR Take 10 mg by mouth daily.   tamsulosin  0.4 MG Caps capsule Commonly known as: FLOMAX  Take 1 capsule (0.4 mg total) by mouth 2 (two) times daily.   vitamin C 1000 MG tablet Take 1,000 mg by mouth daily.   Vitamin D3 50 MCG (2000 UT) Tabs Take 2,000 Units by mouth daily.   VITAMIN K2-VITAMIN D3 PO Take 1 tablet by mouth daily.   Zinc  50 MG Tabs Take 50 mg by mouth daily.   zolpidem 5 MG tablet Commonly known as: AMBIEN Take 5 mg by mouth at bedtime as needed for sleep.        Allergies:  Allergies  Allergen Reactions   Metformin And Related Other (See Comments)    Intolerance- caused bladder cancer    Family History: Family History  Problem Relation Age of Onset   Hypercholesterolemia Father    Hypercholesterolemia Mother    Colon cancer Neg Hx    Urolithiasis Neg Hx    Sudden death Neg Hx    Lupus Neg Hx    Sickle cell trait Neg Hx     Social History:  reports that he quit smoking about 65 years ago. His smoking use included cigarettes. He started smoking about 66 years ago. He has a 0.3 pack-year smoking history. He has never been exposed to tobacco smoke. He quit smokeless tobacco use about 26 years ago.  His smokeless tobacco use included chew. He reports that he does not currently use  alcohol . He reports that he does not use drugs.  ROS: All other review of systems were reviewed and are negative except what is noted above in HPI  Physical Exam: BP (!) 163/72   Pulse 80   Constitutional:  Alert and oriented, No acute distress. HEENT: [place] AT, moist mucus membranes.  Trachea midline, no masses. Cardiovascular: No clubbing, cyanosis, or edema. Respiratory: Normal respiratory effort, no increased work of breathing. GI: Abdomen is soft, nontender, nondistended, no abdominal masses GU: No CVA tenderness.  Lymph: No cervical or inguinal lymphadenopathy. Skin: No rashes, bruises or suspicious lesions. Neurologic: Grossly intact, no focal deficits, moving all 4 extremities. Psychiatric: Normal mood and affect.  Laboratory Data: Lab Results  Component Value Date   WBC 6.1 05/02/2023   HGB 11.9 (L) 05/02/2023   HCT 35.5 (L) 05/02/2023   MCV 87.9 05/02/2023   PLT 147 (L) 05/02/2023    Lab Results  Component Value Date   CREATININE 1.07 05/02/2023    No results found for: PSA  No results found for: TESTOSTERONE  Lab Results  Component Value Date   HGBA1C 6.5 (H) 09/26/2020    Urinalysis    Component Value Date/Time   COLORURINE YELLOW 09/26/2020 2230   APPEARANCEUR Clear 09/07/2022 1501   LABSPEC 1.005 09/26/2020 2230   PHURINE 6.0 09/26/2020 2230   GLUCOSEU Negative 09/07/2022 1501   HGBUR SMALL (A) 09/26/2020 2230   BILIRUBINUR Negative 09/07/2022 1501   KETONESUR NEGATIVE 09/26/2020 2230   PROTEINUR Negative 09/07/2022 1501   PROTEINUR NEGATIVE 09/26/2020 2230   NITRITE Negative 09/07/2022 1501   NITRITE NEGATIVE 09/26/2020 2230   LEUKOCYTESUR Negative 09/07/2022 1501   LEUKOCYTESUR NEGATIVE 09/26/2020 2230    Lab Results  Component Value Date   LABMICR Comment 09/07/2022   WBCUA None seen 07/20/2021   LABEPIT None seen 07/20/2021   BACTERIA None seen 07/20/2021    Pertinent Imaging: *** No results found for this or any previous  visit.  No results found for this or any previous visit.  No results found for this or any previous visit.  No results found for this or any previous visit.  No results found for this or any previous visit.  No results found for this or any previous visit.  No results found for this or any previous visit.  No results found for this or any previous visit.    09/04/23  CC: No chief complaint on file.   HPI:  Blood pressure (!) 163/72, pulse 80. NED. A&Ox3.   No respiratory distress   Abd soft, NT, ND Normal phallus with bilateral descended testicles  Cystoscopy Procedure Note  Patient identification was confirmed, informed consent was obtained, and patient was prepped using Betadine solution.  Lidocaine  jelly was administered per urethral meatus.     Pre-Procedure: - Inspection reveals a normal caliber ureteral meatus.  Procedure: The flexible cystoscope was introduced without difficulty - No urethral strictures/lesions are present. - {Blank multiple:19197::Enlarged,Surgically absent,Normal} prostate *** - {Blank multiple:19197::Normal,Elevated,Tight} bladder neck - Bilateral ureteral orifices identified - Bladder mucosa  reveals no ulcers, tumors, or lesions - No bladder stones - No trabeculation  Retroflexion shows ***   Post-Procedure: - Patient tolerated the procedure well  Assessment/ Plan:   No follow-ups on file.  Belvie Clara, MD   Assessment & Plan:    1. Malignant neoplasm of urinary bladder, unspecified site (HCC) (Primary) *** - Urinalysis, Routine w reflex microscopic - Cystoscopy (Bedside) - ciprofloxacin  (CIPRO ) tablet 500 mg  2. Benign prostatic hyperplasia with urinary obstruction ***  3. Incomplete emptying of bladder ***   No follow-ups on file.  Belvie Clara, MD  [facility] 

## 2023-09-05 ENCOUNTER — Encounter: Payer: Self-pay | Admitting: Urology

## 2023-09-05 NOTE — Patient Instructions (Signed)

## 2023-09-11 DIAGNOSIS — H401212 Low-tension glaucoma, right eye, moderate stage: Secondary | ICD-10-CM | POA: Diagnosis not present

## 2023-09-18 ENCOUNTER — Other Ambulatory Visit (HOSPITAL_COMMUNITY): Payer: Self-pay | Admitting: Family Medicine

## 2023-09-18 DIAGNOSIS — R911 Solitary pulmonary nodule: Secondary | ICD-10-CM

## 2023-10-07 ENCOUNTER — Ambulatory Visit (HOSPITAL_COMMUNITY)
Admission: RE | Admit: 2023-10-07 | Discharge: 2023-10-07 | Disposition: A | Discharge status: Home / Self Care | Source: Ambulatory Visit | Attending: Family Medicine | Admitting: Family Medicine

## 2023-10-07 DIAGNOSIS — R911 Solitary pulmonary nodule: Secondary | ICD-10-CM | POA: Insufficient documentation

## 2023-10-07 DIAGNOSIS — R918 Other nonspecific abnormal finding of lung field: Secondary | ICD-10-CM | POA: Diagnosis not present

## 2023-10-07 DIAGNOSIS — I700 Atherosclerosis of aorta: Secondary | ICD-10-CM | POA: Diagnosis not present

## 2023-10-24 DIAGNOSIS — H401221 Low-tension glaucoma, left eye, mild stage: Secondary | ICD-10-CM | POA: Diagnosis not present

## 2023-12-22 DIAGNOSIS — Z13220 Encounter for screening for lipoid disorders: Secondary | ICD-10-CM | POA: Diagnosis not present

## 2023-12-22 DIAGNOSIS — E1165 Type 2 diabetes mellitus with hyperglycemia: Secondary | ICD-10-CM | POA: Diagnosis not present

## 2023-12-22 DIAGNOSIS — R7989 Other specified abnormal findings of blood chemistry: Secondary | ICD-10-CM | POA: Diagnosis not present

## 2023-12-28 DIAGNOSIS — M5412 Radiculopathy, cervical region: Secondary | ICD-10-CM | POA: Diagnosis not present

## 2023-12-28 DIAGNOSIS — Z0001 Encounter for general adult medical examination with abnormal findings: Secondary | ICD-10-CM | POA: Diagnosis not present

## 2023-12-28 DIAGNOSIS — I10 Essential (primary) hypertension: Secondary | ICD-10-CM | POA: Diagnosis not present

## 2023-12-28 DIAGNOSIS — Z6823 Body mass index (BMI) 23.0-23.9, adult: Secondary | ICD-10-CM | POA: Diagnosis not present

## 2023-12-28 DIAGNOSIS — D696 Thrombocytopenia, unspecified: Secondary | ICD-10-CM | POA: Diagnosis not present

## 2023-12-28 DIAGNOSIS — I700 Atherosclerosis of aorta: Secondary | ICD-10-CM | POA: Diagnosis not present

## 2024-01-01 DIAGNOSIS — G5601 Carpal tunnel syndrome, right upper limb: Secondary | ICD-10-CM | POA: Diagnosis not present

## 2024-01-01 DIAGNOSIS — G5602 Carpal tunnel syndrome, left upper limb: Secondary | ICD-10-CM | POA: Diagnosis not present

## 2024-02-20 DIAGNOSIS — E1142 Type 2 diabetes mellitus with diabetic polyneuropathy: Secondary | ICD-10-CM | POA: Diagnosis not present

## 2024-02-26 DIAGNOSIS — H43812 Vitreous degeneration, left eye: Secondary | ICD-10-CM | POA: Diagnosis not present

## 2024-09-02 ENCOUNTER — Other Ambulatory Visit: Admitting: Urology
